# Patient Record
Sex: Female | Born: 1940 | Race: White | Hispanic: No | Marital: Married | Smoking: Former smoker
Health system: Southern US, Community
[De-identification: ages and names within clinical notes are randomized; demographics above are authoritative.]

## PROBLEM LIST (undated history)

## (undated) DIAGNOSIS — M069 Rheumatoid arthritis, unspecified: Secondary | ICD-10-CM

## (undated) DIAGNOSIS — M19042 Primary osteoarthritis, left hand: Secondary | ICD-10-CM

## (undated) DIAGNOSIS — M8580 Other specified disorders of bone density and structure, unspecified site: Secondary | ICD-10-CM

## (undated) DIAGNOSIS — M19041 Primary osteoarthritis, right hand: Secondary | ICD-10-CM

## (undated) DIAGNOSIS — M810 Age-related osteoporosis without current pathological fracture: Secondary | ICD-10-CM

## (undated) HISTORY — DX: Primary osteoarthritis, left hand: M19.042

## (undated) HISTORY — DX: Primary osteoarthritis, right hand: M19.041

## (undated) HISTORY — DX: Rheumatoid arthritis, unspecified: M06.9

## (undated) HISTORY — PX: CHOLECYSTECTOMY: SHX55

## (undated) HISTORY — DX: Age-related osteoporosis without current pathological fracture: M81.0

## (undated) HISTORY — DX: Other specified disorders of bone density and structure, unspecified site: M85.80

---

## 2005-10-18 ENCOUNTER — Encounter: Admission: RE | Admit: 2005-10-18 | Discharge: 2005-10-18 | Payer: Self-pay | Admitting: Rheumatology

## 2007-04-03 IMAGING — CR DG CHEST 2V
2 series · 2 of 2 positions shown · non-contrast
Comparison: none

CLINICAL DATA: Short of breath.  Rheumatoid arthritis.  Assess for possible tuberculosis.
 CHEST, TWO VIEWS:
 The heart and mediastinum are normal.  Lungs are clear.  No effusions.  No bony abnormalities.  No sign of old or active tuberculosis.

[w chest pa]
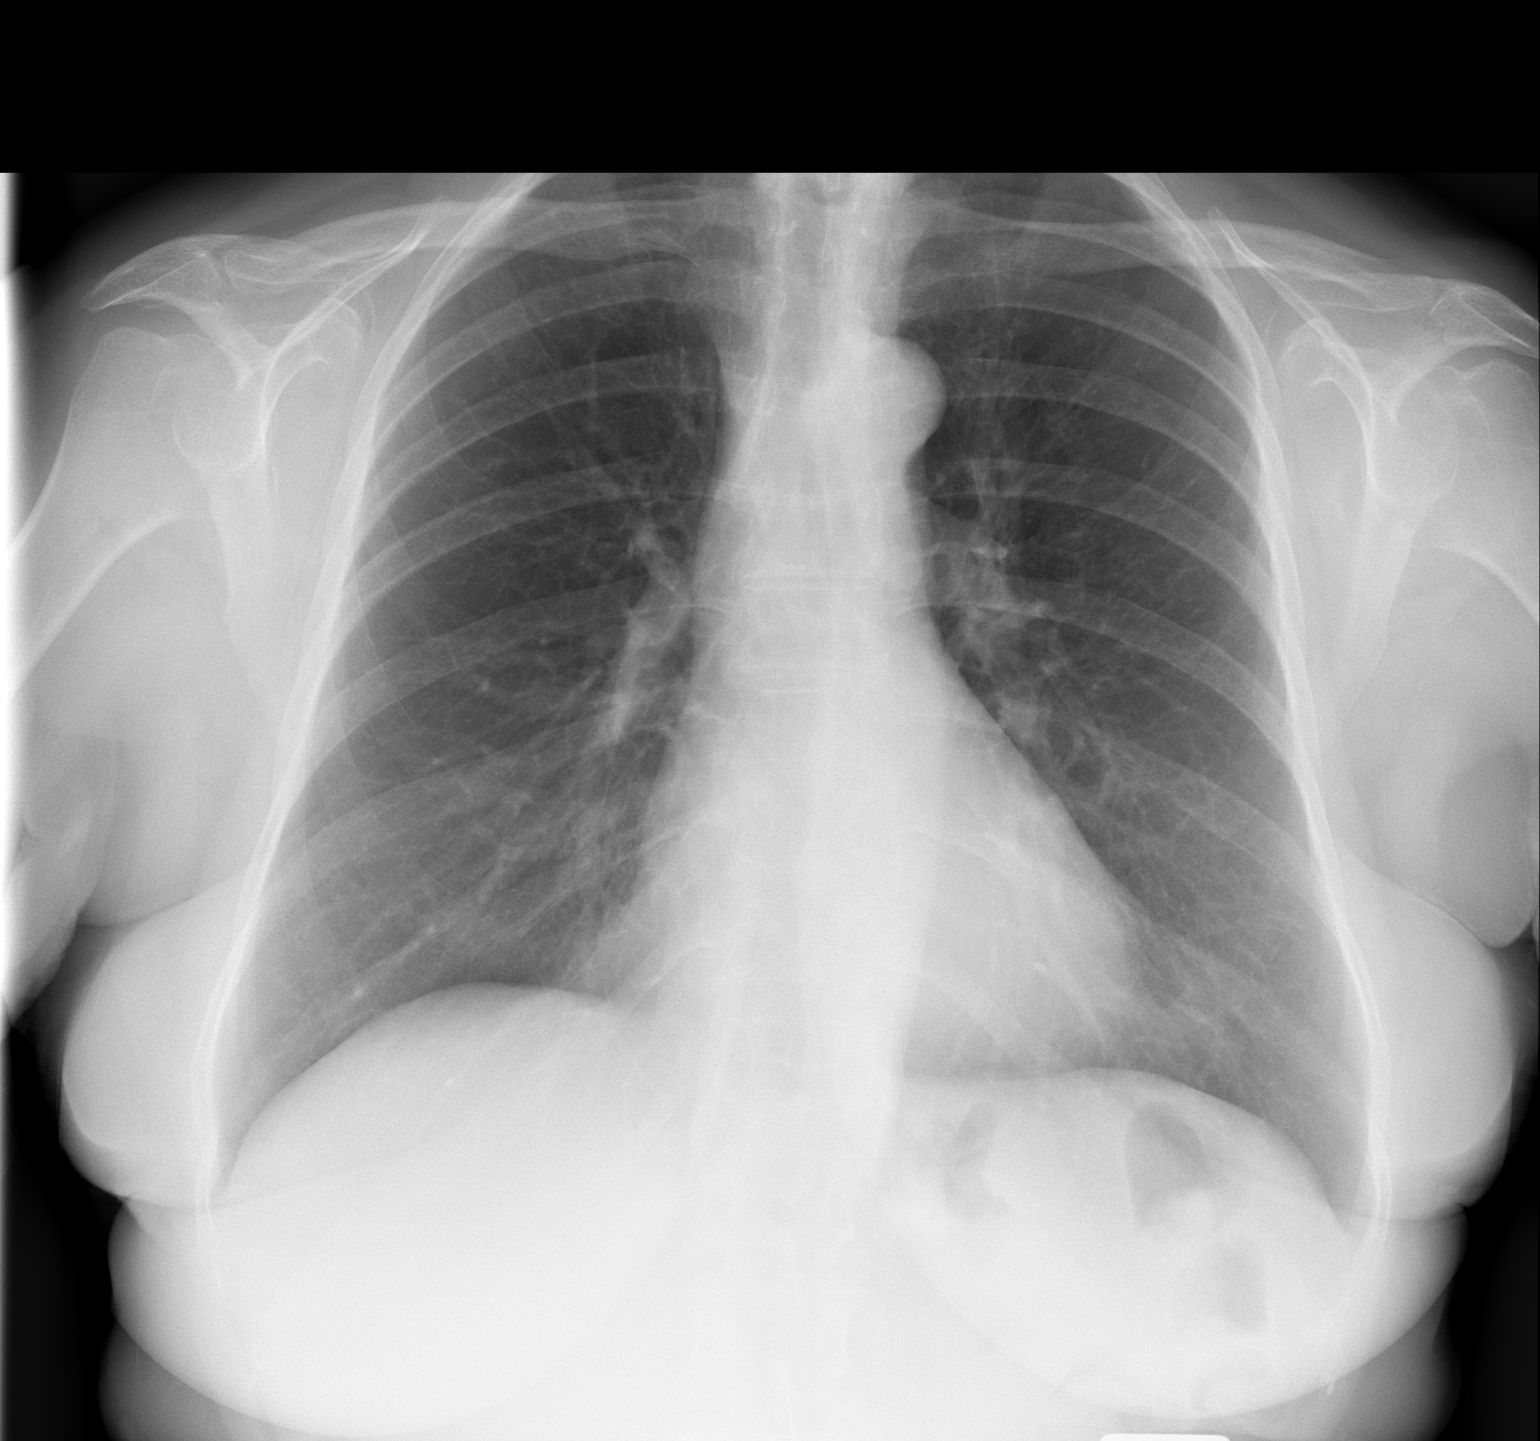

[w chest lat]
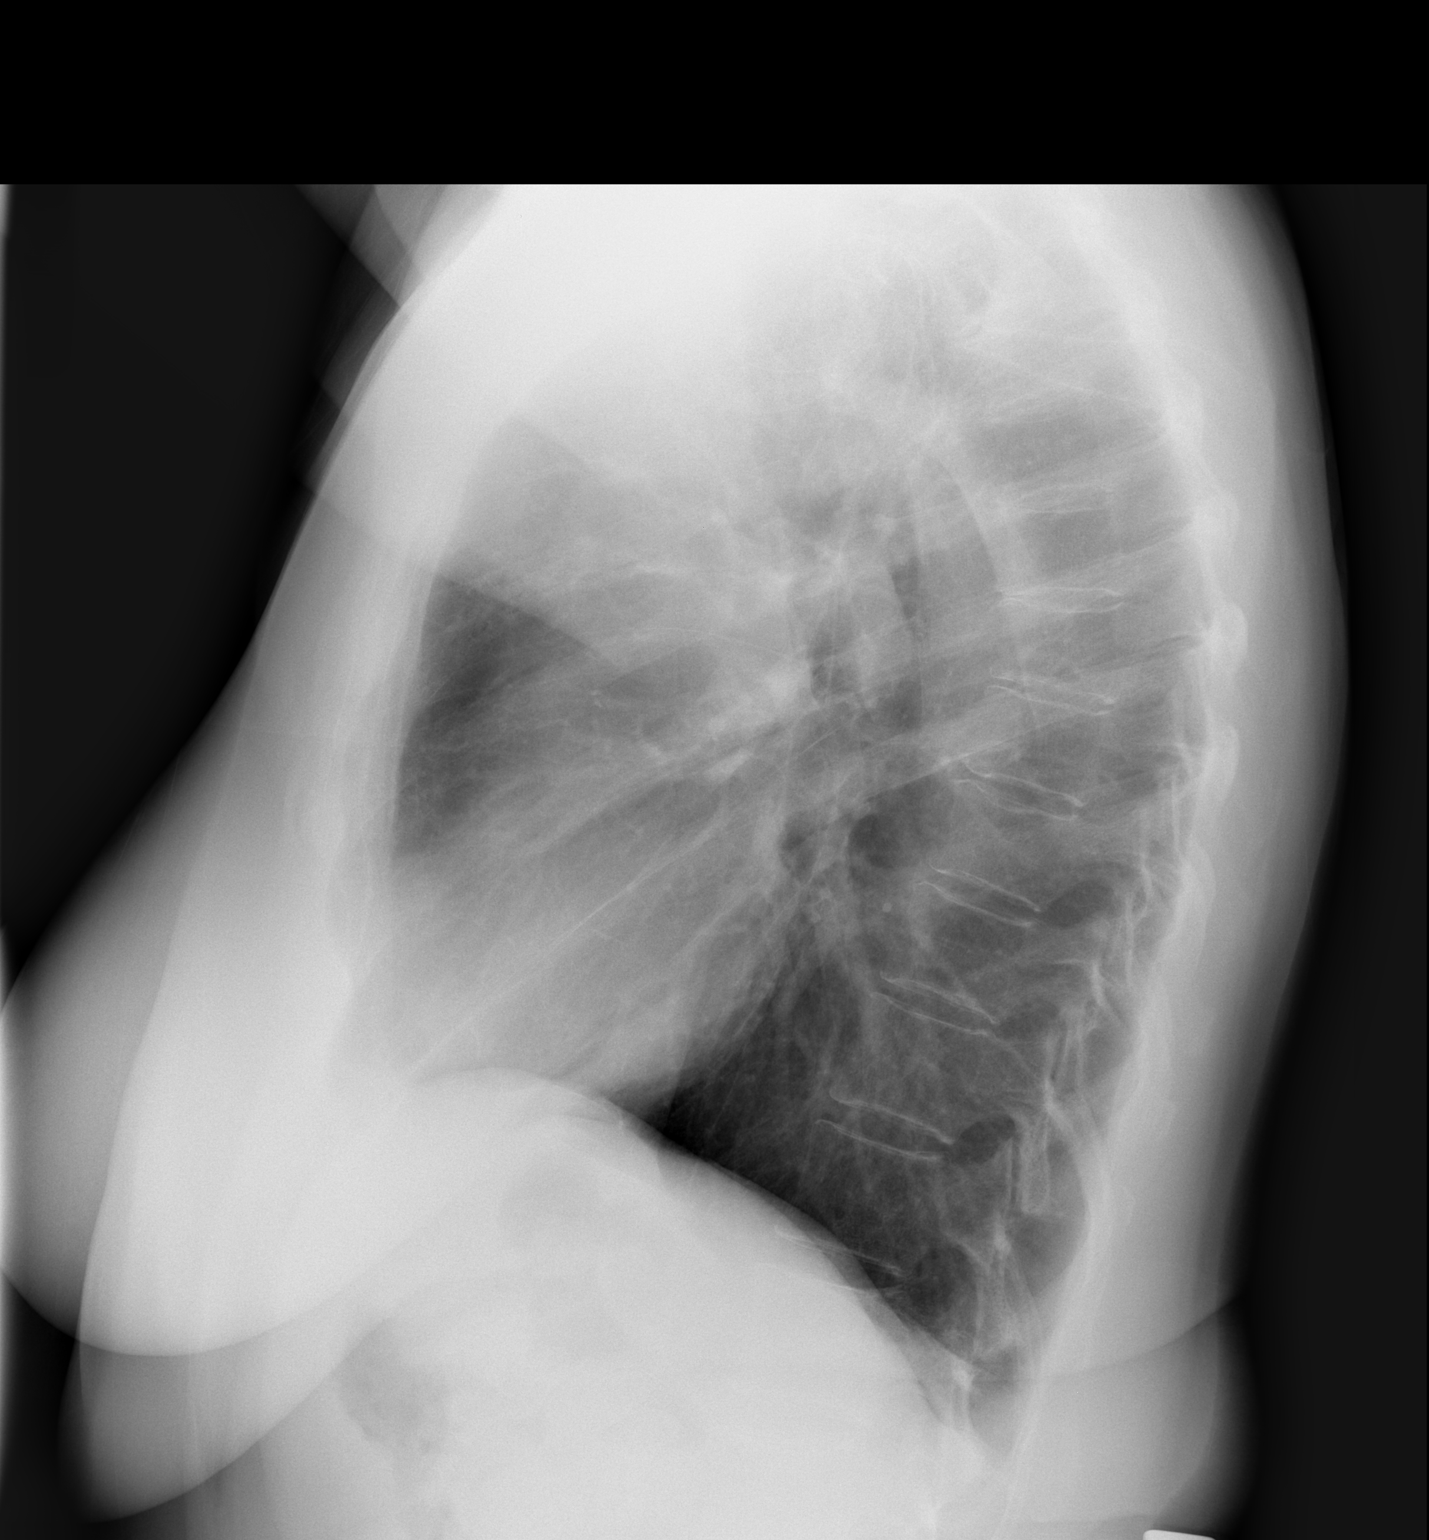

[2 of 2 positions shown; findings below may reference images not displayed]

IMPRESSION: Normal chest.

## 2016-02-20 LAB — HEPATIC FUNCTION PANEL
ALT: 21 U/L (ref 7–35)
AST: 28 U/L (ref 13–35)
Alkaline Phosphatase: 70 U/L (ref 25–125)
Bilirubin, Total: 0.4 mg/dL

## 2016-02-20 LAB — BASIC METABOLIC PANEL
BUN: 13 mg/dL (ref 4–21)
Creatinine: 0.9 mg/dL (ref 0.5–1.1)
Glucose: 95 mg/dL
Potassium: 4.6 mmol/L (ref 3.4–5.3)
Sodium: 138 mmol/L (ref 137–147)

## 2016-05-13 ENCOUNTER — Encounter: Payer: Self-pay | Admitting: *Deleted

## 2016-05-13 DIAGNOSIS — M19042 Primary osteoarthritis, left hand: Secondary | ICD-10-CM

## 2016-05-13 DIAGNOSIS — M8580 Other specified disorders of bone density and structure, unspecified site: Secondary | ICD-10-CM

## 2016-05-13 DIAGNOSIS — M19041 Primary osteoarthritis, right hand: Secondary | ICD-10-CM | POA: Insufficient documentation

## 2016-05-13 DIAGNOSIS — M069 Rheumatoid arthritis, unspecified: Secondary | ICD-10-CM

## 2016-05-13 HISTORY — DX: Primary osteoarthritis, right hand: M19.041

## 2016-05-13 HISTORY — DX: Other specified disorders of bone density and structure, unspecified site: M85.80

## 2016-05-13 HISTORY — DX: Primary osteoarthritis, left hand: M19.042

## 2016-05-13 HISTORY — DX: Rheumatoid arthritis, unspecified: M06.9

## 2016-05-14 ENCOUNTER — Ambulatory Visit (INDEPENDENT_AMBULATORY_CARE_PROVIDER_SITE_OTHER): Payer: MEDICARE | Admitting: Rheumatology

## 2016-05-14 ENCOUNTER — Encounter: Payer: Self-pay | Admitting: Rheumatology

## 2016-05-14 VITALS — BP 143/70 | HR 72 | Resp 14 | Ht 63.0 in | Wt 150.0 lb

## 2016-05-14 DIAGNOSIS — M06041 Rheumatoid arthritis without rheumatoid factor, right hand: Secondary | ICD-10-CM

## 2016-05-14 DIAGNOSIS — M06042 Rheumatoid arthritis without rheumatoid factor, left hand: Secondary | ICD-10-CM

## 2016-05-14 DIAGNOSIS — M19079 Primary osteoarthritis, unspecified ankle and foot: Secondary | ICD-10-CM | POA: Diagnosis not present

## 2016-05-14 DIAGNOSIS — M65341 Trigger finger, right ring finger: Secondary | ICD-10-CM

## 2016-05-14 DIAGNOSIS — M19042 Primary osteoarthritis, left hand: Secondary | ICD-10-CM | POA: Diagnosis not present

## 2016-05-14 DIAGNOSIS — M19041 Primary osteoarthritis, right hand: Secondary | ICD-10-CM | POA: Diagnosis not present

## 2016-05-14 DIAGNOSIS — Z79899 Other long term (current) drug therapy: Secondary | ICD-10-CM

## 2016-05-14 LAB — CBC WITH DIFFERENTIAL/PLATELET
Basophils Absolute: 62 cells/uL (ref 0–200)
Basophils Relative: 1 %
Eosinophils Absolute: 124 cells/uL (ref 15–500)
Eosinophils Relative: 2 %
HCT: 39.9 % (ref 35.0–45.0)
Hemoglobin: 13.5 g/dL (ref 11.7–15.5)
Lymphocytes Relative: 28 %
Lymphs Abs: 1736 cells/uL (ref 850–3900)
MCH: 32 pg (ref 27.0–33.0)
MCHC: 33.8 g/dL (ref 32.0–36.0)
MCV: 94.5 fL (ref 80.0–100.0)
MPV: 8.4 fL (ref 7.5–12.5)
Monocytes Absolute: 434 cells/uL (ref 200–950)
Monocytes Relative: 7 %
Neutro Abs: 3844 cells/uL (ref 1500–7800)
Neutrophils Relative %: 62 %
Platelets: 302 10*3/uL (ref 140–400)
RBC: 4.22 MIL/uL (ref 3.80–5.10)
RDW: 13.7 % (ref 11.0–15.0)
WBC: 6.2 10*3/uL (ref 3.8–10.8)

## 2016-05-14 LAB — COMPLETE METABOLIC PANEL WITH GFR
ALT: 23 U/L (ref 6–29)
AST: 29 U/L (ref 10–35)
Albumin: 4.5 g/dL (ref 3.6–5.1)
Alkaline Phosphatase: 59 U/L (ref 33–130)
BUN: 11 mg/dL (ref 7–25)
CO2: 28 mmol/L (ref 20–31)
Calcium: 10.2 mg/dL (ref 8.6–10.4)
Chloride: 100 mmol/L (ref 98–110)
Creat: 0.77 mg/dL (ref 0.60–0.93)
GFR, Est African American: 87 mL/min (ref >=60)
GFR, Est Non African American: 76 mL/min (ref >=60)
Glucose, Bld: 89 mg/dL (ref 65–99)
Potassium: 4.1 mmol/L (ref 3.5–5.3)
Sodium: 137 mmol/L (ref 135–146)
Total Bilirubin: 0.5 mg/dL (ref 0.2–1.2)
Total Protein: 6.9 g/dL (ref 6.1–8.1)

## 2016-05-14 MED ORDER — DICLOFENAC SODIUM 1 % TD GEL: TRANSDERMAL | 3 refills | Status: DC

## 2016-05-14 MED ORDER — METHOTREXATE 2.5 MG PO TABS: 20.0000 mg | ORAL_TABLET | ORAL | 2 refills | Status: DC

## 2016-05-14 MED ORDER — FOLIC ACID 1 MG PO TABS: 2.0000 mg | ORAL_TABLET | Freq: Every day | ORAL | 4 refills | Status: DC

## 2016-05-14 NOTE — Progress Notes (Signed)
Office Visit Note  Patient: April Shepard             Date of Birth: 1941/05/31           MRN: [id number]             PCP: Pcp Not In System Referring: No ref. provider found Visit Date: 05/14/2016 Occupation: @GUAROCC @    Subjective:  No chief complaint on file. Rheumatoid arthritis and high risk prescription and Alway of the hands and feet  History of Present Illness: April Shepard is a 75 y.o. female  Last seen 11/21/2015 Doing well with the rheumatoid arthritis Taking methotrexate 8 pills per week and folic acid 2 every day. 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 Having OA hands and feet stiffness. Has done well with osteopenia and is off of Fosamax Sees Dr. in 01/21/2016 to get her DEXA done. Her last labs from [facility] only had CMP. Patient knows that she had CBC with differential done and there is supposed to send [facility] a copy but we have not received it Line right trigger finger doing well after getting a cortisone injection by Dr. [place] the visit before last. But now it started to bother her. Patient was advised to use Voltaren gel.   Activities of Daily Living:  Patient reports morning stiffness for 15 minute.   Patient Denies nocturnal pain.  Difficulty dressing/grooming: Denies Difficulty climbing stairs: Denies Difficulty getting out of chair: Denies Difficulty using hands for taps, buttons, cutlery, and/or writing: Denies   Review of Systems  Constitutional: Negative for fatigue.  HENT: Negative for mouth sores and mouth dryness.   Eyes: Negative for dryness.  Respiratory: Negative for shortness of breath.   Gastrointestinal: Negative for constipation and diarrhea.  Musculoskeletal: Negative for myalgias and myalgias.  Skin: Negative for sensitivity to sunlight.  Psychiatric/Behavioral: Negative for decreased concentration and sleep disturbance.    PMFS History:  Patient Active Problem List   Diagnosis Date Noted  .  Osteopenia 05/13/2016  . Osteoarthritis of both hands 05/13/2016  . RA (rheumatoid arthritis) (HCC) 05/13/2016    Past Medical History:  Diagnosis Date  . Osteoarthritis of both hands 05/13/2016  . Osteopenia 05/13/2016  . RA (rheumatoid arthritis) (HCC) 05/13/2016    Family History  Problem Relation Age of Onset  . Breast cancer Mother   . Melanoma Brother    History reviewed. No pertinent surgical history. Social History   Social History Narrative  . No narrative on file     Objective: Vital Signs: BP (!) 143/70 (BP Location: Left Arm, Patient Position: Sitting, Cuff Size: Large)   Pulse 72   Resp 14   Ht 5\' 3"  (1.6 m)   Wt 150 lb (68 kg)   BMI 26.57 kg/m    Physical Exam  Constitutional: She is oriented to person, place, and time. She appears well-developed and well-nourished.  HENT:  Head: Normocephalic and atraumatic.  Eyes: EOM are normal. Pupils are equal, round, and reactive to light.  Cardiovascular: Normal rate, regular rhythm and normal heart sounds.  Exam reveals no gallop and no friction rub.   No murmur heard. Pulmonary/Chest: Effort normal and breath sounds normal. She has no wheezes. She has no rales.  Abdominal: Soft. Bowel sounds are normal. She exhibits no distension. There is no tenderness. There is no guarding. No hernia.  Musculoskeletal: Normal range of motion. She exhibits no edema, tenderness or deformity.  Lymphadenopathy:    She has no  cervical adenopathy.  Neurological: She is alert and oriented to person, place, and time. Coordination normal.  Skin: Skin is warm and dry. Capillary refill takes less than 2 seconds. No rash noted.  Psychiatric: She has a normal mood and affect. Her behavior is normal.     Musculoskeletal Exam:    CDAI Exam: CDAI Homunculus Exam:   Joint Counts:  CDAI Tender Joint count: 0 CDAI Swollen Joint count: 0  Global Assessments:  Patient Global Assessment: 3 Provider Global Assessment: 3  CDAI  Calculated Score: 6    Investigation: No additional findings. Labs from September 2017 from [facility] shows CMP with GFR normal. Patient did have CBC with differential done but we do not have a copy of those results despite our best efforts to get a copy.   Imaging: No results found.  Speciality Comments: No specialty comments available.    Procedures:  No procedures performed Allergies: Patient has no known allergies.   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 DISCOMFORT===> WILL USE VOLTAREN GEL PRN.  Refill methotrexate 8 per week 32 pills with 2 refills Refill folic acid 2 mg daily ninety-day supply with 4 refills CBC with differential CMP with GFR every 3 months starting for break 2018 We did labs in our office today.  Orders: Orders Placed This Encounter  Procedures  . CBC with Differential/Platelet  . COMPLETE METABOLIC PANEL WITH GFR   Meds ordered this encounter  Medications  . diclofenac sodium (VOLTAREN) 1 % GEL    Sig: Voltaren Gel 3 grams to 3 large joints upto TID 3 TUBES with 3 refills    Dispense:  3 Tube    Refill:  3    Order Specific Question:   Supervising Provider    Answer:   Pollyann Savoy [[id number]]  . methotrexate (RHEUMATREX) 2.5 MG tablet    Sig: Take 8 tablets (20 mg total) by mouth once a week. Caution:Chemotherapy. Protect from light.    Dispense:  32 tablet    Refill:  2    Order Specific Question:   Supervising Provider    Answer:   Pollyann Savoy [[id number]]  . folic acid (FOLVITE) 1 MG tablet    Sig: Take 2 tablets (2 mg total)  by mouth daily.    Dispense:  180 tablet    Refill:  4    Order Specific Question:   Supervising Provider    Answer:   Pollyann Savoy [phone]    Face-to-face time spent with patient was 30 minutes. 50% of time was spent in counseling and coordination of care.  Follow-Up Instructions: Return in about 5 months (around 10/12/2016) for RA, HRRX, MTX, OFF PLQ,,.   Chauncey Bruno, PA-C I examined and evaluated the patient with Tawni Pummel PA. The plan of care was discussed as noted above.  Pollyann Savoy, MD

## 2016-05-15 ENCOUNTER — Telehealth: Payer: Self-pay | Admitting: Radiology

## 2016-05-15 NOTE — Progress Notes (Signed)
#  1 CMP with GFR is normal#2 CBC with differential is normalNo change in treatment. Please advise patient.

## 2016-05-15 NOTE — Telephone Encounter (Signed)
I have called patient to advise labs are normal  

## 2016-06-06 ENCOUNTER — Telehealth: Payer: Self-pay | Admitting: Rheumatology

## 2016-06-06 MED ORDER — PREDNISONE 5 MG PO TABS: ORAL_TABLET | ORAL | 0 refills | Status: DC

## 2016-06-06 NOTE — Telephone Encounter (Signed)
Advised patient prescription sent to the pharmacy.

## 2016-06-06 NOTE — Telephone Encounter (Signed)
Patient states she is having swelling and soreness in her knee and ankle. Patient is requesting a prescription for prednisone.

## 2016-06-06 NOTE — Telephone Encounter (Signed)
Prednisone 5 mg tablets, 4 for 2 days 3 for 2 days 2 for 2 days 1 for 2 days

## 2016-06-06 NOTE — Addendum Note (Signed)
Addended by: Henriette Combs on: 06/06/2016 01:22 PM   Modules accepted: Orders

## 2016-06-06 NOTE — Telephone Encounter (Signed)
Patient is requesting rx of Prednisone be sent to [facility] in [place]. She states she is having some swelling so she is requesting prednisone.

## 2016-08-20 ENCOUNTER — Encounter: Payer: Self-pay | Admitting: Rheumatology

## 2016-08-22 ENCOUNTER — Telehealth: Payer: Self-pay | Admitting: Rheumatology

## 2016-08-22 NOTE — Telephone Encounter (Signed)
Patient advised lab results have not been received yet. Patient is going to have them refaxed, Patient provided with fax number again.

## 2016-08-22 NOTE — Telephone Encounter (Signed)
Patient would like to know if we received labs from [facility] in [place]? Patient had them faxed over and is requesting a call back please. Thanks.

## 2016-08-28 ENCOUNTER — Telehealth: Payer: Self-pay | Admitting: Rheumatology

## 2016-08-28 NOTE — Telephone Encounter (Signed)
Patient called wanting to know if we have received her lab results.  OF#[phone].  Thank you

## 2016-08-28 NOTE — Telephone Encounter (Signed)
Patient advised CBC and CMP drawn on 07/25/16 are WNL. Lipid panel show slightly elevated HDL and slightly elevated total cholesterol. Patient verbalized understanding.

## 2016-09-05 ENCOUNTER — Telehealth: Payer: Self-pay | Admitting: Rheumatology

## 2016-09-05 NOTE — Telephone Encounter (Signed)
OK to Prescribe Prednisone 5mg:  4po qAM x 3 days,  3po qAM x 3 days, 2po qAM x 3 days, 1po qAM x 3 days, 1/2po qAM x 3 days, then stop. disp 32 pills w/ no refills.

## 2016-09-05 NOTE — Telephone Encounter (Signed)
Patient states she is having swelling in her right foot. Patient states there is no pain. Patient states the swelling has been there for about 1.5 weeks. Patient is requesting a prescription for prednisone.

## 2016-09-05 NOTE — Telephone Encounter (Signed)
Patient says the prednisone helped the swelling in her right foot but now the swelling is back. She is requesting a prednisone rx be sent to [facility] pharmacy in [place].

## 2016-09-06 MED ORDER — PREDNISONE 5 MG PO TABS: ORAL_TABLET | ORAL | 0 refills | Status: DC

## 2016-09-06 NOTE — Telephone Encounter (Signed)
Left message to advise patient prescription sent to the pharmacy.  °

## 2016-09-23 ENCOUNTER — Other Ambulatory Visit: Payer: Self-pay | Admitting: Rheumatology

## 2016-09-23 NOTE — Telephone Encounter (Signed)
Last Visit: 05/14/16 Next Visit: 10/23/16 Labs: 07/25/16 WNL  Okay to refill MTX?

## 2016-10-22 DIAGNOSIS — Z79899 Other long term (current) drug therapy: Secondary | ICD-10-CM | POA: Insufficient documentation

## 2016-10-22 DIAGNOSIS — T372X5A Adverse effect of antimalarials and drugs acting on other blood protozoa, initial encounter: Secondary | ICD-10-CM

## 2016-10-22 DIAGNOSIS — H35389 Toxic maculopathy, unspecified eye: Secondary | ICD-10-CM | POA: Insufficient documentation

## 2016-10-22 DIAGNOSIS — M65341 Trigger finger, right ring finger: Secondary | ICD-10-CM | POA: Insufficient documentation

## 2016-10-22 NOTE — Progress Notes (Signed)
Office Visit Note  Patient: April Shepard             Date of Birth: 1940/12/15           MRN: [id number]             PCP: System, Pcp Not In Referring: No ref. provider found Visit Date: 10/23/2016 Occupation: '@GUAROCC' @    Subjective:  Pain and swelling in right foot   History of Present Illness: April Shepard is a 76 y.o. female with history of rheumatoid arthritis. According to her she had an episode of increased pain and swelling in her right foot joint in January for which she had to take a course of prednisone. She states the swelling responded to the course of prednisone. She had another episode in March for which also she got prednisone and the symptoms improved. She's been off prednisone for a month now. She states she still have some residual swelling in her right foot. None of the other joints have been swollen.  Activities of Daily Living:  Patient reports morning stiffness for 30 minutes.   Patient Denies nocturnal pain.  Difficulty dressing/grooming: Denies Difficulty climbing stairs: Denies Difficulty getting out of chair: Denies Difficulty using hands for taps, buttons, cutlery, and/or writing: Denies   Review of Systems  Constitutional: Positive for fatigue. Negative for night sweats, weight gain, weight loss and weakness.  HENT: Negative for mouth sores, trouble swallowing, trouble swallowing, mouth dryness and nose dryness.   Eyes: Negative for pain, redness, visual disturbance and dryness.  Respiratory: Negative for cough, shortness of breath and difficulty breathing.   Cardiovascular: Negative for chest pain, palpitations, hypertension, irregular heartbeat and swelling in legs/feet.  Gastrointestinal: Negative for blood in stool, constipation and diarrhea.  Endocrine: Negative for increased urination.  Genitourinary: Negative for vaginal dryness.  Musculoskeletal: Positive for arthralgias, joint pain and joint swelling. Negative for myalgias,  muscle weakness, morning stiffness, muscle tenderness and myalgias.  Skin: Negative for color change, rash, hair loss, skin tightness, ulcers and sensitivity to sunlight.  Allergic/Immunologic: Negative for susceptible to infections.  Neurological: Negative for dizziness, memory loss and night sweats.  Hematological: Negative for swollen glands.  Psychiatric/Behavioral: Negative for depressed mood and sleep disturbance. The patient is not nervous/anxious.     PMFS History:  Patient Active Problem List   Diagnosis Date Noted  . 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 High risk medication use 10/22/2016  . Trigger finger, right ring finger 10/22/2016  . Osteopenia 05/13/2016  . Osteoarthritis of both hands 05/13/2016  . RA (rheumatoid arthritis) ([facility]) 05/13/2016    Past Medical History:  Diagnosis Date  . Osteoarthritis of both hands 05/13/2016  . Osteopenia 05/13/2016  . RA (rheumatoid arthritis) ([place]) 05/13/2016    Family History  Problem Relation Age of Onset  . Breast cancer Mother   . Melanoma Brother    No past surgical history on file. Social History   Social History Narrative  . No narrative on file     Objective: Vital Signs: BP (!) 165/84 (BP Location: Left Arm, Patient Position: Sitting, Cuff Size: Normal)   Pulse 82   Resp 16   Ht '5\' 3"'  (1.6 m)   Wt 136 lb (61.7 kg)   BMI 24.09 kg/m    Physical Exam  Constitutional: She is oriented to person, place, and time. She appears well-developed and well-nourished.  HENT:  Head: Normocephalic and atraumatic.  Eyes: Conjunctivae and EOM are normal.  Neck: Normal range of motion.  Cardiovascular: Normal rate, regular rhythm, normal heart sounds and intact distal pulses.   Pulmonary/Chest: Effort normal and breath sounds normal.  Abdominal: Soft. Bowel sounds are normal.  Lymphadenopathy:    She has no cervical adenopathy.  Neurological: She is alert and oriented to person, place, and  time.  Skin: Skin is warm and dry. Capillary refill takes less than 2 seconds.  Psychiatric: She has a normal mood and affect. Her behavior is normal.  Nursing note and vitals reviewed.    Musculoskeletal Exam: C-spine and thoracic lumbar spine good range of motion. Shoulder joints elbow joints wrist joints are good range of motion. She synovitis over her MCP joints as described below. She also has right ankle swelling and warmth on palpation.  CDAI Exam: CDAI Homunculus Exam:   Tenderness:  Right hand: 3rd MCP Left hand: 2nd MCP RLE: tibiotalar  Swelling:  Right hand: 3rd MCP Left hand: 2nd MCP RLE: tibiotalar  Joint Counts:  CDAI Tender Joint count: 2 CDAI Swollen Joint count: 2  Global Assessments:  Patient Global Assessment: 2 Provider Global Assessment: 2  CDAI Calculated Score: 8    Investigation: Findings:  CBC and CMP drawn on 07/25/16 are WNL. 08/20/2016 CBC normal CMP normal     Imaging: No results found.  Speciality Comments: No specialty comments available.    Procedures:  No procedures performed Allergies: Patient has no known allergies.   Assessment / Plan:     Visit Diagnoses: Rheumatoid arthritis of multiple sites with negative rheumatoid factor [facility]): Patient is having recurrent flares since she's been off Plaquenil. Today she has synovitis in her hands and her right ankle joint. She has had 2 courses of prednisone since January. We had detailed discussion regarding different treatment options and their side effects. Patient was in agreement to proceed with subcutaneous methotrexate. We'll start her on 0.8 ML subcutaneous every week along with folic acid. She will have labs in 2 weeks, 2 months and then every 3 months of labs are stable. She also requested a prednisone taper for ongoing pain and swelling. Side effects were reviewed and a prednisone taper was given as a bridging therapy.  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 CBC with Differential/Platelet  . COMPLETE METABOLIC PANEL WITH GFR   Meds ordered this encounter  Medications  . Tuberculin-Allergy Syringes 27G X 1/2" 1 ML KIT    Sig: Inject 1 Syringe into the skin once a week. To be used with weekly methotrexate injections.    Dispense:  12 each    Refill:  3  . methotrexate 50 MG/2ML injection    Sig: Inject 0.8 mLs (20 mg total) into the skin once a week.    Dispense:  4 mL    Refill:  2  . predniSONE (DELTASONE) 5 MG tablet    Sig: 4 tablets by mouth for 2 days, 3 tablets by mouth for 2 days, 2 tablets by mouth for 2 days, 1 tablet by mouth for 2 days    Dispense:  20 tablet    Refill:  0    Face-to-face time spent with  patient was 40 minutes. 50% of time was spent in counseling and coordination of care.  Follow-Up Instructions: Return in about 3 months (around 01/23/2017) for Rheumatoid arthritis.   Bo Merino, MD  Note - This record has been created using Editor, commissioning.  Chart creation errors have been sought, but may not always  have been located. Such creation errors do not reflect on  the standard of medical care.

## 2016-10-23 ENCOUNTER — Encounter: Payer: Self-pay | Admitting: Rheumatology

## 2016-10-23 ENCOUNTER — Ambulatory Visit (INDEPENDENT_AMBULATORY_CARE_PROVIDER_SITE_OTHER): Payer: MEDICARE | Admitting: Rheumatology

## 2016-10-23 VITALS — BP 165/84 | HR 82 | Resp 16 | Ht 63.0 in | Wt 136.0 lb

## 2016-10-23 DIAGNOSIS — M0609 Rheumatoid arthritis without rheumatoid factor, multiple sites: Secondary | ICD-10-CM

## 2016-10-23 DIAGNOSIS — H35389 Toxic maculopathy, unspecified eye: Secondary | ICD-10-CM

## 2016-10-23 DIAGNOSIS — Z79899 Other long term (current) drug therapy: Secondary | ICD-10-CM | POA: Diagnosis not present

## 2016-10-23 DIAGNOSIS — T372X5A Adverse effect of antimalarials and drugs acting on other blood protozoa, initial encounter: Secondary | ICD-10-CM

## 2016-10-23 DIAGNOSIS — M19041 Primary osteoarthritis, right hand: Secondary | ICD-10-CM

## 2016-10-23 DIAGNOSIS — M8589 Other specified disorders of bone density and structure, multiple sites: Secondary | ICD-10-CM

## 2016-10-23 DIAGNOSIS — M19042 Primary osteoarthritis, left hand: Secondary | ICD-10-CM

## 2016-10-23 MED ORDER — PREDNISONE 5 MG PO TABS: ORAL_TABLET | ORAL | 0 refills | Status: DC

## 2016-10-23 MED ORDER — METHOTREXATE SODIUM CHEMO INJECTION 50 MG/2ML: 20.0000 mg | INTRAMUSCULAR | 2 refills | Status: DC

## 2016-10-23 MED ORDER — "TUBERCULIN-ALLERGY SYRINGES 27G X 1/2"" 1 ML KIT": 1.0000 | PACK | 3 refills | Status: DC

## 2016-10-23 NOTE — Patient Instructions (Addendum)
Standing Labs We placed an order today for your standing lab work.    Please come back and get your standing labs in 2 weeks, then in 2 months.  We have open lab Monday through Friday from 8:30-11:30 AM and 1:30-4 PM at the office of Dr. Arbutus Ped, PA.   The office is located at [address], [place], [place] [address] No appointment is necessary.   Labs are drawn by [employer].  You may receive a bill from [facility] for your lab work.     Leflunomide tablets What is this medicine? LEFLUNOMIDE (le FLOO na mide) is for rheumatoid arthritis. This medicine may be used for other purposes; ask your health care provider or pharmacist if you have questions. COMMON BRAND NAME(S): Arava What should I tell my health care provider before I take this medicine? They need to know if you have any of these conditions: -alcoholism -bone marrow problems -fever or infection -immune system problems -kidney disease -liver disease -an unusual or allergic reaction to leflunomide, teriflunomide, other medicines, lactose, foods, dyes, or preservatives -pregnant or trying to get pregnant -breast-feeding How should I use this medicine? Take this medicine by mouth with a full glass of water. Follow the directions on the prescription label. Take your medicine at regular intervals. Do not take your medicine more often than directed. Do not stop taking except on your doctor's advice. Talk to your pediatrician regarding the use of this medicine in children. Special care may be needed. Overdosage: If you think you have taken too much of this medicine contact a poison control center or emergency room at once. NOTE: This medicine is only for you. Do not share this medicine with others. What if I miss a dose? If you miss a dose, take it as soon as you can. If it is almost time for your next dose, take only that dose. Do not take double or extra doses. What may interact with this  medicine? Do not take this medicine with any of the following medications: -teriflunomide This medicine may also interact with the following medications: -charcoal -cholestyramine -methotrexate -NSAIDs, medicines for pain and inflammation, like ibuprofen or naproxen -phenytoin -rifampin -tolbutamide -vaccines -warfarin This list may not describe all possible interactions. Give your health care provider a list of all the medicines, herbs, non-prescription drugs, or dietary supplements you use. Also tell them if you smoke, drink alcohol, or use illegal drugs. Some items may interact with your medicine. What should I watch for while using this medicine? Visit your doctor or health care professional for regular checks on your progress. You will need frequent blood checks while you are receiving the medicine. If you get a cold or other infection while receiving this medicine, call your doctor or health care professional. Do not treat yourself. The medicine may increase your risk of getting an infection. If you are a woman who has the potential to become pregnant, discuss birth control options with your doctor or health care professional. Bonita Quin must not be pregnant, and you must be using a reliable form of birth control. The medicine may harm an unborn baby. Immediately call your doctor if you think you might be pregnant. Alcoholic drinks may increase possible damage to your liver. Do not drink alcohol while taking this medicine. What side effects may I notice from receiving this medicine? Side effects that you should report to your doctor or health care professional as soon as possible: -allergic reactions like skin rash, itching or hives,  swelling of the face, lips, or tongue -cough -difficulty breathing or shortness of breath -fever, chills or any other sign of infection -redness, blistering, peeling or loosening of the skin, including inside the mouth -unusual bleeding or bruising -unusually  weak or tired -vomiting -yellowing of eyes or skin Side effects that usually do not require medical attention (report to your doctor or health care professional if they continue or are bothersome): -diarrhea -hair loss -headache -nausea This list may not describe all possible side effects. Call your doctor for medical advice about side effects. You may report side effects to FDA at 1-800-FDA-1088. Where should I keep my medicine? Keep out of the reach of children. Store at room temperature between 15 and 30 degrees C (59 and 86 degrees F). Protect from moisture and light. Throw away any unused medicine after the expiration date. NOTE: This sheet is a summary. It may not cover all possible information. If you have questions about this medicine, talk to your doctor, pharmacist, or health care provider.  2018 Elsevier/Gold Standard (2013-06-01 10:53:11)

## 2016-10-23 NOTE — Progress Notes (Signed)
Pharmacy Note  Subjective: Patient presents today to the [facility] to see Dr. Corliss Skains.  I was asked to talk to patient about leflunomide or injectable methotrexate.    Objective: CBC    Component Value Date/Time   WBC 6.2 05/14/2016 1314   RBC 4.22 05/14/2016 1314   HGB 13.5 05/14/2016 1314   HCT 39.9 05/14/2016 1314   PLT 302 05/14/2016 1314   MCV 94.5 05/14/2016 1314   MCH 32.0 05/14/2016 1314   MCHC 33.8 05/14/2016 1314   RDW 13.7 05/14/2016 1314   LYMPHSABS 1,736 05/14/2016 1314   MONOABS 434 05/14/2016 1314   EOSABS 124 05/14/2016 1314   BASOSABS 62 05/14/2016 1314   CMP     Component Value Date/Time   NA 137 05/14/2016 1314   NA 138 02/20/2016   K 4.1 05/14/2016 1314   CL 100 05/14/2016 1314   CO2 28 05/14/2016 1314   GLUCOSE 89 05/14/2016 1314   BUN 11 05/14/2016 1314   BUN 13 02/20/2016   CREATININE 0.77 05/14/2016 1314   CALCIUM 10.2 05/14/2016 1314   PROT 6.9 05/14/2016 1314   ALBUMIN 4.5 05/14/2016 1314   AST 29 05/14/2016 1314   ALT 23 05/14/2016 1314   ALKPHOS 59 05/14/2016 1314   BILITOT 0.5 05/14/2016 1314   GFRNONAA 76 05/14/2016 1314   GFRAA 87 05/14/2016 1314   TB Gold: negative (10/01/12)  Pregnancy status:  Post-menopausal  Vitals:   10/23/16 1315  BP: (!) 165/84  Pulse: 82  Resp: 16   Home blood pressure readings: 122-143/65-83  Assessment/Plan: Patient was counseled on the purpose, proper use, and adverse effects of leflunomide including risk of infection, nausea/diarrhea/weight loss, increase in blood pressure, rash, hair loss, tingling in the hands and feet, and signs and symptoms of interstitial lung disease.  Discussed the importance of frequent monitoring of liver function and blood counts.  Provided patient with educational materials on leflunomide and answered all questions.   Also discussed injectable methotrexate.  Patient decided that she would prefer injectable methotrexate.  Educated patient on how to use  a vial and syringe and reviewed injection technique with patient.  Provided patient on educational material regarding injection technique and storage of methotrexate.  Conseled patient that methotrexate injections will replace oral methotrexate.  Advised patient to continue folic acid 2 mg daily.  Counseled patient to get labs in 2 weeks then 2 months.    Patient also requested prednisone taper.  Discussed with Dr. Corliss Skains and will send in short prednisone taper.  Counseled patient on purpose, proper use, and adverse effects of prednisone.   Lilla Shook, Pharm.D., BCPS Clinical Pharmacist Pager: [phone] Phone: [phone] 10/23/2016 1:39 PM

## 2016-11-07 ENCOUNTER — Telehealth: Payer: Self-pay | Admitting: Rheumatology

## 2016-11-07 NOTE — Telephone Encounter (Signed)
Patient advised we only have the CBC from the lab. patient asked for me to contact the lab to see if they could fax the CMP. Contacted the lab and spoke with Dondra Spry. Dondra Spry states she did not realize that there was an order for a CMP. She is going to call the patient and have her come back tomorrow for the [facility] and then fax the results.

## 2016-11-07 NOTE — Telephone Encounter (Signed)
Patient had labs drawn in [facility], and wants to make sure we received those. Please call patient to advise.

## 2016-11-08 ENCOUNTER — Encounter: Payer: Self-pay | Admitting: Rheumatology

## 2016-11-18 ENCOUNTER — Telehealth: Payer: Self-pay | Admitting: Rheumatology

## 2016-11-18 NOTE — Telephone Encounter (Signed)
Patient left a message wanting to know if we have received her labs, she had them done on May 25.  CB#[phone].  Thank you

## 2016-11-19 NOTE — Telephone Encounter (Signed)
I spoke with patient and discussed that she needs more aggressive therapy. She had to stop Plaquenil and she is on monotherapy with methotrexate. We can consider adding sulfasalazine or a biologic therapy. Patient stated that she would like to give it more time and she will schedule a follow-up appointment to change in treatment if she needs it.

## 2016-11-19 NOTE — Telephone Encounter (Signed)
Patient advised we did received her blood work and her CMP is normal. Patient states she finished her prednisone taper given at her last appointment. Patient states the swelling she as having at that time went away completely. Patient states the swelling is coming back in her ankles. Patient states it is tolerable and not much. Please advise.

## 2017-01-31 ENCOUNTER — Other Ambulatory Visit: Payer: Self-pay | Admitting: Rheumatology

## 2017-02-03 NOTE — Telephone Encounter (Addendum)
Called pt to advise labs needed for MTX she states she has had new labs which are normal   She was in hospital with C diff infection / will hold her Methotrexate until she is well and is off her antibiotics  She will have new labs on wed and will have them sent to [place].

## 2017-02-03 NOTE — Telephone Encounter (Signed)
10/23/16 last visit 05/16/17 next visit    Abnormal CMP scanned from hospital stay, July 2018, will call patient, she should have repeat and have CBC also

## 2017-02-05 ENCOUNTER — Ambulatory Visit: Payer: MEDICARE | Admitting: Rheumatology

## 2017-02-05 NOTE — Telephone Encounter (Signed)
CBC/ CMP 02/05/17 WNL  Okay to refill per Dr. Corliss Skains

## 2017-02-05 NOTE — Telephone Encounter (Signed)
Patient had a normal CBC 11/05/16

## 2017-02-10 NOTE — Progress Notes (Signed)
Office Visit Note  Patient: April Shepard             Date of Birth: 02/18/1941           MRN: [id number]             PCP: Colbert Coyer, MD Referring: No ref. provider found Visit Date: 02/13/2017 Occupation: @GUAROCC @    Subjective:  Joint pain.   History of Present Illness: April Shepard is a 76 y.o. female with history of rheumatoid arthritis. According to patient she was diagnosed with C. difficile diarrhea in July. She was started on antibiotic therapy. After one week of treatment treatment she stopped her medications and had recurrence of the area. She was restarted on antibiotics and is supposed to finish the course for 6 weeks. She still on antibiotics. She states she stopped methotrexate when she started antibiotics. She's been off methotrexate for almost 2 weeks now. She's having some joint pain but no joint swelling. She states her diarrhea hs this can s improved.  Activities of Daily Living:  Patient reports morning stiffness for 15 minutes.   Patient Denies nocturnal pain.  Difficulty dressing/grooming: Denies Difficulty climbing stairs: Denies Difficulty getting out of chair: Denies Difficulty using hands for taps, buttons, cutlery, and/or writing: Denies   Review of Systems  Constitutional: Negative for fatigue.  HENT: Positive for mouth sores. Negative for mouth dryness.   Eyes: Negative for dryness.  Respiratory: Positive for shortness of breath. Negative for cough.   Cardiovascular: Negative for swelling in legs/feet.  Gastrointestinal: Negative.  Negative for blood in stool, constipation and diarrhea.  Musculoskeletal: Positive for arthralgias, joint pain and joint swelling. Negative for myalgias, muscle weakness, morning stiffness, muscle tenderness and myalgias.  Skin: Negative.  Negative for rash, hair loss and sensitivity to sunlight.  Psychiatric/Behavioral: Negative.  Negative for depressed mood and sleep disturbance.    PMFS History:    Patient Active Problem List   Diagnosis Date Noted  . 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 High risk medication use 10/22/2016  . Trigger finger, right ring finger 10/22/2016  . Osteopenia 05/13/2016  . Osteoarthritis of both hands 05/13/2016  . RA (rheumatoid arthritis) (HCC) 05/13/2016    Past Medical History:  Diagnosis Date  . Osteoarthritis of both hands 05/13/2016  . Osteopenia 05/13/2016  . RA (rheumatoid arthritis) (HCC) 05/13/2016    Family History  Problem Relation Age of Onset  . Breast cancer Mother   . Heart attack Father   . Melanoma Brother    History reviewed. No pertinent surgical history. Social History   Social History Narrative  . No narrative on file     Objective: Vital Signs: BP (!) 146/87 (BP Location: Left Arm, Patient Position: Sitting, Cuff Size: Normal)   Pulse 69   Ht 5\' 3"  (1.6 m)   Wt 144 lb (65.3 kg)   BMI 25.51 kg/m    Physical Exam  Constitutional: She is oriented to person, place, and time. She appears well-developed and well-nourished.  HENT:  Head: Normocephalic and atraumatic.  Eyes: Conjunctivae and EOM are normal.  Neck: Normal range of motion.  Cardiovascular: Normal rate, regular rhythm, normal heart sounds and intact distal pulses.   Pulmonary/Chest: Effort normal and breath sounds normal.  Abdominal: Soft. Bowel sounds are normal.  Lymphadenopathy:    She has no cervical adenopathy.  Neurological: She is alert and oriented to person, place, and time.  Skin: Skin is warm and dry.  Capillary refill takes less than 2 seconds.  Psychiatric: She has a normal mood and affect. Her behavior is normal.  Nursing note and vitals reviewed.    Musculoskeletal Exam: C-spine and thoracic lumbar spine good range of motion. Shoulder joints elbow joints wrist joint MCPs PIPs DIPs with good range of motion. She is some synovial thickening but no synovitis. She has some DIP thickening in PIP thickening can due  to underlying osteoarthritis. Hip joints knee joints ankles MTPs PIPs DIPs are good range of motion with no synovitis.  CDAI Exam: CDAI Homunculus Exam:   Joint Counts:  CDAI Tender Joint count: 0 CDAI Swollen Joint count: 0  Global Assessments:  Patient Global Assessment: 2   CDAI Calculated Score: 2    Investigation: Findings:  CBC/ CMP 02/05/17 WNL    Imaging: No results found.  Speciality Comments: No specialty comments available.    Procedures:  No procedures performed Allergies: Patient has no known allergies.   Assessment / Plan:     Visit Diagnoses: Rheumatoid arthritis of multiple sites with negative rheumatoid factor (HCC) - patient has no synovitis on examination today. She's been off methotrexate for the last 2-3 weeks due to C. difficile infection.  High risk medication use -  Methotrexate 0.8 ML subcutaneous every week, folic acid 2 mg by mouth daily (switched to SQ MTX 10/2016 due to frequent flares) her labs in August were normal. We will continue to monitor labs every 3 months.  Primary osteoarthritis of both hands: She is some discomfort in her hands due to underlying osteoarthritis.  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  History of Clostridium difficile infection : She is on antibiotics currently. I've advised her to have repeat culture of her stool after completion of course of antibiotics if negative then she can resume methotrexate.   Orders: No orders of the defined types were placed in this encounter.  No orders of the defined types were placed in this encounter.   Follow-Up Instructions: Return in about 5 months (around 07/16/2017) for Rheumatoid arthritis, Osteoarthritis.   Pollyann Savoy, MD  Note - This record has been created using Animal nutritionist.  Chart creation errors have been sought, but may not always  have  been located. Such creation errors do not reflect on  the standard of medical care.

## 2017-02-13 ENCOUNTER — Ambulatory Visit (INDEPENDENT_AMBULATORY_CARE_PROVIDER_SITE_OTHER): Payer: MEDICARE | Admitting: Rheumatology

## 2017-02-13 ENCOUNTER — Encounter: Payer: Self-pay | Admitting: Rheumatology

## 2017-02-13 VITALS — BP 146/87 | HR 69 | Ht 63.0 in | Wt 144.0 lb

## 2017-02-13 DIAGNOSIS — M19042 Primary osteoarthritis, left hand: Secondary | ICD-10-CM

## 2017-02-13 DIAGNOSIS — M0609 Rheumatoid arthritis without rheumatoid factor, multiple sites: Secondary | ICD-10-CM | POA: Diagnosis not present

## 2017-02-13 DIAGNOSIS — Z79899 Other long term (current) drug therapy: Secondary | ICD-10-CM | POA: Diagnosis not present

## 2017-02-13 DIAGNOSIS — H35389 Toxic maculopathy, unspecified eye: Secondary | ICD-10-CM

## 2017-02-13 DIAGNOSIS — Z8619 Personal history of other infectious and parasitic diseases: Secondary | ICD-10-CM

## 2017-02-13 DIAGNOSIS — T372X5A Adverse effect of antimalarials and drugs acting on other blood protozoa, initial encounter: Secondary | ICD-10-CM | POA: Diagnosis not present

## 2017-02-13 DIAGNOSIS — M8589 Other specified disorders of bone density and structure, multiple sites: Secondary | ICD-10-CM

## 2017-02-13 DIAGNOSIS — M19041 Primary osteoarthritis, right hand: Secondary | ICD-10-CM

## 2017-02-13 NOTE — Patient Instructions (Addendum)
Standing Labs We placed an order today for your standing lab work.    Please come back and get your standing labs in November and every 3 months  We have open lab Monday through Friday from 8:30-11:30 AM and 1:30-4 PM at the office of Dr. Breon Rehm.   The office is located at [address], [address], [place], [place] [address] No appointment is necessary.   Labs are drawn by [facility].  You may receive a bill from [facility] for your lab work. If you have any questions regarding directions or hours of operation,  please call [phone].    

## 2017-03-03 ENCOUNTER — Telehealth: Payer: Self-pay | Admitting: Rheumatology

## 2017-03-03 NOTE — Telephone Encounter (Signed)
Patient requesting a rx for swelling with hands, knees, and feet. Patient started MTX back on the 15th. Patient requesting rx for swelling. Patient uses [facility] in [place]. Please call to advise patient. Per patient she really needs something, she is in a lot of pain.

## 2017-03-04 MED ORDER — PREDNISONE 5 MG PO TABS: ORAL_TABLET | ORAL | 0 refills | Status: DC

## 2017-03-04 NOTE — Telephone Encounter (Signed)
Pred 5 mg tab, 4x4d, 3x4d,2x4d,1x4d,1/2x1d

## 2017-03-04 NOTE — Telephone Encounter (Signed)
Patient states she is having swelling and painin both of her hands. Patient states she is having swelling in the third finger on left hand and states yesterday it was her whole hand. Patient states she is having swelling and pain in her knees and ankles and the tops of her feet. Patient had been off of her MTX for about 4 weeks due to being on antibiotics. Patient states she has been cleared by her PCP and is off antibiotics and restarted her MTX on 03/01/17. Patient is requesting a prescription for Prednisone. Please advise.

## 2017-03-04 NOTE — Telephone Encounter (Signed)
Prescription sent to the pharmacy and patient advised.  

## 2017-04-03 ENCOUNTER — Telehealth: Payer: Self-pay

## 2017-04-03 NOTE — Telephone Encounter (Signed)
Patient states the swelling in her hands, ankles and knees has been going on since two days ago Patient stated the last prednisone taper was 03/04/17 and finished 03/18/17, she was doing fine during that. Patient states she was off of MTX for about 4 weeks due to being on antibiotics but is currently back on MTX since 03/01/17. Patient states she believes that being off of MTX has caused the swelling she has been experiencing.

## 2017-04-03 NOTE — Telephone Encounter (Signed)
Patient would like to know if she should continue the Prednisone? Stated that she is having swelling in her hands, knees, and ankles and is very concerned,  Cb# is [phone].  Please advise.  Thank You.

## 2017-04-03 NOTE — Telephone Encounter (Signed)
Okay to give another prednisone taper decreasing dose by every 2 days.

## 2017-04-04 MED ORDER — PREDNISONE 5 MG PO TABS: ORAL_TABLET | ORAL | 0 refills | Status: DC

## 2017-04-04 NOTE — Telephone Encounter (Signed)
Prescription sent to the pharmacy. Patient advised.  

## 2017-04-04 NOTE — Addendum Note (Signed)
Addended by: Henriette Combs on: 04/04/2017 08:23 AM   Modules accepted: Orders

## 2017-04-12 ENCOUNTER — Other Ambulatory Visit: Payer: Self-pay | Admitting: Rheumatology

## 2017-04-14 NOTE — Telephone Encounter (Signed)
Last Visit: 02/13/17 Next Visit: 06/12/17 Labs: 02/13/17 WNL  Okay to refill per Dr. Corliss Skains

## 2017-05-16 ENCOUNTER — Other Ambulatory Visit: Payer: Self-pay | Admitting: Rheumatology

## 2017-05-18 ENCOUNTER — Other Ambulatory Visit: Payer: Self-pay | Admitting: Rheumatology

## 2017-05-19 NOTE — Telephone Encounter (Addendum)
Last Visit: 02/13/17 Next Visit: 06/12/17 Labs: 02/13/17 WNL  Left message to advise patient labs are due   Okay to refill 30 day supply per Dr. Corliss Skains

## 2017-05-19 NOTE — Telephone Encounter (Signed)
Last Visit: 02/13/17 Next Visit: 06/12/17   Okay to refill per Dr. Corliss Skains

## 2017-06-02 NOTE — Progress Notes (Signed)
Office Visit Note  Patient: April Shepard             Date of Birth: Dec 09, 1940           MRN: [id number]             PCP: Colbert Coyer, MD Referring: Colbert Coyer, MD Visit Date: 06/12/2017 Occupation: @GUAROCC @    Subjective:  Pain and swelling in left hand.   History of Present Illness: April Shepard is a 76 y.o. female with history of seronegative rheumatoid arthritis. She states she had to come off methotrexate this summer due to C. difficile infection. She resumed her methotrexate at the end of August. She had cholecystectomy in October. She did not stop her methotrexate for that. She states she's been having some discomfort in her joints. She started having pain and swelling in her left hand last night.  Activities of Daily Living:  Patient reports morning stiffness for 30 minutes.   Patient Reports nocturnal pain.  Difficulty dressing/grooming: Reports Difficulty climbing stairs: Denies Difficulty getting out of chair: Denies Difficulty using hands for taps, buttons, cutlery, and/or writing: Reports   Review of Systems  Constitutional: Positive for weakness. Negative for fatigue, night sweats, weight gain and weight loss.  HENT: Negative.  Negative for mouth sores, trouble swallowing, trouble swallowing, mouth dryness and nose dryness.   Eyes: Negative.  Negative for pain, redness, visual disturbance and dryness.  Respiratory: Negative.  Negative for cough, shortness of breath and difficulty breathing.   Cardiovascular: Negative.  Negative for chest pain, palpitations, hypertension, irregular heartbeat and swelling in legs/feet.  Gastrointestinal: Negative.  Negative for blood in stool, constipation and diarrhea.  Endocrine: Negative for increased urination.  Genitourinary: Negative for vaginal dryness.  Musculoskeletal: Positive for arthralgias, joint pain and morning stiffness. Negative for joint swelling, myalgias, muscle weakness, muscle  tenderness and myalgias.  Skin: Negative.  Negative for color change, rash, hair loss, skin tightness, ulcers and sensitivity to sunlight.  Allergic/Immunologic: Negative for susceptible to infections.  Neurological: Negative for dizziness, numbness, headaches, memory loss and night sweats.  Hematological: Negative for swollen glands.  Psychiatric/Behavioral: Negative.  Negative for depressed mood and sleep disturbance. The patient is not nervous/anxious.     PMFS History:  Patient Active Problem List   Diagnosis Date Noted  . 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 High risk medication use 10/22/2016  . Trigger finger, right ring finger 10/22/2016  . Osteopenia 05/13/2016  . Osteoarthritis of both hands 05/13/2016  . RA (rheumatoid arthritis) (HCC) 05/13/2016    Past Medical History:  Diagnosis Date  . Osteoarthritis of both hands 05/13/2016  . Osteopenia 05/13/2016  . RA (rheumatoid arthritis) (HCC) 05/13/2016    Family History  Problem Relation Age of Onset  . Breast cancer Mother   . Heart attack Father   . Melanoma Brother    No past surgical history on file. Social History   Social History Narrative  . Not on file     Objective: Vital Signs: BP (!) 157/89 (BP Location: Left Arm, Patient Position: Sitting, Cuff Size: Normal)   Pulse 81   Ht 5\' 3"  (1.6 m)   Wt 147 lb (66.7 kg)   BMI 26.04 kg/m    Physical Exam  Constitutional: She is oriented to person, place, and time. She appears well-developed and well-nourished.  HENT:  Head: Normocephalic and atraumatic.  Eyes: Conjunctivae and EOM are normal.  Neck: Normal range of motion.  Cardiovascular: Normal rate, regular rhythm, normal heart sounds and intact distal pulses.  Pulmonary/Chest: Effort normal and breath sounds normal.  Abdominal: Soft. Bowel sounds are normal.  Lymphadenopathy:    She has no cervical adenopathy.  Neurological: She is alert and oriented to person, place,  and time.  Skin: Skin is warm and dry. Capillary refill takes less than 2 seconds.  Psychiatric: She has a normal mood and affect. Her behavior is normal.  Nursing note and vitals reviewed.    Musculoskeletal Exam: C-spine and thoracic lumbar spine good range of motion. Shoulder joints elbow joints were in good range of motion. She has limited range of motion of bilateral wrist joints with synovial thickening. She has synovial thickening over bilateral MCP joints with synovitis in her left hand MCPs and PIPs as described below. Hip joints knee joints ankles MTPs PIPs with good range of motion with no synovitis.  CDAI Exam: CDAI Homunculus Exam:   Tenderness:  Left hand: 1st MCP, 2nd MCP, 3rd MCP, 2nd PIP and 3rd PIP  Swelling:  Left hand: 1st MCP, 2nd MCP, 3rd MCP, 2nd PIP and 3rd PIP  Joint Counts:  CDAI Tender Joint count: 5 CDAI Swollen Joint count: 5  Global Assessments:  Patient Global Assessment: 7 Provider Global Assessment: 7  CDAI Calculated Score: 24    Investigation: No additional findings. Labs: 06/05/2017 [facility] CBC with differential normal, CMP normal  Imaging: No results found.  Speciality Comments: No specialty comments available.    Procedures:  No procedures performed Allergies: Patient has no known allergies.   Assessment / Plan:     Visit Diagnoses: Rheumatoid arthritis of multiple sites with negative rheumatoid factor (HCC)-she is having a flare with increased pain and discomfort in her left hand. She has synovitis on examination today. She had been doing well on methotrexate current dose. She states she had some increased activity during Christmas time which caused the flare. I'll give her a prednisone taper as described below. Side effects of prednisone were reviewed.  High risk medication use - MTX 0.8 ml sq q wk, folic acid 2mg  po qd. (switched to SQ MTX 10/2016 due to frequent flares). Her labs have been  stable. We will continue to monitor her labs.  Primary osteoarthritis of both hands: She does have some underlying osteoarthritis which causes discomfort.  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  History of Clostridium difficile infection   Status post cholecystectomy November 2018   Orders: No orders of the defined types were placed in this encounter.  Meds ordered this encounter  Medications  . predniSONE (DELTASONE) 5 MG tablet    Sig: Take 20mg  a day for 4 days, then 15mg  a day for 4 days, then 10mg  a day for 4 days, then 5mg  a day for 4 days, then stop.    Dispense:  40 tablet    Refill:  0    Face-to-face time spent with patient was 30 minutes. Greater than 50% of time was spent in counseling and coordination of care.  Follow-Up Instructions: Return in about 5 months (around 11/10/2017) for Rheumatoid arthritis.   , MD  Note - This record has been created using .  Chart creation errors have been sought, but may not always  have been located. Such creation errors do not reflect on  the standard of medical care.

## 2017-06-03 ENCOUNTER — Telehealth: Payer: Self-pay

## 2017-06-03 ENCOUNTER — Other Ambulatory Visit: Payer: Self-pay | Admitting: *Deleted

## 2017-06-03 DIAGNOSIS — Z79899 Other long term (current) drug therapy: Secondary | ICD-10-CM

## 2017-06-03 NOTE — Telephone Encounter (Signed)
Lab Orders faxed

## 2017-06-03 NOTE — Telephone Encounter (Addendum)
Patient would like her order for her lab work to be faxed to Registration, attn: Stacy at [phone].  Stated that she is going on tomorrow. Would like for this to be done today. Please advise.  Thank you.

## 2017-06-12 ENCOUNTER — Ambulatory Visit (INDEPENDENT_AMBULATORY_CARE_PROVIDER_SITE_OTHER): Payer: MEDICARE | Admitting: Rheumatology

## 2017-06-12 ENCOUNTER — Encounter: Payer: Self-pay | Admitting: Rheumatology

## 2017-06-12 VITALS — BP 157/89 | HR 81 | Ht 63.0 in | Wt 147.0 lb

## 2017-06-12 DIAGNOSIS — M19041 Primary osteoarthritis, right hand: Secondary | ICD-10-CM

## 2017-06-12 DIAGNOSIS — T372X5A Adverse effect of antimalarials and drugs acting on other blood protozoa, initial encounter: Secondary | ICD-10-CM | POA: Diagnosis not present

## 2017-06-12 DIAGNOSIS — M0609 Rheumatoid arthritis without rheumatoid factor, multiple sites: Secondary | ICD-10-CM

## 2017-06-12 DIAGNOSIS — Z79899 Other long term (current) drug therapy: Secondary | ICD-10-CM | POA: Diagnosis not present

## 2017-06-12 DIAGNOSIS — Z8619 Personal history of other infectious and parasitic diseases: Secondary | ICD-10-CM

## 2017-06-12 DIAGNOSIS — M19042 Primary osteoarthritis, left hand: Secondary | ICD-10-CM

## 2017-06-12 DIAGNOSIS — M8589 Other specified disorders of bone density and structure, multiple sites: Secondary | ICD-10-CM

## 2017-06-12 DIAGNOSIS — H35389 Toxic maculopathy, unspecified eye: Secondary | ICD-10-CM

## 2017-06-12 MED ORDER — PREDNISONE 5 MG PO TABS: ORAL_TABLET | ORAL | 0 refills | Status: DC

## 2017-06-12 NOTE — Patient Instructions (Signed)
Standing Labs We placed an order today for your standing lab work.    Please come back and get your standing labs in March (CBC and CMP with GFR) and every 3 months  We have open lab Monday through Friday from 8:30-11:30 AM and 1:30-4 PM at the office of Dr. Pollyann Savoy.   The office is located at [address], [address], [place], [place] [address] No appointment is necessary.   Labs are drawn by [facility].  You may receive a bill from [facility] for your lab work. If you have any questions regarding directions or hours of operation,  please call [phone].

## 2017-07-10 ENCOUNTER — Telehealth: Payer: Self-pay | Admitting: Rheumatology

## 2017-07-10 MED ORDER — METHOTREXATE SODIUM CHEMO INJECTION 50 MG/2ML: INTRAMUSCULAR | 0 refills | Status: DC

## 2017-07-10 NOTE — Telephone Encounter (Signed)
Patient left a voicemail requesting a prescription refill of Methotrexate.  Patient has switched pharmacies and now uses [facility] in [place], [place].  Patient's CB# [phone]

## 2017-07-10 NOTE — Telephone Encounter (Signed)
Last Visit: 06/12/17 Next Visit: 11/19/17 Labs: 06/05/17 WNL  Okay to refill per Dr. Corliss Skains

## 2017-07-24 ENCOUNTER — Ambulatory Visit: Payer: MEDICARE | Admitting: Rheumatology

## 2017-08-18 ENCOUNTER — Telehealth: Payer: Self-pay | Admitting: Rheumatology

## 2017-08-18 NOTE — Telephone Encounter (Signed)
Patient requesting something for the pain, and swelling in hands/ wrists. Patient would like something called into the [facility] in [place]. [phone] Please let patient know when meds sent in, per patient.

## 2017-08-18 NOTE — Telephone Encounter (Signed)
Patient states she is having swelling in her hands and wrists for last 2 days. Patient is currently on MTX 0.8 mL weekly. Patient was last given a Prednisone taper 06/12/17. Patient requesting a prednisone taper. Please advise.

## 2017-08-19 MED ORDER — PREDNISONE 5 MG PO TABS: ORAL_TABLET | ORAL | 0 refills | Status: DC

## 2017-08-19 NOTE — Addendum Note (Signed)
Addended by: Henriette Combs on: 08/19/2017 09:02 AM   Modules accepted: Orders

## 2017-08-19 NOTE — Telephone Encounter (Signed)
Ok to give Pred taper as rxd in 10/18. If she continues tp have flare then she should sch appt. to modify Tt.

## 2017-08-19 NOTE — Telephone Encounter (Signed)
Patient advised prescription for Prednisone sent to the pharmacy.  

## 2017-08-28 ENCOUNTER — Other Ambulatory Visit: Payer: Self-pay | Admitting: Rheumatology

## 2017-08-28 NOTE — Telephone Encounter (Signed)
Last Visit: 06/12/17 Next Visit: 11/19/17 Labs: 06/05/17 WNL  Okay to refill per Dr. Deveshwar 

## 2017-11-11 ENCOUNTER — Other Ambulatory Visit: Payer: Self-pay

## 2017-11-11 ENCOUNTER — Telehealth: Payer: Self-pay | Admitting: Rheumatology

## 2017-11-11 DIAGNOSIS — Z79899 Other long term (current) drug therapy: Secondary | ICD-10-CM

## 2017-11-11 NOTE — Progress Notes (Signed)
Office Visit Note  Patient: April Shepard             Date of Birth: May 12, 1941           MRN: [id number]             PCP: Colbert Coyer, MD Referring: Colbert Coyer, MD Visit Date: 11/19/2017 Occupation: @GUAROCC @    Subjective:  Pain in multiple joints   History of Present Illness: April Shepard is a 77 y.o. female with history of seronegative rheumatoid arthritis and osteoarthritis.  Patient injects methotrexate 0.8 mL subcutaneously once weekly and takes folic acid 2 mg daily.  She reports that she developed increased joint pain in multiple joints at the end of May and took some of her husband's prednisone.  She states that started her own taper taking about 4 tablets for 2 days and tapering by 5 mg daily.  She started her taper on 11/11/2017.  She states that she continues to have discomfort in her bilateral shoulders bilateral wrists bilateral hands and bilateral feet.  She states she is having some swelling in her hands as well as her knees.  She states that the prednisone did help the swelling considerably.  She states she continues to have stiffness last about 30 minutes in the morning.  She states her last methotrexate dose was today.  She would like to discuss adding another medication.    Activities of Daily Living:  Patient reports morning stiffness for 30 minutes.   Patient Denies nocturnal pain.  Difficulty dressing/grooming: Denies Difficulty climbing stairs: Denies Difficulty getting out of chair: Denies Difficulty using hands for taps, buttons, cutlery, and/or writing: Denies   Review of Systems  Constitutional: Positive for fatigue.  HENT: Negative for mouth sores, mouth dryness and nose dryness.   Eyes: Negative for pain, visual disturbance and dryness.  Respiratory: Negative for cough, hemoptysis, shortness of breath and difficulty breathing.   Cardiovascular: Negative for chest pain, palpitations, hypertension and swelling in legs/feet.    Gastrointestinal: Negative for blood in stool, constipation and diarrhea.  Endocrine: Negative for increased urination.  Genitourinary: Negative for painful urination.  Musculoskeletal: Positive for arthralgias, joint pain, joint swelling and morning stiffness. Negative for myalgias, muscle weakness, muscle tenderness and myalgias.  Skin: Negative for color change, pallor, rash, hair loss, nodules/bumps, skin tightness, ulcers and sensitivity to sunlight.  Allergic/Immunologic: Negative for susceptible to infections.  Neurological: Negative for dizziness, numbness, headaches and weakness.  Hematological: Negative for swollen glands.  Psychiatric/Behavioral: Negative for depressed mood and sleep disturbance. The patient is not nervous/anxious.     PMFS History:  Patient Active Problem List   Diagnosis Date Noted  . 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 High risk medication use 10/22/2016  . Trigger finger, right ring finger 10/22/2016  . Osteopenia 05/13/2016  . Osteoarthritis of both hands 05/13/2016  . RA (rheumatoid arthritis) (HCC) 05/13/2016    Past Medical History:  Diagnosis Date  . Osteoarthritis of both hands 05/13/2016  . Osteopenia 05/13/2016  . RA (rheumatoid arthritis) (HCC) 05/13/2016    Family History  Problem Relation Age of Onset  . Breast cancer Mother   . Heart attack Father   . Melanoma Brother    Past Surgical History:  Procedure Laterality Date  . CHOLECYSTECTOMY     Social History   Social History Narrative  . Not on file     Objective: Vital Signs: BP (!) 146/89 (BP Location: Left Arm, Patient  Position: Sitting, Cuff Size: Small)   Pulse 74   Resp 12   Ht 5\' 3"  (1.6 m)   Wt 150 lb (68 kg)   BMI 26.57 kg/m    Physical Exam  Constitutional: She is oriented to person, place, and time. She appears well-developed and well-nourished.  HENT:  Head: Normocephalic and atraumatic.  Eyes: Conjunctivae and EOM are  normal.  Neck: Normal range of motion.  Cardiovascular: Normal rate, regular rhythm, normal heart sounds and intact distal pulses.  Pulmonary/Chest: Effort normal and breath sounds normal.  Abdominal: Soft. Bowel sounds are normal.  Lymphadenopathy:    She has no cervical adenopathy.  Neurological: She is alert and oriented to person, place, and time.  Skin: Skin is warm and dry. Capillary refill takes less than 2 seconds.  Psychiatric: She has a normal mood and affect. Her behavior is normal.  Nursing note and vitals reviewed.    Musculoskeletal Exam: C-spine, thoracic spine, lumbar spine good range of motion.  No midline spinal tenderness.  No SI joint tenderness.  Shoulder joints, elbow joints, wrist joints, MCPs, PIPs, DIPs good range of motion.  She has synovial thickening of all MCP joints.  She also synovial thickening of bilateral wrists.  She has synovitis of first and second MCP joints.  Hip joints, knee joints, ankle joints, MTPs, PIPs, DIPs good range of motion with no synovitis.  No warmth or effusion of bilateral knee joints.  No tenderness of trochanteric bursa bilaterally.  CDAI Exam: CDAI Homunculus Exam:   Joint Counts:  CDAI Tender Joint count: 0 CDAI Swollen Joint count: 0  Global Assessments:  Patient Global Assessment: 3 Provider Global Assessment: 3  CDAI Calculated Score: 6    Investigation: No additional findings. Labs: 06/05/2017   Imaging: Xr Foot 2 Views Left  Result Date: 11/19/2017 First MTP narrowing PIP and DIP narrowing was noted.  Possible erosive versus cystic changes were noted in the fifth MTP joint. Impression: These findings are consistent with rheumatoid arthritis and osteoarthritis overlap.  Xr Foot 2 Views Right  Result Date: 11/19/2017 Severe first MTP narrowing PIP and DIP narrowing was noted.  No intertarsal joint space narrowing was noted.  Just articular osteopenia was noted.  Tibiotalar joint space narrowing was noted.   Impression: These findings are consistent with rheumatoid arthritis and osteoarthritis overlap.  Xr Hand 2 View Left  Result Date: 11/19/2017 Juxta-articular osteopenia was noted.  PIP narrowing was noted.  First and second MCP joint narrowing was noted.  Severe intercarpal and radiocarpal joint space narrowing was noted.  No erosive changes were noted. Impression: These findings are consistent with severe rheumatoid arthritis and osteoarthritis overlap.  Xr Hand 2 View Right  Result Date: 11/19/2017 First second and third MCP joint narrowing was noted.  Possible erosive changes were noted in the third MCP joint.  PIP narrowing was noted.  Severe intercarpal radiocarpal joint space narrowing was noted.  Erosive changes were noted in the ulnar styloid.  CMC joint narrowing was noted. Impression: These findings are consistent with rheumatoid arthritis and osteoarthritis overlap.   Speciality Comments: No specialty comments available.    Procedures:  No procedures performed Allergies: Patient has no known allergies.   Assessment / Plan:     Visit Diagnoses: Rheumatoid arthritis of multiple sites with negative rheumatoid factor (HCC): She has synovitis of her first and second MCP joints bilaterally.  She has been having increased discomfort in multiple joints including bilateral shoulders bilateral wrists and bilateral hands and bilateral  feet.  She started her on prednisone taper on 11/11/2017 by taking her husband's leftover prednisone.  She reports the prednisone helped with the swelling.  She continues to inject methotrexate 0.8 mL subcutaneously once weekly and takes folic acid 2 mg daily.  We will add on sulfasalazine 500 mg 2 tablets twice daily.  Indications, contraindications, potential side effects were discussed.  Consent was obtained and all questions were addressed.  We will check G6PD today and once the lab results are back we will send in sulfasalazine to the pharmacy.  Prednisone taper  was sent to the pharmacy starting at 20 mg and tapering by 5 mg every 4 days.  She will follow-up in 3 months with [place].  High risk medication use - MTX 0.8 ml sq q wk, folic acid 2mg  po qd. (switched to SQ MTX 10/2016 due to frequent flares).  -She had lab work on 11/11/2017.  LFTs were within normal limits and renal function was then within normal limits.  We will check G6PD today.  Plan: Glucose 6 phosphate dehydrogenase  Primary osteoarthritis of both hands: PIP and DIP synovial thickening consistent with osteoarthritis of bilateral hands.  Joint protection and muscle strengthening were discussed.  Pain in both hands -She has been having increased pain and swelling in bilateral hands.  We obtained x-rays of bilateral hands today in the office.  Plan: XR Hand 2 View Right, XR Hand 2 View Left  Pain in both feet -She has been having increased discomfort in bilateral feet.  X-rays of her feet were obtained today.  She had no synovitis on exam.  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  Osteopenia of multiple sites  History of Clostridium difficile infection    Orders: Orders Placed This Encounter  Procedures  . XR Hand 2 View Right  . XR Hand 2 View Left  . XR Foot 2 Views Right  . XR Foot 2 Views Left  . Glucose 6 phosphate dehydrogenase   Meds ordered this encounter  Medications  . predniSONE (DELTASONE) 5 MG tablet    Sig: Take 4 tablets by mouth for 4 days, 3 tablets for 4 days, 2 tablets for 4 days, 1 tablet for 4 days.    Dispense:  40 tablet    Refill:  0    Face-to-face time spent with patient was 30 minutes. >50% of time was spent in counseling and coordination of care.  Follow-Up Instructions: Return in about 3 months (around 02/19/2018) for Rheumatoid arthritis, Osteoarthritis.   04/21/2018 PA-C  I examined and evaluated the patient with Sherron Ales PA.  Patient had no  synovitis on examination as she has been taking prednisone.  She has been having frequent flares.  She had to prednisone taper this year already.  We had detailed discussion regarding addition of Biologics and DMARDs.  At this point we will try sulfasalazine along with methotrexate.  We will see her back in 3 months.  If she still has ongoing symptoms we may switch her to a biologic agent.  The plan of care was discussed as noted above.  Sherron Ales, MD    Note - This record has been created using Pollyann Savoy.  Chart creation errors have been sought, but may not always  have been located. Such creation errors do not reflect on  the standard of medical care.

## 2017-11-11 NOTE — Telephone Encounter (Signed)
Pt # [id number] Patient going now for labs. Please fax over orders to [facility] in [place] Fax# [phone].

## 2017-11-11 NOTE — Telephone Encounter (Signed)
Lab orders have been released and faxed to number provided.  °

## 2017-11-18 ENCOUNTER — Telehealth: Payer: Self-pay | Admitting: Rheumatology

## 2017-11-18 NOTE — Telephone Encounter (Signed)
Patient had labs drawn in [place] on 5/28, but has not heard anything from our office in reference to these lab. Lab phone# [phone] Patient has a scheduled appt tomorrow 11/19/17, and wants doctors to be able to see those results. Please advise.

## 2017-11-18 NOTE — Telephone Encounter (Signed)
Patient advised lab results have not been received but have contacted lab and they are faxing results.

## 2017-11-19 ENCOUNTER — Encounter: Payer: Self-pay | Admitting: Rheumatology

## 2017-11-19 ENCOUNTER — Ambulatory Visit (INDEPENDENT_AMBULATORY_CARE_PROVIDER_SITE_OTHER): Payer: Self-pay

## 2017-11-19 ENCOUNTER — Ambulatory Visit (INDEPENDENT_AMBULATORY_CARE_PROVIDER_SITE_OTHER): Payer: MEDICARE | Admitting: Rheumatology

## 2017-11-19 VITALS — BP 146/89 | HR 74 | Resp 12 | Ht 63.0 in | Wt 150.0 lb

## 2017-11-19 DIAGNOSIS — M79671 Pain in right foot: Secondary | ICD-10-CM

## 2017-11-19 DIAGNOSIS — M79642 Pain in left hand: Secondary | ICD-10-CM

## 2017-11-19 DIAGNOSIS — Z79899 Other long term (current) drug therapy: Secondary | ICD-10-CM

## 2017-11-19 DIAGNOSIS — M8589 Other specified disorders of bone density and structure, multiple sites: Secondary | ICD-10-CM | POA: Diagnosis not present

## 2017-11-19 DIAGNOSIS — M19041 Primary osteoarthritis, right hand: Secondary | ICD-10-CM | POA: Diagnosis not present

## 2017-11-19 DIAGNOSIS — M79672 Pain in left foot: Secondary | ICD-10-CM

## 2017-11-19 DIAGNOSIS — H35389 Toxic maculopathy, unspecified eye: Secondary | ICD-10-CM | POA: Diagnosis not present

## 2017-11-19 DIAGNOSIS — M19042 Primary osteoarthritis, left hand: Secondary | ICD-10-CM

## 2017-11-19 DIAGNOSIS — M0609 Rheumatoid arthritis without rheumatoid factor, multiple sites: Secondary | ICD-10-CM | POA: Diagnosis not present

## 2017-11-19 DIAGNOSIS — M79641 Pain in right hand: Secondary | ICD-10-CM

## 2017-11-19 DIAGNOSIS — T372X5A Adverse effect of antimalarials and drugs acting on other blood protozoa, initial encounter: Secondary | ICD-10-CM

## 2017-11-19 DIAGNOSIS — Z8619 Personal history of other infectious and parasitic diseases: Secondary | ICD-10-CM

## 2017-11-19 MED ORDER — PREDNISONE 5 MG PO TABS: ORAL_TABLET | ORAL | 0 refills | Status: DC

## 2017-11-19 NOTE — Patient Instructions (Signed)
Sulfasalazine tablets What is this medicine? SULFASALAZINE (sul fa SAL a zeen) is used to treat ulcerative colitis. This medicine may be used for other purposes; ask your health care provider or pharmacist if you have questions. COMMON BRAND NAME(S): Azulfidine, Sulfazine What should I tell my health care provider before I take this medicine? They need to know if you have any of these conditions: -asthma -blood disorders or anemia -glucose-6-phosphate dehydrogenase (G6PD) deficiency -intestinal obstruction -kidney disease -liver disease -porphyria -urinary tract obstruction -an unusual reaction to sulfasalazine, sulfa drugs, salicylates, or other medicines, foods, dyes, or preservatives -pregnant or trying to get pregnant -breast-feeding How should I use this medicine? Take this medicine by mouth with a full glass of water. Follow the directions on the prescription label. If the medicine upsets your stomach, take it with food or milk. Take your medicine at regular intervals. Do not take your medicine more often than directed. Do not stop taking except on your doctor's advice. Talk to your pediatrician regarding the use of this medicine in children. While this drug may be prescribed for children as young as 6 years for selected conditions, precautions do apply. Patients over 77 years old may have a stronger reaction and need a smaller dose. Overdosage: If you think you have taken too much of this medicine contact a poison control center or emergency room at once. NOTE: This medicine is only for you. Do not share this medicine with others. What if I miss a dose? If you miss a dose, take it as soon as you can. If it is almost time for your next dose, take only that dose. Do not take double or extra doses. What may interact with this medicine? -digoxin -folic acid This list may not describe all possible interactions. Give your health care provider a list of all the medicines, herbs,  non-prescription drugs, or dietary supplements you use. Also tell them if you smoke, drink alcohol, or use illegal drugs. Some items may interact with your medicine. What should I watch for while using this medicine? Visit your doctor or health care professional for regular checks on your progress. You will need frequent blood and urine checks. This medicine can make you more sensitive to the sun. Keep out of the sun. If you cannot avoid being in the sun, wear protective clothing and use sunscreen. Do not use sun lamps or tanning beds/booths. Drink plenty of water while taking this medicine. What side effects may I notice from receiving this medicine? Side effects that you should report to your doctor or health care professional as soon as possible: -allergic reactions like skin rash, itching or hives, swelling of the face, lips, or tongue -fever, chills, or any other sign of infection -painful, difficult, or reduced urination -redness, blistering, peeling or loosening of the skin, including inside the mouth -severe stomach pain -unusual bleeding or bruising -unusually weak or tired -yellowing of the skin or eyes Side effects that usually do not require medical attention (report to your doctor or health care professional if they continue or are bothersome): -headache -loss of appetite -nausea, vomiting -orange color to the urine -reduced sperm count This list may not describe all possible side effects. Call your doctor for medical advice about side effects. You may report side effects to FDA at 1-800-FDA-1088. Where should I keep my medicine? Keep out of the reach of children. Store at room temperature between 15 and 30 degrees C (59 and 86 degrees F). Keep container tightly closed. Throw away  any unused medicine after the expiration date. NOTE: This sheet is a summary. It may not cover all possible information. If you have questions about this medicine, talk to your doctor, pharmacist, or  health care provider.  2018 Elsevier/Gold Standard (2008-02-03 11:38:15)

## 2017-11-20 LAB — GLUCOSE 6 PHOSPHATE DEHYDROGENASE: G-6PDH: 17.9 U/g Hgb (ref 7.0–20.5)

## 2017-11-20 NOTE — Progress Notes (Signed)
G6PD within normal limits.

## 2017-12-04 ENCOUNTER — Other Ambulatory Visit: Payer: Self-pay | Admitting: Rheumatology

## 2017-12-04 NOTE — Telephone Encounter (Signed)
Last Visit: 11/19/17 Next visit: 03/11/18 Labs: 11/11/17 WNL  Okay to refill per Dr. Corliss Skains

## 2017-12-16 ENCOUNTER — Telehealth: Payer: Self-pay | Admitting: Rheumatology

## 2017-12-16 NOTE — Telephone Encounter (Signed)
Results Delivered : Covered with no Restrictions - Buy & Bill Available CPT code 18563 is valid and billable with a 20% Co-Insurance. CPT code 14970 is valid and billable with a 20% Co-insurance. Prior Authorization is not required. The patient also has Medicare Supplement Plan F through [employer] which covers the remaining Medicare Part B deductible and coinsurance.

## 2017-12-16 NOTE — Telephone Encounter (Signed)
Patient left a voicemail stating she had an appointment with Dr. Corliss Skains on 11/19/17 and she discussed a new medication and patient is checking on the status.  Patient is requesting return call.

## 2017-12-17 NOTE — Telephone Encounter (Signed)
Patient advised that the medication is covered by her insurance. Patient advised we have to order the mediation and once we receive the medication we will make her a nurse visit. Patient verbalized understanding.

## 2018-01-07 ENCOUNTER — Ambulatory Visit (INDEPENDENT_AMBULATORY_CARE_PROVIDER_SITE_OTHER): Payer: MEDICARE | Admitting: *Deleted

## 2018-01-07 VITALS — BP 165/89 | HR 84

## 2018-01-07 DIAGNOSIS — M0609 Rheumatoid arthritis without rheumatoid factor, multiple sites: Secondary | ICD-10-CM | POA: Diagnosis not present

## 2018-01-07 MED ORDER — CERTOLIZUMAB PEGOL 2 X 200 MG/ML ~~LOC~~ KIT: 400.0000 mg | PACK | Freq: Once | SUBCUTANEOUS | Status: DC

## 2018-01-07 NOTE — Progress Notes (Addendum)
Patient in office for a new start to the [place]. Patient denies infection, being on antibiotics or fever. This injection was provided in the office. Patient was given in the right and left thigh. Patient tolerated injection well. Patient was monitored in office for 30 minutes after administration for adverse reactions. No adverse reactions noted.   Administrations This Visit    Certolizumab Pegol KIT 400 mg    Admin Date 01/07/2018 Action Given Dose 400 mg Route Subcutaneous Administered By Carole Binning, LPN

## 2018-01-07 NOTE — Patient Instructions (Signed)
Standing Labs We placed an order today for your standing lab work.    Please come back and get your standing labs in 1 month and every 3 months  We have open lab Monday through Friday from 8:30-11:30 AM and 1:30-4:00 PM  at the office of Dr. Shaili Deveshwar.   You may experience shorter wait times on Monday and Friday afternoons. The office is located at [address], [address], [place], [place] [address] No appointment is necessary.   Labs are drawn by [facility].  You may receive a bill from [facility] for your lab work. If you have any questions regarding directions or hours of operation,  please call [phone].     

## 2018-01-22 ENCOUNTER — Ambulatory Visit (INDEPENDENT_AMBULATORY_CARE_PROVIDER_SITE_OTHER): Payer: MEDICARE | Admitting: *Deleted

## 2018-01-22 VITALS — BP 148/88 | HR 68

## 2018-01-22 DIAGNOSIS — M0609 Rheumatoid arthritis without rheumatoid factor, multiple sites: Secondary | ICD-10-CM

## 2018-01-22 MED ORDER — CERTOLIZUMAB PEGOL 2 X 200 MG ~~LOC~~ KIT
400.0000 mg | PACK | Freq: Once | SUBCUTANEOUS | Status: AC
  Administered 2018-01-22: 400 mg via SUBCUTANEOUS

## 2018-01-22 NOTE — Progress Notes (Signed)
Patient in office for a Cimzia injection. Patient denies  Infection and being on antibiotics. Patient was given injections in her right and left thighs. Patient tolerated injections well. Patient will return in 2 weeks for next injections.  Administrations This Visit    Certolizumab Pegol KIT 400 mg    Admin Date 01/22/2018 Action Given Dose 400 mg Route Subcutaneous Administered By Carole Binning, LPN

## 2018-02-04 MED ORDER — CERTOLIZUMAB PEGOL 2 X 200 MG ~~LOC~~ KIT
400.0000 mg | PACK | Freq: Once | SUBCUTANEOUS | Status: AC
  Administered 2018-01-07: 400 mg via SUBCUTANEOUS

## 2018-02-04 NOTE — Addendum Note (Signed)
Addended by: Henriette Combs on: 02/04/2018 02:38 PM   Modules accepted: Orders

## 2018-02-05 ENCOUNTER — Ambulatory Visit (INDEPENDENT_AMBULATORY_CARE_PROVIDER_SITE_OTHER): Payer: MEDICARE | Admitting: *Deleted

## 2018-02-05 VITALS — BP 151/81 | HR 74

## 2018-02-05 DIAGNOSIS — M0609 Rheumatoid arthritis without rheumatoid factor, multiple sites: Secondary | ICD-10-CM | POA: Diagnosis not present

## 2018-02-05 DIAGNOSIS — Z79899 Other long term (current) drug therapy: Secondary | ICD-10-CM

## 2018-02-05 MED ORDER — CERTOLIZUMAB PEGOL 2 X 200 MG ~~LOC~~ KIT
400.0000 mg | PACK | Freq: Once | SUBCUTANEOUS | Status: AC
  Administered 2018-02-05: 400 mg via SUBCUTANEOUS

## 2018-02-05 NOTE — Progress Notes (Signed)
Patient in office for a Cimzia injection. Patient denies  Infection and being on antibiotics. Patient was given injections in her right and left thighs. Patient tolerated injections well. Patient will return in 1 month for next injection. Patient had lab performed today.   Administrations This Visit    Certolizumab Pegol KIT 400 mg    Admin Date 02/05/2018 Action Given Dose 400 mg Route Subcutaneous Administered By Carole Binning, LPN

## 2018-02-06 LAB — CBC WITH DIFFERENTIAL/PLATELET
Basophils Absolute: 91 cells/uL (ref 0–200)
Basophils Relative: 1.6 %
Eosinophils Absolute: 211 cells/uL (ref 15–500)
Eosinophils Relative: 3.7 %
HCT: 38.4 % (ref 35.0–45.0)
Hemoglobin: 13.1 g/dL (ref 11.7–15.5)
Lymphs Abs: 1824 cells/uL (ref 850–3900)
MCH: 31.3 pg (ref 27.0–33.0)
MCHC: 34.1 g/dL (ref 32.0–36.0)
MCV: 91.6 fL (ref 80.0–100.0)
MPV: 8.9 fL (ref 7.5–12.5)
Monocytes Relative: 9.1 %
Neutro Abs: 3055 cells/uL (ref 1500–7800)
Neutrophils Relative %: 53.6 %
Platelets: 297 10*3/uL (ref 140–400)
RBC: 4.19 10*6/uL (ref 3.80–5.10)
RDW: 14.2 % (ref 11.0–15.0)
Total Lymphocyte: 32 %
WBC mixed population: 519 cells/uL (ref 200–950)
WBC: 5.7 10*3/uL (ref 3.8–10.8)

## 2018-02-06 LAB — COMPLETE METABOLIC PANEL WITH GFR
AG Ratio: 2 (calc) (ref 1.0–2.5)
ALT: 25 U/L (ref 6–29)
AST: 29 U/L (ref 10–35)
Albumin: 4.3 g/dL (ref 3.6–5.1)
Alkaline phosphatase (APISO): 58 U/L (ref 33–130)
BUN: 16 mg/dL (ref 7–25)
CO2: 28 mmol/L (ref 20–32)
Calcium: 10.2 mg/dL (ref 8.6–10.4)
Chloride: 101 mmol/L (ref 98–110)
Creat: 0.86 mg/dL (ref 0.60–0.93)
GFR, Est African American: 76 mL/min/{1.73_m2} (ref >=60)
GFR, Est Non African American: 65 mL/min/{1.73_m2} (ref >=60)
Globulin: 2.1 g/dL (calc) (ref 1.9–3.7)
Glucose, Bld: 88 mg/dL (ref 65–99)
Potassium: 4.6 mmol/L (ref 3.5–5.3)
Sodium: 139 mmol/L (ref 135–146)
Total Bilirubin: 0.4 mg/dL (ref 0.2–1.2)
Total Protein: 6.4 g/dL (ref 6.1–8.1)

## 2018-02-16 ENCOUNTER — Other Ambulatory Visit: Payer: Self-pay | Admitting: Rheumatology

## 2018-02-17 NOTE — Telephone Encounter (Signed)
Last Visit: 11/19/17 Next visit: 03/11/18 Labs: 02/05/18 WNL  Okay to refill per Dr. Corliss Skains

## 2018-02-19 NOTE — Progress Notes (Signed)
Office Visit Note  Patient: April Shepard             Date of Birth: 1941/05/25           MRN: [id number]             PCP: Colbert Coyer, MD Referring: Colbert Coyer, MD Visit Date: 03/05/2018 Occupation: @GUAROCC @  Subjective:  Medication monitoring   History of Present Illness: April Shepard is a 77 y.o. female with history of seronegative rheumatoid arthritis and osteoarthritis.  She has noticed significant improvement since starting on Cimzia.  She continues to inject methotrexate 0.8 mL subcutaneously once weekly and takes folic acid 2 mg daily.  She states that she is having some right ankle swelling intermittently but denies any pain at this time.  She denies any pain in her hands or wrists at this time.  She is been very pleased with the results she has noticed after starting on Cimzia.   Activities of Daily Living:  Patient reports morning stiffness for 30 minutes.   Patient Denies nocturnal pain.  Difficulty dressing/grooming: Denies Difficulty climbing stairs: Denies Difficulty getting out of chair: Denies Difficulty using hands for taps, buttons, cutlery, and/or writing: Denies  Review of Systems  Constitutional: Negative for fatigue.  HENT: Negative for mouth sores, mouth dryness and nose dryness.   Eyes: Negative for pain, visual disturbance and dryness.  Respiratory: Negative for cough, hemoptysis, shortness of breath and difficulty breathing.   Cardiovascular: Positive for swelling in legs/feet. Negative for chest pain, palpitations and hypertension.  Gastrointestinal: Negative for blood in stool, constipation and diarrhea.  Endocrine: Negative for increased urination.  Genitourinary: Negative for difficulty urinating and painful urination.  Musculoskeletal: Positive for arthralgias, joint pain, joint swelling and morning stiffness. Negative for myalgias, muscle weakness, muscle tenderness and myalgias.  Skin: Negative for color change, pallor,  rash, hair loss, nodules/bumps, skin tightness, ulcers and sensitivity to sunlight.  Allergic/Immunologic: Negative for susceptible to infections.  Neurological: Negative for dizziness, numbness, headaches and weakness.  Hematological: Negative for swollen glands.  Psychiatric/Behavioral: Negative for depressed mood and sleep disturbance. The patient is not nervous/anxious.     PMFS History:  Patient Active Problem List   Diagnosis Date Noted  . 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 High risk medication use 10/22/2016  . Trigger finger, right ring finger 10/22/2016  . Osteopenia 05/13/2016  . Osteoarthritis of both hands 05/13/2016  . RA (rheumatoid arthritis) (HCC) 05/13/2016    Past Medical History:  Diagnosis Date  . Osteoarthritis of both hands 05/13/2016  . Osteopenia 05/13/2016  . RA (rheumatoid arthritis) (HCC) 05/13/2016    Family History  Problem Relation Age of Onset  . Breast cancer Mother   . Heart attack Father   . Melanoma Brother    Past Surgical History:  Procedure Laterality Date  . CHOLECYSTECTOMY     Social History   Social History Narrative  . Not on file    Objective: Vital Signs: BP (!) 153/91 (BP Location: Left Arm, Patient Position: Sitting, Cuff Size: Normal)   Pulse 76   Ht 5\' 3"  (1.6 m)   Wt 151 lb (68.5 kg)   BMI 26.75 kg/m    Physical Exam  Constitutional: She is oriented to person, place, and time. She appears well-developed and well-nourished.  HENT:  Head: Normocephalic and atraumatic.  Eyes: Conjunctivae and EOM are normal.  Neck: Normal range of motion.  Cardiovascular: Normal rate, regular rhythm, normal  heart sounds and intact distal pulses.  Pulmonary/Chest: Effort normal and breath sounds normal.  Abdominal: Soft. Bowel sounds are normal.  Lymphadenopathy:    She has no cervical adenopathy.  Neurological: She is alert and oriented to person, place, and time.  Skin: Skin is warm and dry.  Capillary refill takes less than 2 seconds.  Psychiatric: She has a normal mood and affect. Her behavior is normal.  Nursing note and vitals reviewed.    Musculoskeletal Exam: C-spine, thoracic spine, lumbar spine good range of motion.  No midline spinal tenderness.  No SI joint tenderness.  Shoulder joints, elbow joints, wrist joints, MCPs, PIPs, DIPs good range of motion.  She has synovial thickening of all MCP joints.  She has synovial thickening of bilateral wrist joints.  Hip joints, knee joints good range of motion with no discomfort.  No warmth or effusion noted.  She has bilateral knee crepitus.  She has swelling of the right ankle joint on exam.  Swelling on the dorsal aspect of the right foot.  No tenderness of trochanter bursa bilaterally.  CDAI Exam: CDAI Score: 0.2  Patient Global Assessment: 1 (mm); Provider Global Assessment: 1 (mm) Swollen: 1 ; Tender: 0  Joint Exam      Right  Left  Ankle  Swollen         Investigation: No additional findings.  Imaging: No results found.  Recent Labs: Lab Results  Component Value Date   WBC 5.7 02/05/2018   HGB 13.1 02/05/2018   PLT 297 02/05/2018   NA 139 02/05/2018   K 4.6 02/05/2018   CL 101 02/05/2018   CO2 28 02/05/2018   GLUCOSE 88 02/05/2018   BUN 16 02/05/2018   CREATININE 0.86 02/05/2018   BILITOT 0.4 02/05/2018   ALKPHOS 59 05/14/2016   AST 29 02/05/2018   ALT 25 02/05/2018   PROT 6.4 02/05/2018   ALBUMIN 4.5 05/14/2016   CALCIUM 10.2 02/05/2018   GFRAA 76 02/05/2018    Speciality Comments: no baseline biologic labs  Procedures:  No procedures performed Allergies: Patient has no known allergies.   Assessment / Plan:     Visit Diagnoses: Rheumatoid arthritis of multiple sites with negative rheumatoid factor (HCC): She has no synovitis on exam today.  She is synovial thickening of all MCP joints as well as bilateral wrist joints.  She has been having intermittent swelling in her right ankle.  She is no  tenderness of the right ankle on exam today.  She is clinically doing well on Cimzia subcutaneous injections once monthly and methotrexate 0.3 mL subcutaneous a once weekly.  She has noted significant benefit since starting on Cimzia.  She had the subcutaneous injection performed in the office today.  High risk medication use -  Cimzia, MTX 0.8 ml sq q wk, folic acid 2mg  po qd. (switched to SQ MTX 10/2016 due to frequent flares).  Immunosuppressive labs will be checked today.  She has CBC and CMP that were within normal limits on 02/05/2018.  She will return for CBC and CMP in November to monitor for drug toxicity.- Plan: Hepatitis B core antibody, IgM, Hepatitis B surface antigen, Hepatitis C antibody, QuantiFERON-TB Gold Plus, HIV Antibody (routine testing w rflx), Serum protein electrophoresis with reflex, IgG, IgA, IgM  Primary osteoarthritis of both hands: She has PIP and DIP synovial thickening consistent with osteoarthritis of bilateral hands.  She has bilateral CMC joint synovial thickening.  She is complete fist formation bilaterally.  Joint protection and muscle strengthening  were discussed.  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  She continues to have limited range of motion and locking.  Orders: Orders Placed This Encounter  Procedures  . Hepatitis B core antibody, IgM  . Hepatitis B surface antigen  . Hepatitis C antibody  . QuantiFERON-TB Gold Plus  . HIV Antibody (routine testing w rflx)  . Serum protein electrophoresis with reflex  . IgG, IgA, IgM   No orders of the defined types were placed in this encounter.   Face-to-face time spent with patient was 30 minutes. Greater than 50% of time was spent in counseling and coordination of care.  Follow-Up Instructions: Return in about 5 months (around 08/05/2018) for  Rheumatoid arthritis, Osteoarthritis.   Gearldine Bienenstock, PA-C  Note - This record has been created using Dragon software.  Chart creation errors have been sought, but may not always  have been located. Such creation errors do not reflect on  the standard of medical care.

## 2018-03-05 ENCOUNTER — Encounter: Payer: Self-pay | Admitting: Physician Assistant

## 2018-03-05 ENCOUNTER — Other Ambulatory Visit: Payer: Self-pay | Admitting: *Deleted

## 2018-03-05 ENCOUNTER — Ambulatory Visit (INDEPENDENT_AMBULATORY_CARE_PROVIDER_SITE_OTHER): Payer: MEDICARE | Admitting: Physician Assistant

## 2018-03-05 VITALS — BP 153/91 | HR 76 | Ht 63.0 in | Wt 151.0 lb

## 2018-03-05 DIAGNOSIS — Z79899 Other long term (current) drug therapy: Secondary | ICD-10-CM

## 2018-03-05 DIAGNOSIS — M19041 Primary osteoarthritis, right hand: Secondary | ICD-10-CM

## 2018-03-05 DIAGNOSIS — M8589 Other specified disorders of bone density and structure, multiple sites: Secondary | ICD-10-CM

## 2018-03-05 DIAGNOSIS — M0609 Rheumatoid arthritis without rheumatoid factor, multiple sites: Secondary | ICD-10-CM | POA: Diagnosis not present

## 2018-03-05 DIAGNOSIS — M65341 Trigger finger, right ring finger: Secondary | ICD-10-CM

## 2018-03-05 DIAGNOSIS — H35389 Toxic maculopathy, unspecified eye: Secondary | ICD-10-CM | POA: Diagnosis not present

## 2018-03-05 DIAGNOSIS — M19042 Primary osteoarthritis, left hand: Secondary | ICD-10-CM

## 2018-03-05 DIAGNOSIS — T372X5A Adverse effect of antimalarials and drugs acting on other blood protozoa, initial encounter: Secondary | ICD-10-CM

## 2018-03-05 DIAGNOSIS — Z8619 Personal history of other infectious and parasitic diseases: Secondary | ICD-10-CM

## 2018-03-05 MED ORDER — CERTOLIZUMAB PEGOL 2 X 200 MG ~~LOC~~ KIT
400.0000 mg | PACK | Freq: Once | SUBCUTANEOUS | Status: AC
  Administered 2018-03-05: 400 mg via SUBCUTANEOUS

## 2018-03-05 NOTE — Patient Instructions (Signed)
Standing Labs We placed an order today for your standing lab work.    Please come back and get your standing labs in November and every 3 months   We have open lab Monday through Friday from 8:30-11:30 AM and 1:30-4:00 PM  at the office of Dr. Shaili Deveshwar.   You may experience shorter wait times on Monday and Friday afternoons. The office is located at [address], [address], [place], [place] [address] No appointment is necessary.   Labs are drawn by [facility].  You may receive a bill from [facility] for your lab work. If you have any questions regarding directions or hours of operation,  please call [phone].     

## 2018-03-05 NOTE — Progress Notes (Signed)
Patient received Cimzia injection in right and left thighs. Patient denies fever, infection or use of antibiotics. Patient tolerated injection well. Patient due for next injection in 1 month. Administrations This Visit    Certolizumab Pegol KIT 400 mg    Admin Date 03/05/2018 Action Given Dose 400 mg Route Subcutaneous Administered By Carole Binning, LPN

## 2018-03-11 ENCOUNTER — Ambulatory Visit: Payer: MEDICARE | Admitting: Rheumatology

## 2018-03-11 LAB — HIV ANTIBODY (ROUTINE TESTING W REFLEX): HIV 1&2 Ab, 4th Generation: NONREACTIVE

## 2018-03-11 LAB — IGG, IGA, IGM
IgG (Immunoglobin G), Serum: 844 mg/dL (ref 600–1540)
IgM, Serum: 156 mg/dL (ref 50–300)
Immunoglobulin A: 119 mg/dL (ref 20–320)

## 2018-03-11 LAB — QUANTIFERON-TB GOLD PLUS
Mitogen-NIL: >10 IU/mL
NIL: 0.14 IU/mL
QuantiFERON-TB Gold Plus: NEGATIVE
TB1-NIL: <0 IU/mL
TB2-NIL: 0.01 IU/mL

## 2018-03-11 LAB — PROTEIN ELECTROPHORESIS, SERUM, WITH REFLEX
Albumin ELP: 4.8 g/dL (ref 3.8–4.8)
Alpha 1: 0.2 g/dL (ref 0.2–0.3)
Alpha 2: 0.7 g/dL (ref 0.5–0.9)
Beta 2: 0.3 g/dL (ref 0.2–0.5)
Beta Globulin: 0.5 g/dL (ref 0.4–0.6)
Gamma Globulin: 0.7 g/dL — ABNORMAL LOW (ref 0.8–1.7)
Total Protein: 7.3 g/dL (ref 6.1–8.1)

## 2018-03-11 LAB — HEPATITIS C ANTIBODY
Hepatitis C Ab: NONREACTIVE
SIGNAL TO CUT-OFF: 0.03 (ref <1.00)

## 2018-03-11 LAB — HEPATITIS B SURFACE ANTIGEN: Hepatitis B Surface Ag: NONREACTIVE

## 2018-03-11 LAB — HEPATITIS B CORE ANTIBODY, IGM: Hep B C IgM: NONREACTIVE

## 2018-03-11 NOTE — Progress Notes (Signed)
Labs are WNL. Reviewed SPEP results with Dr. Corliss Skains and she is not concerned at this time.  She can continue on Cimzia sq injections.

## 2018-04-02 ENCOUNTER — Ambulatory Visit (INDEPENDENT_AMBULATORY_CARE_PROVIDER_SITE_OTHER): Payer: MEDICARE | Admitting: *Deleted

## 2018-04-02 VITALS — BP 131/75 | HR 77

## 2018-04-02 DIAGNOSIS — M0609 Rheumatoid arthritis without rheumatoid factor, multiple sites: Secondary | ICD-10-CM

## 2018-04-02 MED ORDER — CERTOLIZUMAB PEGOL 2 X 200 MG ~~LOC~~ KIT
400.0000 mg | PACK | Freq: Once | SUBCUTANEOUS | Status: AC
  Administered 2018-04-02: 400 mg via SUBCUTANEOUS

## 2018-04-02 NOTE — Patient Instructions (Signed)
Standing Labs We placed an order today for your standing lab work.    Please come back and get your standing labs in November and every 3 months  We have open lab Monday through Friday from 8:30-11:30 AM and 1:30-4:00 PM  at the office of Dr. Shaili Deveshwar.   You may experience shorter wait times on Monday and Friday afternoons. The office is located at [address], [address], [place], [place] [address] No appointment is necessary.   Labs are drawn by [facility].  You may receive a bill from [facility] for your lab work. If you have any questions regarding directions or hours of operation,  please call [phone].   Just as a reminder please drink plenty of water prior to coming for your lab work. Thanks!  

## 2018-04-02 NOTE — Progress Notes (Signed)
Patient in office for a Cimzia injection. Patient denies any infection, fever or antibiotic use. Patient given injection in right and left thigh. Patient tolerated injections well.  Administrations This Visit    Certolizumab Pegol KIT 400 mg    Admin Date 04/02/2018 Action Given Dose 400 mg Route Subcutaneous Administered By Carole Binning, LPN

## 2018-04-29 ENCOUNTER — Telehealth: Payer: Self-pay | Admitting: Rheumatology

## 2018-04-29 ENCOUNTER — Other Ambulatory Visit: Payer: Self-pay | Admitting: Rheumatology

## 2018-04-29 NOTE — Telephone Encounter (Signed)
Patient left a voicemail checking on the status of her prescription of Methotrexate to be sent to [facility] in [place], [place].

## 2018-04-30 ENCOUNTER — Ambulatory Visit (INDEPENDENT_AMBULATORY_CARE_PROVIDER_SITE_OTHER): Payer: MEDICARE | Admitting: *Deleted

## 2018-04-30 ENCOUNTER — Other Ambulatory Visit: Payer: Self-pay | Admitting: *Deleted

## 2018-04-30 VITALS — BP 157/78 | HR 76

## 2018-04-30 DIAGNOSIS — M0609 Rheumatoid arthritis without rheumatoid factor, multiple sites: Secondary | ICD-10-CM

## 2018-04-30 DIAGNOSIS — Z79899 Other long term (current) drug therapy: Secondary | ICD-10-CM

## 2018-04-30 LAB — CBC WITH DIFFERENTIAL/PLATELET
Basophils Absolute: 81 cells/uL (ref 0–200)
Basophils Relative: 1.3 %
Eosinophils Absolute: 130 cells/uL (ref 15–500)
Eosinophils Relative: 2.1 %
HCT: 40.7 % (ref 35.0–45.0)
Hemoglobin: 13.9 g/dL (ref 11.7–15.5)
Lymphs Abs: 1854 cells/uL (ref 850–3900)
MCH: 31.4 pg (ref 27.0–33.0)
MCHC: 34.2 g/dL (ref 32.0–36.0)
MCV: 92.1 fL (ref 80.0–100.0)
MPV: 8.9 fL (ref 7.5–12.5)
Monocytes Relative: 9.1 %
Neutro Abs: 3571 cells/uL (ref 1500–7800)
Neutrophils Relative %: 57.6 %
Platelets: 353 10*3/uL (ref 140–400)
RBC: 4.42 10*6/uL (ref 3.80–5.10)
RDW: 13.3 % (ref 11.0–15.0)
Total Lymphocyte: 29.9 %
WBC mixed population: 564 cells/uL (ref 200–950)
WBC: 6.2 10*3/uL (ref 3.8–10.8)

## 2018-04-30 LAB — COMPLETE METABOLIC PANEL WITH GFR
AG Ratio: 1.8 (calc) (ref 1.0–2.5)
ALT: 21 U/L (ref 6–29)
AST: 26 U/L (ref 10–35)
Albumin: 4.5 g/dL (ref 3.6–5.1)
Alkaline phosphatase (APISO): 71 U/L (ref 33–130)
BUN: 12 mg/dL (ref 7–25)
CO2: 31 mmol/L (ref 20–32)
Calcium: 10.9 mg/dL — ABNORMAL HIGH (ref 8.6–10.4)
Chloride: 99 mmol/L (ref 98–110)
Creat: 0.79 mg/dL (ref 0.60–0.93)
GFR, Est African American: 84 mL/min/{1.73_m2} (ref >=60)
GFR, Est Non African American: 72 mL/min/{1.73_m2} (ref >=60)
Globulin: 2.5 g/dL (calc) (ref 1.9–3.7)
Glucose, Bld: 83 mg/dL (ref 65–99)
Potassium: 4.6 mmol/L (ref 3.5–5.3)
Sodium: 138 mmol/L (ref 135–146)
Total Bilirubin: 0.5 mg/dL (ref 0.2–1.2)
Total Protein: 7 g/dL (ref 6.1–8.1)

## 2018-04-30 MED ORDER — CERTOLIZUMAB PEGOL 2 X 200 MG ~~LOC~~ KIT
400.0000 mg | PACK | Freq: Once | SUBCUTANEOUS | Status: AC
  Administered 2018-04-30: 400 mg via SUBCUTANEOUS

## 2018-04-30 MED ORDER — METHOTREXATE SODIUM CHEMO INJECTION 50 MG/2ML: 20.0000 mg | INTRAMUSCULAR | 0 refills | Status: DC

## 2018-04-30 NOTE — Progress Notes (Signed)
Patient in the office for a Cimzia injection . Patient denies fever, injection or being on antibiotics. Patient was given injection in right and left thighs. Patient tolerated injections well. Patient will return in 4 weeks for next injection. Patient had her labs drawn today.   Administrations This Visit    Certolizumab Pegol KIT 400 mg    Admin Date 04/30/2018 Action Given Dose 400 mg Route Subcutaneous Administered By Carole Binning, LPN

## 2018-04-30 NOTE — Patient Instructions (Signed)
Standing Labs We placed an order today for your standing lab work.    Please come back and get your standing labs in February and every 3 months  We have open lab Monday through Friday from 8:30-11:30 AM and 1:30-4:00 PM  at the office of Dr. Shaili Deveshwar.   You may experience shorter wait times on Monday and Friday afternoons. The office is located at [address], [address], [place], [place] [address] No appointment is necessary.   Labs are drawn by [facility].  You may receive a bill from [facility] for your lab work. If you have any questions regarding directions or hours of operation,  please call [phone].   Just as a reminder please drink plenty of water prior to coming for your lab work. Thanks!  

## 2018-04-30 NOTE — Telephone Encounter (Signed)
Last Visit: 03/04/18 Next Visit: 08/06/18 Labs: 02/05/18 WNL  Patient updating labs today.   Okay to refill per Dr. Corliss Skains

## 2018-05-01 ENCOUNTER — Telehealth: Payer: Self-pay | Admitting: Rheumatology

## 2018-05-01 NOTE — Telephone Encounter (Signed)
Advised patient of lab results and recommendations. Patient verbalized understanding.  

## 2018-05-01 NOTE — Telephone Encounter (Signed)
Patient called stating she was returning Marissa's call regarding her labwork results.   

## 2018-05-28 ENCOUNTER — Ambulatory Visit (INDEPENDENT_AMBULATORY_CARE_PROVIDER_SITE_OTHER): Payer: MEDICARE | Admitting: *Deleted

## 2018-05-28 DIAGNOSIS — M0609 Rheumatoid arthritis without rheumatoid factor, multiple sites: Secondary | ICD-10-CM | POA: Diagnosis not present

## 2018-05-28 MED ORDER — CERTOLIZUMAB PEGOL 2 X 200 MG ~~LOC~~ KIT
400.0000 mg | PACK | Freq: Once | SUBCUTANEOUS | Status: AC
  Administered 2018-05-28: 400 mg via SUBCUTANEOUS

## 2018-05-28 NOTE — Progress Notes (Signed)
Patient in office for a Cimzia injection. Patient denies any infection, fever or antibiotic use. Patient tolerated injection well. Patient will return in 1 month for next Cimzia injection.   Administrations This Visit    Certolizumab Pegol KIT 400 mg    Admin Date 05/28/2018 Action Given Dose 400 mg Route Subcutaneous Administered By Carole Binning, LPN

## 2018-05-31 ENCOUNTER — Other Ambulatory Visit: Payer: Self-pay | Admitting: Rheumatology

## 2018-06-01 NOTE — Telephone Encounter (Signed)
Last Visit: 03/05/18 Next Visit: 08/06/18  Okay to refill per Dr. Corliss Skains

## 2018-06-25 ENCOUNTER — Ambulatory Visit (INDEPENDENT_AMBULATORY_CARE_PROVIDER_SITE_OTHER): Payer: MEDICARE | Admitting: *Deleted

## 2018-06-25 VITALS — BP 137/75 | HR 73

## 2018-06-25 DIAGNOSIS — M0609 Rheumatoid arthritis without rheumatoid factor, multiple sites: Secondary | ICD-10-CM | POA: Diagnosis not present

## 2018-06-25 MED ORDER — CERTOLIZUMAB PEGOL 2 X 200 MG ~~LOC~~ KIT
400.0000 mg | PACK | Freq: Once | SUBCUTANEOUS | Status: AC
  Administered 2018-06-25: 400 mg via SUBCUTANEOUS

## 2018-06-25 NOTE — Progress Notes (Signed)
Patient in office for a Cimzia injection. Patient denies fever, infection or use of antibiotics. Patient given injections in right and left thighs. Patient will return in 1 month for next injection and labs.   Administrations This Visit    Certolizumab Pegol KIT 400 mg    Admin Date 06/25/2018 Action Given Dose 400 mg Route Subcutaneous Administered By Carole Binning, LPN

## 2018-07-04 ENCOUNTER — Other Ambulatory Visit: Payer: Self-pay | Admitting: Rheumatology

## 2018-07-06 NOTE — Telephone Encounter (Signed)
Last Visit: 03/05/18 Next Visit: 07/23/18 Labs: 04/30/18 Calcium is elevated. All other labs are WNL  Okay to refill per Dr. Corliss Skains

## 2018-07-09 NOTE — Progress Notes (Signed)
Office Visit Note  Patient: April Shepard             Date of Birth: 04/08/1941           MRN: [id number]             PCP: Artis Delay, MD Referring: Artis Delay, MD Visit Date: 07/23/2018 Occupation: '@GUAROCC' @  Subjective:  Medication management.  History of Present Illness: April Shepard is a 78 y.o. female with history of seronegative rheumatoid arthritis and osteoarthritis.  She is on Cimzia sq monthly injections, MTX 0.8 ml sq once weekly, and folic acid 2 mg po daily.  According to patient she is doing much better on Cimzia and methotrexate combination.  She states she has noticed some swelling in her left ring finger.  Activities of Daily Living:  Patient reports morning stiffness for0  minute.   Patient Denies nocturnal pain.  Difficulty dressing/grooming: Denies Difficulty climbing stairs: Denies Difficulty getting out of chair: Denies Difficulty using hands for taps, buttons, cutlery, and/or writing: Denies  Review of Systems  Constitutional: Negative for fatigue, night sweats, weight gain and weight loss.  HENT: Negative for mouth sores, trouble swallowing, trouble swallowing, mouth dryness and nose dryness.   Eyes: Negative for pain, redness, visual disturbance and dryness.  Respiratory: Negative for cough, shortness of breath and difficulty breathing.   Cardiovascular: Negative for chest pain, palpitations, hypertension, irregular heartbeat and swelling in legs/feet.  Gastrointestinal: Negative for blood in stool, constipation and diarrhea.  Endocrine: Negative for increased urination.  Genitourinary: Negative for vaginal dryness.  Musculoskeletal: Positive for arthralgias and joint pain. Negative for joint swelling, myalgias, muscle weakness, morning stiffness, muscle tenderness and myalgias.  Skin: Negative for color change, rash, hair loss, skin tightness, ulcers and sensitivity to sunlight.  Allergic/Immunologic: Negative for susceptible to  infections.  Neurological: Negative for dizziness, memory loss, night sweats and weakness.  Hematological: Negative for swollen glands.  Psychiatric/Behavioral: Negative for depressed mood and sleep disturbance. The patient is not nervous/anxious.     PMFS History:  Patient Active Problem List   Diagnosis Date Noted  . Dyslipidemia 07/23/2018  . 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 High risk medication use 10/22/2016  . Trigger finger, right ring finger 10/22/2016  . Osteopenia 05/13/2016  . Osteoarthritis of both hands 05/13/2016  . RA (rheumatoid arthritis) ([place]) 05/13/2016    Past Medical History:  Diagnosis Date  . Osteoarthritis of both hands 05/13/2016  . Osteopenia 05/13/2016  . RA (rheumatoid arthritis) ([place]) 05/13/2016    Family History  Problem Relation Age of Onset  . Breast cancer Mother   . Heart attack Father   . Melanoma Brother    Past Surgical History:  Procedure Laterality Date  . CHOLECYSTECTOMY     Social History   Social History Narrative  . Not on file    There is no immunization history on file for this patient.   Objective: Vital Signs: BP 139/83 (BP Location: Left Arm, Patient Position: Sitting, Cuff Size: Normal)   Pulse 79   Resp 12   Ht '5\' 3"'  (1.6 m)   Wt 152 lb 6.4 oz (69.1 kg)   BMI 27.00 kg/m    Physical Exam Vitals signs and nursing note reviewed.  Constitutional:      Appearance: She is well-developed.  HENT:     Head: Normocephalic and atraumatic.  Eyes:     Conjunctiva/sclera: Conjunctivae normal.  Neck:     Musculoskeletal:  Normal range of motion.  Cardiovascular:     Rate and Rhythm: Normal rate and regular rhythm.     Heart sounds: Normal heart sounds.  Pulmonary:     Effort: Pulmonary effort is normal.     Breath sounds: Normal breath sounds.  Abdominal:     General: Bowel sounds are normal.     Palpations: Abdomen is soft.  Lymphadenopathy:     Cervical: No cervical adenopathy.    Skin:    General: Skin is warm and dry.     Capillary Refill: Capillary refill takes less than 2 seconds.  Neurological:     Mental Status: She is alert and oriented to person, place, and time.  Psychiatric:        Behavior: Behavior normal.      Musculoskeletal Exam: Spine thoracic lumbar spine good range of motion.  Shoulder joints elbow joints wrist joint MCPs PIPs DIPs been good range of motion with no synovitis.  She has DIP and PIP thickening only consistent with osteoarthritis.  Hip joints knee joints ankles MTPs PIPs been good range of motion with no synovitis. CDAI Exam: CDAI Score: 0.4  Patient Global Assessment: 2 (mm); Provider Global Assessment: 2 (mm) Swollen: 0 ; Tender: 0  Joint Exam   Not documented   There is currently no information documented on the homunculus. Go to the Rheumatology activity and complete the homunculus joint exam.  Investigation: No additional findings.  Imaging: No results found.  Recent Labs: Lab Results  Component Value Date   WBC 6.2 04/30/2018   HGB 13.9 04/30/2018   PLT 353 04/30/2018   NA 138 04/30/2018   K 4.6 04/30/2018   CL 99 04/30/2018   CO2 31 04/30/2018   GLUCOSE 83 04/30/2018   BUN 12 04/30/2018   CREATININE 0.79 04/30/2018   BILITOT 0.5 04/30/2018   ALKPHOS 59 05/14/2016   AST 26 04/30/2018   ALT 21 04/30/2018   PROT 7.0 04/30/2018   ALBUMIN 4.5 05/14/2016   CALCIUM 10.9 (H) 04/30/2018   GFRAA 84 04/30/2018   QFTBGOLDPLUS NEGATIVE 03/05/2018    Speciality Comments: no baseline biologic labs  Procedures:  No procedures performed Allergies: Patient has no known allergies.   Assessment / Plan:     Visit Diagnoses: Rheumatoid arthritis of multiple sites with negative rheumatoid factor (HCC)-patient has no synovitis on examination today.  She is doing extremely well on combination of Cimzia and methotrexate.  Have advised her to try reducing methotrexate to 0.7 mL subcu weekly.  If she has increased symptoms  she can go back to the previous dose.  High risk medication use - Cimzia, MTX 0.8 ml sq q wk, folic acid 54m po qd. (switched to SQ MTX 10/2016 due to frequent flares). -She had her subcu Cimzia injection today in the office.  Plan: CBC with Differential/Platelet, COMPLETE METABOLIC PANEL WITH GFR  Primary osteoarthritis of both hands-protection muscle strengthening was discussed.  Dyslipidemia-patient brought her reports today which showed elevated triglycerides and LDL.  Dietary modifications were discussed.  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  Resistive exercises were discussed.  History of Clostridium difficile infection   Orders: Orders Placed This Encounter  Procedures  . CBC with Differential/Platelet  . COMPLETE METABOLIC PANEL WITH GFR   Meds ordered this encounter  Medications  . Certolizumab Pegol KIT 400 mg      Follow-Up Instructions: Return in about 5 months (around 12/21/2018) for  Rheumatoid arthritis, Osteoarthritis.   Bo Merino, MD  Note - This record has been created using Editor, commissioning.  Chart creation errors have been sought, but may not always  have been located. Such creation errors do not reflect on  the standard of medical care.

## 2018-07-23 ENCOUNTER — Encounter: Payer: Self-pay | Admitting: Rheumatology

## 2018-07-23 ENCOUNTER — Ambulatory Visit (INDEPENDENT_AMBULATORY_CARE_PROVIDER_SITE_OTHER): Payer: MEDICARE | Admitting: Rheumatology

## 2018-07-23 VITALS — BP 139/83 | HR 79 | Resp 12 | Ht 63.0 in | Wt 152.4 lb

## 2018-07-23 DIAGNOSIS — T372X5A Adverse effect of antimalarials and drugs acting on other blood protozoa, initial encounter: Secondary | ICD-10-CM

## 2018-07-23 DIAGNOSIS — Z79899 Other long term (current) drug therapy: Secondary | ICD-10-CM

## 2018-07-23 DIAGNOSIS — M19042 Primary osteoarthritis, left hand: Secondary | ICD-10-CM

## 2018-07-23 DIAGNOSIS — M19041 Primary osteoarthritis, right hand: Secondary | ICD-10-CM | POA: Diagnosis not present

## 2018-07-23 DIAGNOSIS — H35389 Toxic maculopathy, unspecified eye: Secondary | ICD-10-CM

## 2018-07-23 DIAGNOSIS — M0609 Rheumatoid arthritis without rheumatoid factor, multiple sites: Secondary | ICD-10-CM

## 2018-07-23 DIAGNOSIS — M8589 Other specified disorders of bone density and structure, multiple sites: Secondary | ICD-10-CM

## 2018-07-23 DIAGNOSIS — Z8619 Personal history of other infectious and parasitic diseases: Secondary | ICD-10-CM

## 2018-07-23 DIAGNOSIS — E785 Hyperlipidemia, unspecified: Secondary | ICD-10-CM

## 2018-07-23 LAB — CBC WITH DIFFERENTIAL/PLATELET
Absolute Monocytes: 580 cells/uL (ref 200–950)
Basophils Absolute: 83 cells/uL (ref 0–200)
Basophils Relative: 1.2 %
Eosinophils Absolute: 138 cells/uL (ref 15–500)
Eosinophils Relative: 2 %
HCT: 40.9 % (ref 35.0–45.0)
Hemoglobin: 14 g/dL (ref 11.7–15.5)
Lymphs Abs: 1973 cells/uL (ref 850–3900)
MCH: 32.6 pg (ref 27.0–33.0)
MCHC: 34.2 g/dL (ref 32.0–36.0)
MCV: 95.1 fL (ref 80.0–100.0)
MPV: 9 fL (ref 7.5–12.5)
Monocytes Relative: 8.4 %
Neutro Abs: 4126 cells/uL (ref 1500–7800)
Neutrophils Relative %: 59.8 %
Platelets: 352 10*3/uL (ref 140–400)
RBC: 4.3 10*6/uL (ref 3.80–5.10)
RDW: 13.1 % (ref 11.0–15.0)
Total Lymphocyte: 28.6 %
WBC: 6.9 10*3/uL (ref 3.8–10.8)

## 2018-07-23 LAB — COMPLETE METABOLIC PANEL WITH GFR
AG Ratio: 1.9 (calc) (ref 1.0–2.5)
ALT: 24 U/L (ref 6–29)
AST: 27 U/L (ref 10–35)
Albumin: 4.6 g/dL (ref 3.6–5.1)
Alkaline phosphatase (APISO): 81 U/L (ref 37–153)
BUN: 17 mg/dL (ref 7–25)
CO2: 31 mmol/L (ref 20–32)
Calcium: 10.5 mg/dL — ABNORMAL HIGH (ref 8.6–10.4)
Chloride: 98 mmol/L (ref 98–110)
Creat: 0.66 mg/dL (ref 0.60–0.93)
GFR, Est African American: 99 mL/min/{1.73_m2} (ref >=60)
GFR, Est Non African American: 85 mL/min/{1.73_m2} (ref >=60)
Globulin: 2.4 g/dL (calc) (ref 1.9–3.7)
Glucose, Bld: 78 mg/dL (ref 65–99)
Potassium: 4.2 mmol/L (ref 3.5–5.3)
Sodium: 137 mmol/L (ref 135–146)
Total Bilirubin: 0.4 mg/dL (ref 0.2–1.2)
Total Protein: 7 g/dL (ref 6.1–8.1)

## 2018-07-23 MED ORDER — CERTOLIZUMAB PEGOL 2 X 200 MG ~~LOC~~ KIT
400.0000 mg | PACK | Freq: Once | SUBCUTANEOUS | Status: AC
  Administered 2018-07-23: 400 mg via SUBCUTANEOUS

## 2018-07-23 NOTE — Progress Notes (Signed)
Administrations This Visit    Certolizumab Pegol KIT 400 mg    Admin Date 07/23/2018 Action Given Dose 400 mg Route Subcutaneous Administered By Carole Binning, LPN         Patient given Cimzia injection in right and left thighs. patient tolerated injections well. Patient denies infection, use of antibiotics and fever. Patient will return in 1 month for next injection.

## 2018-07-23 NOTE — Patient Instructions (Signed)
Standing Labs We placed an order today for your standing lab work.    Please come back and get your standing labs in May and every 3 months  We have open lab Monday through Friday from 8:30-11:30 AM and 1:30-4:00 PM  at the office of Dr. Jed Kutch.   You may experience shorter wait times on Monday and Friday afternoons. The office is located at [address], [address], [place], [place] [address] No appointment is necessary.   Labs are drawn by [facility].  You may receive a bill from [facility] for your lab work.  If you wish to have your labs drawn at another location, please call the office 24 hours in advance to send orders.  If you have any questions regarding directions or hours of operation,  please call [phone].   Just as a reminder please drink plenty of water prior to coming for your lab work. Thanks!  

## 2018-07-24 NOTE — Progress Notes (Signed)
Wnl except Ca mildly elevated

## 2018-08-06 ENCOUNTER — Ambulatory Visit: Payer: MEDICARE | Admitting: Rheumatology

## 2018-08-20 ENCOUNTER — Ambulatory Visit: Payer: MEDICARE

## 2018-08-20 ENCOUNTER — Ambulatory Visit (INDEPENDENT_AMBULATORY_CARE_PROVIDER_SITE_OTHER): Payer: MEDICARE | Admitting: *Deleted

## 2018-08-20 VITALS — BP 139/83 | HR 78

## 2018-08-20 DIAGNOSIS — M0609 Rheumatoid arthritis without rheumatoid factor, multiple sites: Secondary | ICD-10-CM

## 2018-08-20 MED ORDER — CERTOLIZUMAB PEGOL 2 X 200 MG ~~LOC~~ KIT
400.0000 mg | PACK | Freq: Once | SUBCUTANEOUS | Status: AC
  Administered 2018-08-20: 400 mg via SUBCUTANEOUS

## 2018-08-20 NOTE — Progress Notes (Signed)
Patient in office for a Cimzia injection. Patient denies any infection, antibiotic use or fever. Patient was given injections in right and left thighs. Patient tolerated injections well. Patient will return in 1 month for next injections.   Administrations This Visit    Certolizumab Pegol KIT 400 mg    Admin Date 08/20/2018 Action Given Dose 400 mg Route Subcutaneous Administered By Carole Binning, LPN

## 2018-09-01 ENCOUNTER — Other Ambulatory Visit: Payer: Self-pay | Admitting: Rheumatology

## 2018-09-01 NOTE — Telephone Encounter (Addendum)
To early for refill  

## 2018-09-17 ENCOUNTER — Ambulatory Visit: Payer: Self-pay

## 2018-09-18 ENCOUNTER — Other Ambulatory Visit: Payer: Self-pay | Admitting: Rheumatology

## 2018-09-18 NOTE — Telephone Encounter (Signed)
Last Visit: 07/23/18 Next Visit: 12/29/18 Labs: 07/23/2018 Wnl except Ca mildly elevated  Okay to refill per Dr. Corliss Skains

## 2018-10-22 ENCOUNTER — Ambulatory Visit (INDEPENDENT_AMBULATORY_CARE_PROVIDER_SITE_OTHER): Payer: MEDICARE | Admitting: *Deleted

## 2018-10-22 ENCOUNTER — Other Ambulatory Visit: Payer: Self-pay

## 2018-10-22 VITALS — BP 131/85 | HR 84

## 2018-10-22 DIAGNOSIS — M0609 Rheumatoid arthritis without rheumatoid factor, multiple sites: Secondary | ICD-10-CM | POA: Diagnosis not present

## 2018-10-22 MED ORDER — CERTOLIZUMAB PEGOL 2 X 200 MG ~~LOC~~ KIT
400.0000 mg | PACK | Freq: Once | SUBCUTANEOUS | Status: AC
  Administered 2018-10-22: 400 mg via SUBCUTANEOUS

## 2018-10-22 NOTE — Progress Notes (Signed)
Patient in the office for Cimzia injection. Patient denies fever, illness or [place] of antibiotics. Patient was given injections in right and left thigh. Patient tolerated injection well. Patient will return in 1 month for next injection.

## 2018-11-19 ENCOUNTER — Ambulatory Visit (INDEPENDENT_AMBULATORY_CARE_PROVIDER_SITE_OTHER): Payer: MEDICARE | Admitting: *Deleted

## 2018-11-19 ENCOUNTER — Other Ambulatory Visit: Payer: Self-pay

## 2018-11-19 VITALS — BP 127/83 | HR 79

## 2018-11-19 DIAGNOSIS — M0609 Rheumatoid arthritis without rheumatoid factor, multiple sites: Secondary | ICD-10-CM | POA: Diagnosis not present

## 2018-11-19 MED ORDER — CERTOLIZUMAB PEGOL 2 X 200 MG ~~LOC~~ KIT
400.0000 mg | PACK | Freq: Once | SUBCUTANEOUS | Status: AC
  Administered 2018-11-19: 400 mg via SUBCUTANEOUS

## 2018-11-19 NOTE — Progress Notes (Signed)
Patient in office for a Cimzia injection. Patient denied any fever, use of antibiotics or infection. Patient was given injection in right and left thigh. Patient tolerated injection well. Patient will return in 1 month for next injection and to follow up with Dr. Estanislado Pandy.   Administrations This Visit    Certolizumab Pegol KIT 400 mg    Admin Date 11/19/2018 Action Given Dose 400 mg Route Subcutaneous Administered By Carole Binning, LPN

## 2018-11-30 ENCOUNTER — Other Ambulatory Visit: Payer: Self-pay | Admitting: Rheumatology

## 2018-11-30 NOTE — Telephone Encounter (Signed)
Last Visit: 07/23/2018 Next Visit: 12/17/2018 Labs: 07/23/2018 Wnl except Ca mildly elevated.  Advised patient she is due to update labs. patient verbalized understanding and will get labs drawn at 12/17/2018 visit.   Okay to refill 30 day supply, per Dr. Estanislado Pandy.

## 2018-12-01 ENCOUNTER — Telehealth: Payer: Self-pay | Admitting: Pharmacist

## 2018-12-01 NOTE — Telephone Encounter (Signed)
Received a Prior Authorization request from [facility] for methotrexate. Authorization has been submitted to patient's insurance via Cover My Meds. Will update once we receive a response.  (KeyJeris Penta) - [id number]

## 2018-12-03 NOTE — Progress Notes (Signed)
Office Visit Note  Patient: April Shepard             Date of Birth: 1940/10/07           MRN: [id number]             PCP: Colbert Coyer, MD Referring: Colbert Coyer, MD Visit Date: 12/17/2018 Occupation: @GUAROCC @  Subjective:  No chief complaint on file.    She is on calcium and vitamin D supplementation daily. She also does resistance exercises. Last DXA 07/28/14 showed a T-score of -1.8 at left hip.  Due for repeat DXA.   History of Present Illness: April Shepard is a 78 y.o. female with history of rheumatoid arthritis.  She states she has been doing much better on combination of Cimzia and methotrexate.  She is reduce methotrexate 0.7 mL/week.  She has noticed joint swelling currently.  Activities of Daily Living:  Patient reports morning stiffness for 0 minutes.   Patient Denies nocturnal pain.  Difficulty dressing/grooming: Denies Difficulty climbing stairs: Denies Difficulty getting out of chair: Denies Difficulty using hands for taps, buttons, cutlery, and/or writing: Denies  Review of Systems  Constitutional: Negative for fatigue.  HENT: Negative for mouth sores, mouth dryness and nose dryness.   Eyes: Negative for itching and dryness.  Respiratory: Negative for shortness of breath and difficulty breathing.   Cardiovascular: Negative for chest pain, palpitations and swelling in legs/feet.  Gastrointestinal: Negative for blood in stool, constipation and diarrhea.  Endocrine: Negative for increased urination.  Genitourinary: Negative for painful urination and pelvic pain.  Musculoskeletal: Negative for arthralgias, joint pain, joint swelling and morning stiffness.  Skin: Negative for rash, hair loss and redness.  Allergic/Immunologic: Negative for susceptible to infections.  Neurological: Negative for dizziness, light-headedness, headaches, memory loss and weakness.  Hematological: Negative for bruising/bleeding tendency.  Psychiatric/Behavioral:  Negative for confusion. The patient is not nervous/anxious.     PMFS History:  Patient Active Problem List   Diagnosis Date Noted  . Dyslipidemia 07/23/2018  . 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 High risk medication use 10/22/2016  . Trigger finger, right ring finger 10/22/2016  . Osteopenia 05/13/2016  . Osteoarthritis of both hands 05/13/2016  . RA (rheumatoid arthritis) (HCC) 05/13/2016    Past Medical History:  Diagnosis Date  . Osteoarthritis of both hands 05/13/2016  . Osteopenia 05/13/2016  . RA (rheumatoid arthritis) (HCC) 05/13/2016    Family History  Problem Relation Age of Onset  . Breast cancer Mother   . Heart attack Father   . Melanoma Brother    Past Surgical History:  Procedure Laterality Date  . CHOLECYSTECTOMY     Social History   Social History Narrative  . Not on file   Immunization History  Administered Date(s) Administered  . Influenza, High Dose Seasonal PF 03/27/2018     Objective: Vital Signs: BP 136/79 (BP Location: Left Arm, Patient Position: Sitting, Cuff Size: Normal)   Pulse 71   Resp 13   Ht 5\' 3"  (1.6 m)   Wt 149 lb 9.6 oz (67.9 kg)   BMI 26.50 kg/m    Physical Exam Vitals signs and nursing note reviewed.  Constitutional:      Appearance: She is well-developed.  HENT:     Head: Normocephalic and atraumatic.  Eyes:     Conjunctiva/sclera: Conjunctivae normal.  Neck:     Musculoskeletal: Normal range of motion.  Cardiovascular:     Rate and Rhythm: Normal rate  and regular rhythm.     Heart sounds: Normal heart sounds.  Pulmonary:     Effort: Pulmonary effort is normal.     Breath sounds: Normal breath sounds.  Abdominal:     General: Bowel sounds are normal.     Palpations: Abdomen is soft.  Lymphadenopathy:     Cervical: No cervical adenopathy.  Skin:    General: Skin is warm and dry.     Capillary Refill: Capillary refill takes less than 2 seconds.  Neurological:     Mental Status:  She is alert and oriented to person, place, and time.  Psychiatric:        Behavior: Behavior normal.      Musculoskeletal Exam: C-spine thoracic and lumbar spine with good range of motion.  Shoulder joints elbow joints wrist joints with good range of motion.  She has synovial thickening over MCP joints with no synovitis.  She has PIP and DIP thickening with no synovitis.  Hip joints knee joints ankle joints MTPs PIPs DIPs been good range of motion with some synovial thickening over MTP joints.  CDAI Exam: CDAI Score: 0.2  Patient Global: 1 mm; Provider Global: 1 mm Swollen: 0 ; Tender: 0  Joint Exam   No joint exam has been documented for this visit   There is currently no information documented on the homunculus. Go to the Rheumatology activity and complete the homunculus joint exam.  Investigation: No additional findings.  Imaging: No results found.  Recent Labs: Lab Results  Component Value Date   WBC 6.9 07/23/2018   HGB 14.0 07/23/2018   PLT 352 07/23/2018   NA 137 07/23/2018   K 4.2 07/23/2018   CL 98 07/23/2018   CO2 31 07/23/2018   GLUCOSE 78 07/23/2018   BUN 17 07/23/2018   CREATININE 0.66 07/23/2018   BILITOT 0.4 07/23/2018   ALKPHOS 59 05/14/2016   AST 27 07/23/2018   ALT 24 07/23/2018   PROT 7.0 07/23/2018   ALBUMIN 4.5 05/14/2016   CALCIUM 10.5 (H) 07/23/2018   GFRAA 99 07/23/2018   QFTBGOLDPLUS NEGATIVE 03/05/2018    Speciality Comments: no baseline biologic labs  Procedures:  No procedures performed Allergies: Patient has no known allergies.   Assessment / Plan:     Visit Diagnoses: Rheumatoid arthritis of multiple sites with negative rheumatoid factor (HCC) -patient had no synovitis on examination today.  She continues to have some synovial thickening.  She is very satisfied with her current treatment.    High risk medication use - Cimzia in office injection 400 mg every 28 days (last injection on 11/19/2018), methotrexate 0.60mL every 7 days,  and folic acid 1 mg 2 tablets daily.  Last TB gold negative on 03/05/2018 and will monitor yearly.  Most recent CBC/CMP within normal limits except for mildly elevated calcium on 07/23/2018.  She is overdue for labs.  CBC/CMP ordered for today and will monitor every 3 months.  Standing orders placed.  She received a high-dose flu vaccine in October.  Recommend annual influenza, Pneumovax 23, Prevnar 13, and Shingrix as indicated for immunosuppressant therapy. - Plan: CBC with Differential/Platelet, COMPLETE METABOLIC PANEL WITH GFR,   Primary osteoarthritis of both hands - Plan: Joint protection discussed.  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  Resistive exercises were discussed.  History of Clostridium difficile infection   Dyslipidemia - Plan: She is on treatment.  Trigger finger, right ring  finger -she only has intermittent issues with her trigger finger.  Orders: Orders Placed This Encounter  Procedures  . CBC with Differential/Platelet  . COMPLETE METABOLIC PANEL WITH GFR   No orders of the defined types were placed in this encounter.     Follow-Up Instructions: No follow-ups on file.   Bo Merino, MD  Note - This record has been created using Editor, commissioning.  Chart creation errors have been sought, but may not always  have been located. Such creation errors do not reflect on  the standard of medical care.

## 2018-12-17 ENCOUNTER — Other Ambulatory Visit: Payer: Self-pay

## 2018-12-17 ENCOUNTER — Encounter: Payer: Self-pay | Admitting: Rheumatology

## 2018-12-17 ENCOUNTER — Ambulatory Visit (INDEPENDENT_AMBULATORY_CARE_PROVIDER_SITE_OTHER): Payer: MEDICARE | Admitting: Rheumatology

## 2018-12-17 VITALS — BP 136/79 | HR 71 | Resp 13 | Ht 63.0 in | Wt 149.6 lb

## 2018-12-17 DIAGNOSIS — Z79899 Other long term (current) drug therapy: Secondary | ICD-10-CM

## 2018-12-17 DIAGNOSIS — M0609 Rheumatoid arthritis without rheumatoid factor, multiple sites: Secondary | ICD-10-CM

## 2018-12-17 DIAGNOSIS — M8589 Other specified disorders of bone density and structure, multiple sites: Secondary | ICD-10-CM

## 2018-12-17 DIAGNOSIS — E785 Hyperlipidemia, unspecified: Secondary | ICD-10-CM

## 2018-12-17 DIAGNOSIS — H35389 Toxic maculopathy, unspecified eye: Secondary | ICD-10-CM

## 2018-12-17 DIAGNOSIS — M19042 Primary osteoarthritis, left hand: Secondary | ICD-10-CM

## 2018-12-17 DIAGNOSIS — T372X5A Adverse effect of antimalarials and drugs acting on other blood protozoa, initial encounter: Secondary | ICD-10-CM

## 2018-12-17 DIAGNOSIS — M65341 Trigger finger, right ring finger: Secondary | ICD-10-CM

## 2018-12-17 DIAGNOSIS — Z8619 Personal history of other infectious and parasitic diseases: Secondary | ICD-10-CM

## 2018-12-17 DIAGNOSIS — M19041 Primary osteoarthritis, right hand: Secondary | ICD-10-CM

## 2018-12-17 MED ORDER — CERTOLIZUMAB PEGOL 2 X 200 MG ~~LOC~~ KIT
400.0000 mg | PACK | Freq: Once | SUBCUTANEOUS | Status: AC
  Administered 2018-12-17: 400 mg via SUBCUTANEOUS

## 2018-12-17 NOTE — Addendum Note (Signed)
Addended by: Carole Binning on: 12/17/2018 11:28 AM   Modules accepted: Orders

## 2018-12-17 NOTE — Progress Notes (Signed)
Patient given Cimzia injection in office. Patient denies infection, use of antibiotic and fever. Patient given injections in right and left thigh. Patient tolerated injection well. Patient to return in one month for next injection.   Administrations This Visit    Certolizumab Pegol KIT 400 mg    Admin Date 12/17/2018 Action Given Dose 400 mg Route Subcutaneous Administered By Carole Binning, LPN

## 2018-12-18 LAB — COMPLETE METABOLIC PANEL WITH GFR
AG Ratio: 2 (calc) (ref 1.0–2.5)
ALT: 19 U/L (ref 6–29)
AST: 25 U/L (ref 10–35)
Albumin: 4.5 g/dL (ref 3.6–5.1)
Alkaline phosphatase (APISO): 75 U/L (ref 37–153)
BUN: 16 mg/dL (ref 7–25)
CO2: 29 mmol/L (ref 20–32)
Calcium: 10 mg/dL (ref 8.6–10.4)
Chloride: 99 mmol/L (ref 98–110)
Creat: 0.66 mg/dL (ref 0.60–0.93)
GFR, Est African American: 98 mL/min/{1.73_m2} (ref >=60)
GFR, Est Non African American: 85 mL/min/{1.73_m2} (ref >=60)
Globulin: 2.3 g/dL (calc) (ref 1.9–3.7)
Glucose, Bld: 85 mg/dL (ref 65–99)
Potassium: 4.2 mmol/L (ref 3.5–5.3)
Sodium: 136 mmol/L (ref 135–146)
Total Bilirubin: 0.5 mg/dL (ref 0.2–1.2)
Total Protein: 6.8 g/dL (ref 6.1–8.1)

## 2018-12-18 LAB — CBC WITH DIFFERENTIAL/PLATELET
Absolute Monocytes: 623 cells/uL (ref 200–950)
Basophils Absolute: 60 cells/uL (ref 0–200)
Basophils Relative: 0.9 %
Eosinophils Absolute: 121 cells/uL (ref 15–500)
Eosinophils Relative: 1.8 %
HCT: 42.2 % (ref 35.0–45.0)
Hemoglobin: 14.3 g/dL (ref 11.7–15.5)
Lymphs Abs: 1548 cells/uL (ref 850–3900)
MCH: 31.9 pg (ref 27.0–33.0)
MCHC: 33.9 g/dL (ref 32.0–36.0)
MCV: 94.2 fL (ref 80.0–100.0)
MPV: 8.7 fL (ref 7.5–12.5)
Monocytes Relative: 9.3 %
Neutro Abs: 4348 cells/uL (ref 1500–7800)
Neutrophils Relative %: 64.9 %
Platelets: 318 10*3/uL (ref 140–400)
RBC: 4.48 10*6/uL (ref 3.80–5.10)
RDW: 13 % (ref 11.0–15.0)
Total Lymphocyte: 23.1 %
WBC: 6.7 10*3/uL (ref 3.8–10.8)

## 2018-12-21 ENCOUNTER — Other Ambulatory Visit: Payer: Self-pay | Admitting: Rheumatology

## 2018-12-21 NOTE — Telephone Encounter (Signed)
Last Visit: 12/17/18 Next Visit: 01/14/19 Labs: 12/17/18 WNL   Okay to refill per Dr. Estanislado Pandy

## 2018-12-29 ENCOUNTER — Ambulatory Visit: Payer: Self-pay | Admitting: Rheumatology

## 2019-01-14 ENCOUNTER — Ambulatory Visit (INDEPENDENT_AMBULATORY_CARE_PROVIDER_SITE_OTHER): Payer: MEDICARE | Admitting: *Deleted

## 2019-01-14 ENCOUNTER — Other Ambulatory Visit: Payer: Self-pay

## 2019-01-14 VITALS — BP 156/85 | HR 71

## 2019-01-14 DIAGNOSIS — M0609 Rheumatoid arthritis without rheumatoid factor, multiple sites: Secondary | ICD-10-CM | POA: Diagnosis not present

## 2019-01-14 MED ORDER — CERTOLIZUMAB PEGOL 2 X 200 MG ~~LOC~~ KIT
400.0000 mg | PACK | Freq: Once | SUBCUTANEOUS | Status: AC
  Administered 2019-01-14: 400 mg via SUBCUTANEOUS

## 2019-01-14 NOTE — Progress Notes (Signed)
Patient in office for Cimzia injection. Patient denies fever, infection or use of antibiotics. Patient was given injection in Right and left thighs and tolerated well. Patient will return in one month for next injection.   Administrations This Visit    Certolizumab Pegol KIT 400 mg    Admin Date 01/14/2019 Action Given Dose 400 mg Route Subcutaneous Administered By Carole Binning, LPN

## 2019-01-27 ENCOUNTER — Telehealth: Payer: Self-pay | Admitting: Rheumatology

## 2019-01-27 NOTE — Telephone Encounter (Signed)
When is her oral surgery scheduled?

## 2019-01-27 NOTE — Telephone Encounter (Signed)
Patient called to check if her Cimzia medication would interfere with oral surgery.  Patient is requesting a return call today.

## 2019-01-28 NOTE — Telephone Encounter (Signed)
Spoke with patient and she has not scheduled her oral surgery at this time. She is due to have 2 teeth extracted. She was advised to speak with [place] before scheduling to see if it would interfere with Cimzia. Per Dr. Estanislado Pandy patient should schedule the oral when Cimzia is due (02/11/19) and delay her Cimzia a week provided there is no infection. Patient advised.

## 2019-02-01 ENCOUNTER — Telehealth: Payer: Self-pay | Admitting: Rheumatology

## 2019-02-01 NOTE — Telephone Encounter (Signed)
Patient left a message requesting a call back from Seth Bake to discuss rescheduling her injection appointment.

## 2019-02-02 NOTE — Telephone Encounter (Signed)
Patient states she is needing to reschedule her to Cimzia due having oral surgery. Patient will call to reschedule after her procedure has been performed. Patient is aware to have the procedure around the time her next Cimzia is due.

## 2019-02-11 ENCOUNTER — Ambulatory Visit: Payer: MEDICARE

## 2019-02-25 ENCOUNTER — Ambulatory Visit (INDEPENDENT_AMBULATORY_CARE_PROVIDER_SITE_OTHER): Payer: MEDICARE | Admitting: *Deleted

## 2019-02-25 ENCOUNTER — Other Ambulatory Visit: Payer: Self-pay

## 2019-02-25 VITALS — BP 147/84 | HR 69

## 2019-02-25 DIAGNOSIS — M0609 Rheumatoid arthritis without rheumatoid factor, multiple sites: Secondary | ICD-10-CM | POA: Diagnosis not present

## 2019-02-25 MED ORDER — CERTOLIZUMAB PEGOL 2 X 200 MG ~~LOC~~ KIT
400.0000 mg | PACK | Freq: Once | SUBCUTANEOUS | Status: AC
  Administered 2019-02-25: 400 mg via SUBCUTANEOUS

## 2019-02-25 NOTE — Progress Notes (Signed)
Patient in office for Cimzia injection. Patient denies fever, infection or use of antibiotics.  Patient was given injections in right and left thigh. Patient tolerated injections well. Patient will return in one month for next injection and labs.   Administrations This Visit    Certolizumab Pegol KIT 400 mg    Admin Date 02/25/2019 Action Given Dose 400 mg Route Subcutaneous Administered By Carole Binning, LPN

## 2019-03-15 ENCOUNTER — Other Ambulatory Visit: Payer: Self-pay | Admitting: Rheumatology

## 2019-03-15 NOTE — Telephone Encounter (Signed)
Last Visit: 12/17/18 Next Visit: 05/19/19 Labs: 12/17/18 WNL  Okay to refill per Dr. Estanislado Pandy

## 2019-03-25 ENCOUNTER — Ambulatory Visit (INDEPENDENT_AMBULATORY_CARE_PROVIDER_SITE_OTHER): Payer: MEDICARE | Admitting: *Deleted

## 2019-03-25 ENCOUNTER — Other Ambulatory Visit: Payer: Self-pay

## 2019-03-25 VITALS — BP 144/78 | HR 80

## 2019-03-25 DIAGNOSIS — M0609 Rheumatoid arthritis without rheumatoid factor, multiple sites: Secondary | ICD-10-CM

## 2019-03-25 DIAGNOSIS — Z79899 Other long term (current) drug therapy: Secondary | ICD-10-CM

## 2019-03-25 DIAGNOSIS — M8589 Other specified disorders of bone density and structure, multiple sites: Secondary | ICD-10-CM

## 2019-03-25 LAB — VITAMIN D 25 HYDROXY (VIT D DEFICIENCY, FRACTURES): Vit D, 25-Hydroxy: 33 ng/mL (ref 30–100)

## 2019-03-25 MED ORDER — CERTOLIZUMAB PEGOL 2 X 200 MG ~~LOC~~ KIT
400.0000 mg | PACK | Freq: Once | SUBCUTANEOUS | Status: AC
  Administered 2019-03-25: 400 mg via SUBCUTANEOUS

## 2019-03-25 NOTE — Progress Notes (Signed)
Patient in office for a Cimzia injection. Patient denies fever, infection and use of antibiotics. Patient was given injections in right and left thighs. Patient tolerated injections well. Patient will return in 1 month for next injection. Patient had labs while in office.   Administrations This Visit    Certolizumab Pegol KIT 400 mg    Admin Date 03/25/2019 Action Given Dose 400 mg Route Subcutaneous Administered By Carole Binning, LPN

## 2019-03-27 ENCOUNTER — Other Ambulatory Visit: Payer: Self-pay | Admitting: Rheumatology

## 2019-03-28 LAB — QUANTIFERON-TB GOLD PLUS
Mitogen-NIL: 9.14 IU/mL
NIL: 0.09 IU/mL
QuantiFERON-TB Gold Plus: NEGATIVE
TB1-NIL: 0.02 IU/mL
TB2-NIL: 0 IU/mL

## 2019-03-28 LAB — CBC WITH DIFFERENTIAL/PLATELET
Absolute Monocytes: 499 cells/uL (ref 200–950)
Basophils Absolute: 70 cells/uL (ref 0–200)
Basophils Relative: 1.2 %
Eosinophils Absolute: 110 cells/uL (ref 15–500)
Eosinophils Relative: 1.9 %
HCT: 40.2 % (ref 35.0–45.0)
Hemoglobin: 13.8 g/dL (ref 11.7–15.5)
Lymphs Abs: 1734 cells/uL (ref 850–3900)
MCH: 32.6 pg (ref 27.0–33.0)
MCHC: 34.3 g/dL (ref 32.0–36.0)
MCV: 95 fL (ref 80.0–100.0)
MPV: 8.5 fL (ref 7.5–12.5)
Monocytes Relative: 8.6 %
Neutro Abs: 3387 cells/uL (ref 1500–7800)
Neutrophils Relative %: 58.4 %
Platelets: 344 10*3/uL (ref 140–400)
RBC: 4.23 10*6/uL (ref 3.80–5.10)
RDW: 13 % (ref 11.0–15.0)
Total Lymphocyte: 29.9 %
WBC: 5.8 10*3/uL (ref 3.8–10.8)

## 2019-03-28 LAB — COMPLETE METABOLIC PANEL WITH GFR
AG Ratio: 1.9 (calc) (ref 1.0–2.5)
ALT: 22 U/L (ref 6–29)
AST: 27 U/L (ref 10–35)
Albumin: 4.6 g/dL (ref 3.6–5.1)
Alkaline phosphatase (APISO): 85 U/L (ref 37–153)
BUN: 21 mg/dL (ref 7–25)
CO2: 26 mmol/L (ref 20–32)
Calcium: 10.1 mg/dL (ref 8.6–10.4)
Chloride: 99 mmol/L (ref 98–110)
Creat: 0.68 mg/dL (ref 0.60–0.93)
GFR, Est African American: 97 mL/min/{1.73_m2} (ref >=60)
GFR, Est Non African American: 84 mL/min/{1.73_m2} (ref >=60)
Globulin: 2.4 g/dL (calc) (ref 1.9–3.7)
Glucose, Bld: 77 mg/dL (ref 65–99)
Potassium: 4 mmol/L (ref 3.5–5.3)
Sodium: 137 mmol/L (ref 135–146)
Total Bilirubin: 0.5 mg/dL (ref 0.2–1.2)
Total Protein: 7 g/dL (ref 6.1–8.1)

## 2019-03-29 NOTE — Telephone Encounter (Signed)
Last Visit:12/17/18 Next Visit:05/19/19  Okay to refill per Dr. Estanislado Pandy

## 2019-03-29 NOTE — Progress Notes (Signed)
Labs are stable.

## 2019-04-22 ENCOUNTER — Ambulatory Visit (INDEPENDENT_AMBULATORY_CARE_PROVIDER_SITE_OTHER): Payer: MEDICARE | Admitting: *Deleted

## 2019-04-22 ENCOUNTER — Other Ambulatory Visit: Payer: Self-pay

## 2019-04-22 VITALS — BP 148/77 | HR 81

## 2019-04-22 DIAGNOSIS — M0609 Rheumatoid arthritis without rheumatoid factor, multiple sites: Secondary | ICD-10-CM

## 2019-04-22 MED ORDER — CERTOLIZUMAB PEGOL 2 X 200 MG ~~LOC~~ KIT
400.0000 mg | PACK | Freq: Once | SUBCUTANEOUS | Status: AC
  Administered 2019-04-22: 400 mg via SUBCUTANEOUS

## 2019-04-22 NOTE — Progress Notes (Signed)
Patient in the office for Cimzia injection. Patient denies fever, infection or use of antibiotics. Patient received her injections in right and left thigh and tolerated well. Patient will return in one month for next injection.

## 2019-04-26 ENCOUNTER — Telehealth: Payer: Self-pay | Admitting: Rheumatology

## 2019-04-26 NOTE — Telephone Encounter (Signed)
Patient called stating she forgot to ask Dr. Estanislado Pandy at her appointment last week if it is okay to continue taking her cholesterol medication Atorvastatin 10 mg.

## 2019-04-27 NOTE — Telephone Encounter (Signed)
Patient states she had when she was in the office she forgot to mention starting the new medication. Patient advised the Lipitor should not interfere with the Cimzia.

## 2019-05-06 NOTE — Progress Notes (Deleted)
Office Visit Note  Patient: April Shepard             Date of Birth: 1941-04-13           MRN: [id number]             PCP: Artis Delay, MD Referring: Artis Delay, MD Visit Date: 05/20/2019 Occupation: @GUAROCC @  Subjective:  No chief complaint on file.   History of Present Illness: April Shepard is a 78 y.o. female ***   Activities of Daily Living:  Patient reports morning stiffness for *** {minute/hour:19697}.   Patient {ACTIONS;DENIES/REPORTS:21021675::"Denies"} nocturnal pain.  Difficulty dressing/grooming: {ACTIONS;DENIES/REPORTS:21021675::"Denies"} Difficulty climbing stairs: {ACTIONS;DENIES/REPORTS:21021675::"Denies"} Difficulty getting out of chair: {ACTIONS;DENIES/REPORTS:21021675::"Denies"} Difficulty using hands for taps, buttons, cutlery, and/or writing: {ACTIONS;DENIES/REPORTS:21021675::"Denies"}  No Rheumatology ROS completed.   PMFS History:  Patient Active Problem List   Diagnosis Date Noted  . Dyslipidemia 07/23/2018  . 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 High risk medication use 10/22/2016  . Trigger finger, right ring finger 10/22/2016  . Osteopenia 05/13/2016  . Osteoarthritis of both hands 05/13/2016  . RA (rheumatoid arthritis) ([place]) 05/13/2016    Past Medical History:  Diagnosis Date  . Osteoarthritis of both hands 05/13/2016  . Osteopenia 05/13/2016  . RA (rheumatoid arthritis) ([place]) 05/13/2016    Family History  Problem Relation Age of Onset  . Breast cancer Mother   . Heart attack Father   . Melanoma Brother    Past Surgical History:  Procedure Laterality Date  . CHOLECYSTECTOMY     Social History   Social History Narrative  . Not on file   Immunization History  Administered Date(s) Administered  . Influenza, High Dose Seasonal PF 03/27/2018     Objective: Vital Signs: There were no vitals taken for this visit.   Physical Exam   Musculoskeletal Exam: ***  CDAI Exam:  CDAI Score: - Patient Global: -; Provider Global: - Swollen: -; Tender: - Joint Exam   No joint exam has been documented for this visit   There is currently no information documented on the homunculus. Go to the Rheumatology activity and complete the homunculus joint exam.  Investigation: No additional findings.  Imaging: No results found.  Recent Labs: Lab Results  Component Value Date   WBC 5.8 03/25/2019   HGB 13.8 03/25/2019   PLT 344 03/25/2019   NA 137 03/25/2019   K 4.0 03/25/2019   CL 99 03/25/2019   CO2 26 03/25/2019   GLUCOSE 77 03/25/2019   BUN 21 03/25/2019   CREATININE 0.68 03/25/2019   BILITOT 0.5 03/25/2019   ALKPHOS 59 05/14/2016   AST 27 03/25/2019   ALT 22 03/25/2019   PROT 7.0 03/25/2019   ALBUMIN 4.5 05/14/2016   CALCIUM 10.1 03/25/2019   GFRAA 97 03/25/2019   QFTBGOLDPLUS NEGATIVE 03/25/2019    Speciality Comments: no baseline biologic labs  Procedures:  No procedures performed Allergies: Patient has no known allergies.   Assessment / Plan:     Visit Diagnoses: No diagnosis found.  Orders: No orders of the defined types were placed in this encounter.  No orders of the defined types were placed in this encounter.   Face-to-face time spent with patient was *** minutes. Greater than 50% of time was spent in counseling and coordination of care.  Follow-Up Instructions: No follow-ups on file.   Earnestine Mealing, CMA  Note - This record has been created using Editor, commissioning.  Chart creation errors have been sought,  but may not always  have been located. Such creation errors do not reflect on  the standard of medical care.

## 2019-05-19 ENCOUNTER — Ambulatory Visit: Payer: Self-pay | Admitting: Rheumatology

## 2019-05-20 ENCOUNTER — Ambulatory Visit: Payer: Self-pay | Admitting: Rheumatology

## 2019-06-21 NOTE — Progress Notes (Signed)
Office Visit Note  Patient: April Shepard             Date of Birth: [birth year]           MRN: [id number]             PCP: Artis Delay, MD Referring: Artis Delay, MD Visit Date: 06/23/2019 Occupation: _0 @  Subjective:  Medication monitoring   History of Present Illness: April Shepard is a 79 y.o. female with history of seronegative rheumatoid and osteoarthritis. She is on MTX 0.6 ml sq once weekly, folic acid 2 mg po daily, and Cimzia sq injections once monthly.  Her most recent injection of Cimzia was today in the office.  She denies any recent rheumatoid arthritis flares.  She states she is having some tenderness in her hands but no inflammation at this time.  She denies any other joint pain or joint swelling currently.   Activities of Daily Living:  Patient reports morning stiffness for 0  minutes.   Patient Denies nocturnal pain.  Difficulty dressing/grooming: Denies Difficulty climbing stairs: Denies Difficulty getting out of chair: Denies Difficulty using hands for taps, buttons, cutlery, and/or writing: Denies  Review of Systems  Constitutional: Negative for fatigue.  HENT: Negative for mouth sores, mouth dryness and nose dryness.   Eyes: Negative for itching and dryness.  Respiratory: Negative for shortness of breath, wheezing and difficulty breathing.   Cardiovascular: Negative for chest pain and palpitations.  Gastrointestinal: Negative for blood in stool, constipation and diarrhea.  Endocrine: Negative for increased urination.  Genitourinary: Negative for difficulty urinating.  Musculoskeletal: Positive for arthralgias and joint pain. Negative for joint swelling and morning stiffness.  Skin: Negative for rash and hair loss.  Allergic/Immunologic: Negative for susceptible to infections.  Neurological: Negative for dizziness, headaches, memory loss and weakness.  Hematological: Negative for bruising/bleeding tendency.    Psychiatric/Behavioral: Negative for confusion and sleep disturbance.    PMFS History:  Patient Active Problem List   Diagnosis Date Noted  . Dyslipidemia 07/23/2018  . 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 High risk medication use 10/22/2016  . Trigger finger, right ring finger 10/22/2016  . Osteopenia 05/13/2016  . Osteoarthritis of both hands 05/13/2016  . RA (rheumatoid arthritis) ([place]) 05/13/2016    Past Medical History:  Diagnosis Date  . Osteoarthritis of both hands 05/13/2016  . Osteopenia 05/13/2016  . RA (rheumatoid arthritis) ([place]) 05/13/2016    Family History  Problem Relation Age of Onset  . Breast cancer Mother   . Heart attack Father   . Melanoma Brother    Past Surgical History:  Procedure Laterality Date  . CHOLECYSTECTOMY     Social History   Social History Narrative  . Not on file   Immunization History  Administered Date(s) Administered  . Influenza, High Dose Seasonal PF 03/27/2018     Objective: Vital Signs: BP 136/81 (BP Location: Left Arm, Patient Position: Sitting, Cuff Size: Normal)   Pulse 68   Resp 13   Ht _1  (1.6 m)   Wt 142 lb (64.4 kg)   BMI 25.15 kg/m    Physical Exam Vitals and nursing note reviewed.  Constitutional:      Appearance: She is well-developed.  HENT:     Head: Normocephalic and atraumatic.  Eyes:     Conjunctiva/sclera: Conjunctivae normal.  Cardiovascular:     Rate and Rhythm: Normal rate and regular rhythm.     Heart sounds: Normal heart sounds.  Pulmonary:     Effort: Pulmonary effort is normal.     Breath sounds: Normal breath sounds.  Abdominal:     General: Bowel sounds are normal.     Palpations: Abdomen is soft.  Musculoskeletal:     Cervical back: Normal range of motion.  Lymphadenopathy:     Cervical: No cervical adenopathy.  Skin:    General: Skin is warm and dry.     Capillary Refill: Capillary refill takes less than 2 seconds.  Neurological:     Mental  Status: She is alert and oriented to person, place, and time.  Psychiatric:        Behavior: Behavior normal.      Musculoskeletal Exam: C-spine, thoracic spine, and lumbar spine good ROM.  No midline spinal tenderness.  No SI joint tenderness. Shoulder joints, elbow joints, wrist joints, MCPs, PIPs, and DIPs good ROM with no synovitis.  PIP and DIP synovial consistent with osteoarthritis of both hands. Right 2nd and 3rd MCP joint synovial thickening.  Right CMC joint synovial thickening.  Hip joints, knee joints, ankle joints, MTPs, PIPs, and DIPs good ROM with no synovitis.  No warmth or effusion of knee joints.  No tenderness or swelling of ankle joints.   CDAI Exam: CDAI Score: 0  Patient Global: 0 mm; Provider Global: 0 mm Swollen: 0 ; Tender: 0  Joint Exam 06/23/2019   No joint exam has been documented for this visit   There is currently no information documented on the homunculus. Go to the Rheumatology activity and complete the homunculus joint exam.  Investigation: No additional findings.  Imaging: No results found.  Recent Labs: Lab Results  Component Value Date   WBC 5.8 03/25/2019   HGB 13.8 03/25/2019   PLT 344 03/25/2019   NA 137 03/25/2019   K 4.0 03/25/2019   CL 99 03/25/2019   CO2 26 03/25/2019   GLUCOSE 77 03/25/2019   BUN 21 03/25/2019   CREATININE 0.68 03/25/2019   BILITOT 0.5 03/25/2019   ALKPHOS 59 05/14/2016   AST 27 03/25/2019   ALT 22 03/25/2019   PROT 7.0 03/25/2019   ALBUMIN 4.5 05/14/2016   CALCIUM 10.1 03/25/2019   GFRAA 97 03/25/2019   QFTBGOLDPLUS NEGATIVE 03/25/2019    Speciality Comments: no baseline biologic labs  Procedures:  No procedures performed Allergies: Patient has no known allergies.   Assessment / Plan:     Visit Diagnoses: Rheumatoid arthritis of multiple sites with negative rheumatoid factor ([place]): She has no synovitis on exam today.  She has synovial thickening of the right 2nd and 3rd MCP joints.  She has not had  any recent rheumatoid arthritis flares.  She is clinically doing well on Cimzia 400 mg sq injections every 28 days, MTX 0.6 ml sq once weekly, and folic acid 2 mg po daily.  She has no joint pain or joint swelling at this time.  She has no morning stiffness at this time.  She will continue on the current treatment regimen.  Refills of MTX and folic acid will be sent to the pharmacy.  She was advised to notify us if she develops increased joint pain or joint swelling.  She will follow up in 3 months.   High risk medication use - Cimzia in office injection 400 mg every 28 days, methotrexate 0.26m every 7 days, and folic acid 1 mg 2 tablets daily  She is due to update CBC and CMP. - Plan: CBC with diff, CMP with GFR  Toxic  maculopathy from plaquenil in therapeutic use  Primary osteoarthritis of both hands: She has PIP and DIP synovial thickening consistent with osteoarthritis of both hands.  She has right CMC joint synovial thickening.  Joint protection and muscle strengthening were discussed.   Trigger finger, right ring finger - Resolved   Osteopenia of multiple sites: results not in Epic  Other medical conditions are listed as follows:   Dyslipidemia  History of Clostridium difficile infection  Orders: Orders Placed This Encounter  Procedures  . CBC with diff  . CMP with GFR   Meds ordered this encounter  Medications  . methotrexate 50 MG/2ML injection    Sig: Inject 0.6 mLs (15 mg total) into the skin once a week. Inject 0.53m into the skin once weekly.    Dispense:  8 mL    Refill:  0  . folic acid (FOLVITE) 1 MG tablet    Sig: Take 2 tablets (2 mg total) by mouth daily.    Dispense:  180 tablet    Refill:  2  . Certolizumab Pegol KIT 400 mg    Face-to-face time spent with patient was 30 minutes. Greater than 50% of time was spent in counseling and coordination of care.  Follow-Up Instructions: Return in about 3 months (around 09/21/2019) for Rheumatoid arthritis,  Osteoarthritis.   SBo Merino MD   Scribed by-  THazel Sams PA-C  Note - This record has been created using Dragon software.  Chart creation errors have been sought, but may not always  have been located. Such creation errors do not reflect on  the standard of medical care.

## 2019-06-23 ENCOUNTER — Other Ambulatory Visit: Payer: Self-pay

## 2019-06-23 ENCOUNTER — Ambulatory Visit (INDEPENDENT_AMBULATORY_CARE_PROVIDER_SITE_OTHER): Payer: MEDICARE | Admitting: Rheumatology

## 2019-06-23 ENCOUNTER — Encounter: Payer: Self-pay | Admitting: Rheumatology

## 2019-06-23 VITALS — BP 136/81 | HR 68 | Resp 13 | Ht 63.0 in | Wt 142.0 lb

## 2019-06-23 DIAGNOSIS — Z79899 Other long term (current) drug therapy: Secondary | ICD-10-CM | POA: Diagnosis not present

## 2019-06-23 DIAGNOSIS — M19041 Primary osteoarthritis, right hand: Secondary | ICD-10-CM

## 2019-06-23 DIAGNOSIS — M65341 Trigger finger, right ring finger: Secondary | ICD-10-CM

## 2019-06-23 DIAGNOSIS — M0609 Rheumatoid arthritis without rheumatoid factor, multiple sites: Secondary | ICD-10-CM | POA: Diagnosis not present

## 2019-06-23 DIAGNOSIS — T372X5A Adverse effect of antimalarials and drugs acting on other blood protozoa, initial encounter: Secondary | ICD-10-CM

## 2019-06-23 DIAGNOSIS — H35389 Toxic maculopathy, unspecified eye: Secondary | ICD-10-CM

## 2019-06-23 DIAGNOSIS — E785 Hyperlipidemia, unspecified: Secondary | ICD-10-CM

## 2019-06-23 DIAGNOSIS — Z8619 Personal history of other infectious and parasitic diseases: Secondary | ICD-10-CM

## 2019-06-23 DIAGNOSIS — M8589 Other specified disorders of bone density and structure, multiple sites: Secondary | ICD-10-CM

## 2019-06-23 DIAGNOSIS — M19042 Primary osteoarthritis, left hand: Secondary | ICD-10-CM

## 2019-06-23 MED ORDER — CERTOLIZUMAB PEGOL 2 X 200 MG ~~LOC~~ KIT
400.0000 mg | PACK | Freq: Once | SUBCUTANEOUS | Status: AC
  Administered 2019-06-23: 400 mg via SUBCUTANEOUS

## 2019-06-23 MED ORDER — METHOTREXATE SODIUM CHEMO INJECTION 50 MG/2ML: 15.0000 mg | INTRAMUSCULAR | 0 refills | Status: DC

## 2019-06-23 MED ORDER — FOLIC ACID 1 MG PO TABS: 2.0000 mg | ORAL_TABLET | Freq: Every day | ORAL | 2 refills | Status: DC

## 2019-06-23 NOTE — Progress Notes (Signed)
Patient in office for Cimzia injection. Patient denies fever, infection or use of antibiotics. Patient given injection in right and left thigh. Patient tolerated injection well and will return in one month for next injection.   Administrations This Visit    Certolizumab Pegol KIT 400 mg    Admin Date 06/23/2019 Action Given Dose 400 mg Route Subcutaneous Administered By ,  L, LPN         

## 2019-06-23 NOTE — Patient Instructions (Signed)
Standing Labs We placed an order today for your standing lab work.    Please come back and get your standing labs today and every 3 months   We have open lab daily Monday through Thursday from 8:30-12:30 PM and 1:30-4:30 PM and Friday from 8:30-12:30 PM and 1:30-4:00 PM at the office of Dr. Pollyann Savoy.   You may experience shorter wait times on Monday and Friday afternoons. The office is located at [address], [address], [place], [place] [address] No appointment is necessary.   Labs are drawn by [facility].  You may receive a bill from [facility] for your lab work.  If you wish to have your labs drawn at another location, please call the office 24 hours in advance to send orders.  If you have any questions regarding directions or hours of operation,  please call [phone].   Just as a reminder please drink plenty of water prior to coming for your lab work. Thanks!

## 2019-07-14 ENCOUNTER — Telehealth: Payer: Self-pay | Admitting: *Deleted

## 2019-07-14 NOTE — Telephone Encounter (Signed)
Attempted to contact the patient and left message for patient to call the office.  

## 2019-07-21 ENCOUNTER — Ambulatory Visit (INDEPENDENT_AMBULATORY_CARE_PROVIDER_SITE_OTHER): Payer: MEDICARE

## 2019-07-21 ENCOUNTER — Telehealth: Payer: Self-pay

## 2019-07-21 ENCOUNTER — Other Ambulatory Visit: Payer: Self-pay

## 2019-07-21 VITALS — BP 133/75 | HR 76

## 2019-07-21 DIAGNOSIS — Z79899 Other long term (current) drug therapy: Secondary | ICD-10-CM

## 2019-07-21 DIAGNOSIS — M0609 Rheumatoid arthritis without rheumatoid factor, multiple sites: Secondary | ICD-10-CM | POA: Diagnosis not present

## 2019-07-21 MED ORDER — CERTOLIZUMAB PEGOL 2 X 200 MG ~~LOC~~ KIT
400.0000 mg | PACK | Freq: Once | SUBCUTANEOUS | Status: AC
  Administered 2019-07-21: 400 mg via SUBCUTANEOUS

## 2019-07-21 NOTE — Telephone Encounter (Signed)
Received labs via fax from [facility].   06/25/2019 CBC   MPV 8.9  Advised patient that the CMP was not drawn. I have re-faxed order to [phone], number provided by [facility]. Patient will go to [facility] within the next few days to have the CMP drawn.   Per [facility], the CMP was missed.

## 2019-07-21 NOTE — Progress Notes (Signed)
Patient presents in office for Cimzia injection. Patient denies fever, infection or use of antibiotics. Patient given injection in right and left thigh. Patient tolerated injection well and will return in one month for next injection. Patient had labs in January 2021 at [facility] in [place]. I have called to obtain results, they will be faxed over.   Administrations This Visit    Certolizumab Pegol KIT 400 mg    Admin Date 07/21/2019 Action Given Dose 400 mg Route Subcutaneous Administered By Earnestine Mealing, CMA

## 2019-07-23 ENCOUNTER — Telehealth: Payer: Self-pay | Admitting: *Deleted

## 2019-07-23 NOTE — Telephone Encounter (Signed)
-----   Message from Ellen Henri, CMA sent at 07/21/2019  4:40 PM EST ----- Patient had Cimzia injection in the office today. Please call patient to schedule next injection. Thanks!

## 2019-07-23 NOTE — Telephone Encounter (Signed)
Attempted to contact the patient and left message for patient to call the office.  

## 2019-07-27 ENCOUNTER — Telehealth: Payer: Self-pay | Admitting: *Deleted

## 2019-07-27 NOTE — Telephone Encounter (Signed)
Labs received from [facility] on 07/23/19   Reviewed by Sherron Ales, PA-C  Potassium 3.3, Chloride 97, Glucose 113, Creat. 0.5, MPV 8.4,   Patient is on MTX 0.6 mL weekly and in office Cimzia.  Patient advised of results and has talked to PCP about potassium.

## 2019-08-18 ENCOUNTER — Ambulatory Visit (INDEPENDENT_AMBULATORY_CARE_PROVIDER_SITE_OTHER): Payer: MEDICARE | Admitting: *Deleted

## 2019-08-18 ENCOUNTER — Other Ambulatory Visit: Payer: Self-pay

## 2019-08-18 VITALS — BP 151/69 | HR 78

## 2019-08-18 DIAGNOSIS — M0609 Rheumatoid arthritis without rheumatoid factor, multiple sites: Secondary | ICD-10-CM | POA: Diagnosis not present

## 2019-08-18 MED ORDER — CERTOLIZUMAB PEGOL 2 X 200 MG ~~LOC~~ KIT
400.0000 mg | PACK | Freq: Once | SUBCUTANEOUS | Status: AC
  Administered 2019-08-18: 400 mg via SUBCUTANEOUS

## 2019-08-18 NOTE — Progress Notes (Signed)
Patient in office for a Cimzia injection. Patient given injection in her left and right thigh. Patient tolerated injection well. Patient will return in one month for next injection. patient due for next labs in May.   Administrations This Visit    Certolizumab Pegol KIT 400 mg    Admin Date 08/18/2019 Action Given Dose 400 mg Route Subcutaneous Administered By Carole Binning, LPN

## 2019-09-16 NOTE — Progress Notes (Signed)
Office Visit Note  Patient: April Shepard             Date of Birth: 1940/06/24           MRN: [id number]             PCP: Artis Delay, MD Referring: Artis Delay, MD Visit Date: 09/23/2019 Occupation: '@GUAROCC'$ @  Subjective:  Medication monitoring   History of Present Illness: April Shepard is a 79 y.o. female with history of seronegative rheumatoid arthritis and osteoarthritis.  She has done very well on Cimzia and methotrexate combination.  She denies any joint pain or joint swelling.  She has no morning stiffness.  She has been tolerating medications well.  Activities of Daily Living:  Patient reports morning stiffness for 0 none.   Patient Denies nocturnal pain.  Difficulty dressing/grooming: Denies Difficulty climbing stairs: Denies Difficulty getting out of chair: Denies Difficulty using hands for taps, buttons, cutlery, and/or writing: Denies  Review of Systems  Constitutional: Negative for fatigue, night sweats, weight gain and weight loss.  HENT: Positive for mouth dryness. Negative for mouth sores, trouble swallowing, trouble swallowing and nose dryness.   Eyes: Negative for pain, redness, visual disturbance and dryness.  Respiratory: Negative for cough, shortness of breath and difficulty breathing.   Cardiovascular: Negative for chest pain, palpitations, hypertension, irregular heartbeat and swelling in legs/feet.  Gastrointestinal: Negative for blood in stool, constipation and diarrhea.  Endocrine: Negative for excessive thirst and increased urination.  Genitourinary: Negative for difficulty urinating and vaginal dryness.  Musculoskeletal: Negative for arthralgias, joint pain, joint swelling, myalgias, muscle weakness, morning stiffness, muscle tenderness and myalgias.  Skin: Negative for color change, rash, hair loss, skin tightness, ulcers and sensitivity to sunlight.  Allergic/Immunologic: Negative for susceptible to infections.    Neurological: Negative for dizziness, numbness, memory loss, night sweats and weakness.  Hematological: Negative for bruising/bleeding tendency and swollen glands.  Psychiatric/Behavioral: Negative for depressed mood and sleep disturbance. The patient is not nervous/anxious.     PMFS History:  Patient Active Problem List   Diagnosis Date Noted  . Dyslipidemia 07/23/2018  . 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 High risk medication use 10/22/2016  . Trigger finger, right ring finger 10/22/2016  . Osteopenia 05/13/2016  . Osteoarthritis of both hands 05/13/2016  . RA (rheumatoid arthritis) ([place]) 05/13/2016    Past Medical History:  Diagnosis Date  . Osteoarthritis of both hands 05/13/2016  . Osteopenia 05/13/2016  . RA (rheumatoid arthritis) ([place]) 05/13/2016    Family History  Problem Relation Age of Onset  . Breast cancer Mother   . Heart attack Father   . Melanoma Brother    Past Surgical History:  Procedure Laterality Date  . CHOLECYSTECTOMY     Social History   Social History Narrative  . Not on file   Immunization History  Administered Date(s) Administered  . Influenza, High Dose Seasonal PF 03/27/2018     Objective: Vital Signs: BP (!) 99/57 (BP Location: Left Arm, Patient Position: Sitting, Cuff Size: Normal)   Pulse 78   Resp 16   Ht '5\' 3"'$  (1.6 m)   Wt 136 lb 9.6 oz (62 kg)   BMI 24.20 kg/m    Physical Exam Vitals and nursing note reviewed.  Constitutional:      Appearance: She is well-developed.  HENT:     Head: Normocephalic and atraumatic.  Eyes:     Conjunctiva/sclera: Conjunctivae normal.  Cardiovascular:  Rate and Rhythm: Normal rate and regular rhythm.     Heart sounds: Normal heart sounds.  Pulmonary:     Effort: Pulmonary effort is normal.     Breath sounds: Normal breath sounds.  Abdominal:     General: Bowel sounds are normal.     Palpations: Abdomen is soft.  Musculoskeletal:     Cervical back:  Normal range of motion.  Lymphadenopathy:     Cervical: No cervical adenopathy.  Skin:    General: Skin is warm and dry.     Capillary Refill: Capillary refill takes less than 2 seconds.  Neurological:     Mental Status: She is alert and oriented to person, place, and time.  Psychiatric:        Behavior: Behavior normal.      Musculoskeletal Exam: C-spine thoracic and lumbar spine with good range of motion.  Shoulder joints, elbow joints, wrist joints, MCPs PIPs and DIPs with good range of motion.  She has some DIP and PIP thickening but no synovitis.  Hip joints, knee joints, ankles were in good range of motion.  She has no tenderness over MTPs.  CDAI Exam: CDAI Score: 0.2  Patient Global: 1 mm; Provider Global: 1 mm Swollen: 0 ; Tender: 0  Joint Exam 09/23/2019   No joint exam has been documented for this visit   There is currently no information documented on the homunculus. Go to the Rheumatology activity and complete the homunculus joint exam.  Investigation: No additional findings.  Imaging: No results found.  Recent Labs: Lab Results  Component Value Date   WBC 5.8 03/25/2019   HGB 13.8 03/25/2019   PLT 344 03/25/2019   NA 137 03/25/2019   K 4.0 03/25/2019   CL 99 03/25/2019   CO2 26 03/25/2019   GLUCOSE 77 03/25/2019   BUN 21 03/25/2019   CREATININE 0.68 03/25/2019   BILITOT 0.5 03/25/2019   ALKPHOS 59 05/14/2016   AST 27 03/25/2019   ALT 22 03/25/2019   PROT 7.0 03/25/2019   ALBUMIN 4.5 05/14/2016   CALCIUM 10.1 03/25/2019   GFRAA 97 03/25/2019   QFTBGOLDPLUS NEGATIVE 03/25/2019    Speciality Comments: no baseline biologic labs  Procedures:  No procedures performed Allergies: Patient has no known allergies.   Assessment / Plan:     Visit Diagnoses: Rheumatoid arthritis of multiple sites with negative rheumatoid factor ([place]) -patient had no synovitis on examination today.  She has been doing well on combination therapy.  I discussed decreasing  methotrexate dose but patient would prefer to continue current dosing regimen.  Plan: Certolizumab Pegol KIT  High risk medication use - Cimzia in office injection 400 mg every 28 days, methotrexate 0.31m every 7 days, and folic acid 1 mg 2 tablets daily - Plan: CBC with Differential/Platelet, COMPLETE METABOLIC PANEL WITH GFR, CBC with Differential/Platelet, COMPLETE METABOLIC PANEL WITH GFR today and every 3 months to monitor for drug toxicity.  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  Osteopenia of multiple sites - results not in Epic  History of Clostridium difficile infection  Dyslipidemia  Orders: Orders Placed This Encounter  Procedures  . CBC with Differential/Platelet  . COMPLETE METABOLIC PANEL WITH GFR  . CBC with Differential/Platelet  . COMPLETE METABOLIC PANEL WITH GFR   Meds ordered this encounter  Medications  . Certolizumab Pegol KIT     Follow-Up Instructions: Return in about 5 months (around 02/23/2020) for Rheumatoid arthritis.  Bo Merino, MD  Note - This record has been created using Editor, commissioning.  Chart creation errors have been sought, but may not always  have been located. Such creation errors do not reflect on  the standard of medical care.

## 2019-09-22 ENCOUNTER — Other Ambulatory Visit: Payer: Self-pay | Admitting: Rheumatology

## 2019-09-22 NOTE — Telephone Encounter (Signed)
Last Visit: 06/23/19 Next visit: 09/23/19 Labs: 07/23/19 Potassium 3.3, Chloride 97, Glucose 113, Creat. 0.5, MPV 8.4  Okay to refill per Dr. Corliss Skains

## 2019-09-23 ENCOUNTER — Encounter: Payer: Self-pay | Admitting: Rheumatology

## 2019-09-23 ENCOUNTER — Ambulatory Visit (INDEPENDENT_AMBULATORY_CARE_PROVIDER_SITE_OTHER): Payer: MEDICARE | Admitting: Rheumatology

## 2019-09-23 ENCOUNTER — Other Ambulatory Visit: Payer: Self-pay

## 2019-09-23 VITALS — BP 99/57 | HR 78 | Resp 16 | Ht 63.0 in | Wt 136.6 lb

## 2019-09-23 DIAGNOSIS — T372X5A Adverse effect of antimalarials and drugs acting on other blood protozoa, initial encounter: Secondary | ICD-10-CM

## 2019-09-23 DIAGNOSIS — M8589 Other specified disorders of bone density and structure, multiple sites: Secondary | ICD-10-CM

## 2019-09-23 DIAGNOSIS — M19041 Primary osteoarthritis, right hand: Secondary | ICD-10-CM

## 2019-09-23 DIAGNOSIS — Z8619 Personal history of other infectious and parasitic diseases: Secondary | ICD-10-CM

## 2019-09-23 DIAGNOSIS — Z79899 Other long term (current) drug therapy: Secondary | ICD-10-CM

## 2019-09-23 DIAGNOSIS — M19042 Primary osteoarthritis, left hand: Secondary | ICD-10-CM

## 2019-09-23 DIAGNOSIS — H35389 Toxic maculopathy, unspecified eye: Secondary | ICD-10-CM

## 2019-09-23 DIAGNOSIS — M0609 Rheumatoid arthritis without rheumatoid factor, multiple sites: Secondary | ICD-10-CM

## 2019-09-23 DIAGNOSIS — E785 Hyperlipidemia, unspecified: Secondary | ICD-10-CM

## 2019-09-23 LAB — CBC WITH DIFFERENTIAL/PLATELET
Absolute Monocytes: 598 cells/uL (ref 200–950)
Basophils Absolute: 66 cells/uL (ref 0–200)
Basophils Relative: 0.8 %
Eosinophils Absolute: 83 cells/uL (ref 15–500)
Eosinophils Relative: 1 %
HCT: 39.4 % (ref 35.0–45.0)
Hemoglobin: 13.5 g/dL (ref 11.7–15.5)
Lymphs Abs: 1619 cells/uL (ref 850–3900)
MCH: 32.8 pg (ref 27.0–33.0)
MCHC: 34.3 g/dL (ref 32.0–36.0)
MCV: 95.9 fL (ref 80.0–100.0)
MPV: 8.6 fL (ref 7.5–12.5)
Monocytes Relative: 7.2 %
Neutro Abs: 5935 cells/uL (ref 1500–7800)
Neutrophils Relative %: 71.5 %
Platelets: 302 10*3/uL (ref 140–400)
RBC: 4.11 10*6/uL (ref 3.80–5.10)
RDW: 12.9 % (ref 11.0–15.0)
Total Lymphocyte: 19.5 %
WBC: 8.3 10*3/uL (ref 3.8–10.8)

## 2019-09-23 LAB — COMPLETE METABOLIC PANEL WITH GFR
AG Ratio: 2 (calc) (ref 1.0–2.5)
ALT: 21 U/L (ref 6–29)
AST: 24 U/L (ref 10–35)
Albumin: 4.3 g/dL (ref 3.6–5.1)
Alkaline phosphatase (APISO): 82 U/L (ref 37–153)
BUN: 13 mg/dL (ref 7–25)
CO2: 29 mmol/L (ref 20–32)
Calcium: 9.9 mg/dL (ref 8.6–10.4)
Chloride: 98 mmol/L (ref 98–110)
Creat: 0.76 mg/dL (ref 0.60–0.93)
GFR, Est African American: 86 mL/min/{1.73_m2} (ref >=60)
GFR, Est Non African American: 75 mL/min/{1.73_m2} (ref >=60)
Globulin: 2.2 g/dL (calc) (ref 1.9–3.7)
Glucose, Bld: 90 mg/dL (ref 65–99)
Potassium: 3.5 mmol/L (ref 3.5–5.3)
Sodium: 136 mmol/L (ref 135–146)
Total Bilirubin: 0.6 mg/dL (ref 0.2–1.2)
Total Protein: 6.5 g/dL (ref 6.1–8.1)

## 2019-09-23 MED ORDER — CERTOLIZUMAB PEGOL 2 X 200 MG ~~LOC~~ KIT
PACK | Freq: Once | SUBCUTANEOUS | Status: AC
  Administered 2019-09-23: 400 mg via SUBCUTANEOUS

## 2019-09-23 NOTE — Patient Instructions (Signed)
Standing Labs We placed an order today for your standing lab work.    Please come back and get your standing labs in July and every 3 months   We have open lab daily Monday through Thursday from 8:30-12:30 PM and 1:30-4:30 PM and Friday from 8:30-12:30 PM and 1:30-4:00 PM at the office of Dr. Monalisa Bayless.   You may experience shorter wait times on Monday and Friday afternoons. The office is located at [address], [address], [place], [place] [address] No appointment is necessary.   Labs are drawn by [facility].  You may receive a bill from [facility] for your lab work.  If you wish to have your labs drawn at another location, please call the office 24 hours in advance to send orders.  If you have any questions regarding directions or hours of operation,  please call [phone].   Just as a reminder please drink plenty of water prior to coming for your lab work. Thanks!   

## 2019-09-23 NOTE — Progress Notes (Signed)
Pharmacy Note  Subjective:    Patient presents for follow-up visit with Dr. Deveshwar and monthly Cimzia injection.  Patient denies current infection.  Patient denies being on antibiotics.  Patient denies any upcoming invasive procedures.  Last Cimzia injection was on 08/18/2019.  Most recent CBC/CMP stable at [facility] on 2/5/202.  Objective: CMP     Component Value Date/Time   NA 137 03/25/2019 1044   NA 138 02/20/2016 0000   K 4.0 03/25/2019 1044   CL 99 03/25/2019 1044   CO2 26 03/25/2019 1044   GLUCOSE 77 03/25/2019 1044   BUN 21 03/25/2019 1044   BUN 13 02/20/2016 0000   CREATININE 0.68 03/25/2019 1044   CALCIUM 10.1 03/25/2019 1044   PROT 7.0 03/25/2019 1044   ALBUMIN 4.5 05/14/2016 1314   AST 27 03/25/2019 1044   ALT 22 03/25/2019 1044   ALKPHOS 59 05/14/2016 1314   BILITOT 0.5 03/25/2019 1044   GFRNONAA 84 03/25/2019 1044   GFRAA 97 03/25/2019 1044    CBC    Component Value Date/Time   WBC 5.8 03/25/2019 1044   RBC 4.23 03/25/2019 1044   HGB 13.8 03/25/2019 1044   HCT 40.2 03/25/2019 1044   PLT 344 03/25/2019 1044   MCV 95.0 03/25/2019 1044   MCH 32.6 03/25/2019 1044   MCHC 34.3 03/25/2019 1044   RDW 13.0 03/25/2019 1044   LYMPHSABS 1,734 03/25/2019 1044   MONOABS 434 05/14/2016 1314   EOSABS 110 03/25/2019 1044   BASOSABS 70 03/25/2019 1044    Baseline Immunosuppressant Therapy Labs TB GOLD Quantiferon TB Gold Latest Ref Rng & Units 03/25/2019  Quantiferon TB Gold Plus NEGATIVE NEGATIVE   Hepatitis Panel Hepatitis Latest Ref Rng & Units 03/05/2018  Hep B Surface Ag NON-REACTI NON-REACTIVE  Hep B IgM NON-REACTI NON-REACTIVE  Hep C Ab NON-REACTI NON-REACTIVE  Hep C Ab NON-REACTI NON-REACTIVE   HIV Lab Results  Component Value Date   HIV NON-REACTIVE 03/05/2018   Immunoglobulins Immunoglobulin Electrophoresis Latest Ref Rng & Units 03/05/2018  IgA  20 - 320 mg/dL 119  IgG 600 - 1,540 mg/dL 844  IgM 50 - 300 mg/dL 156   SPEP Serum Protein  Electrophoresis Latest Ref Rng & Units 03/25/2019  Total Protein 6.1 - 8.1 g/dL 7.0  Albumin 3.8 - 4.8 g/dL -  Alpha-1 0.2 - 0.3 g/dL -  Alpha-2 0.5 - 0.9 g/dL -  Beta Globulin 0.4 - 0.6 g/dL -  Beta 2 0.2 - 0.5 g/dL -  Gamma Globulin 0.8 - 1.7 g/dL -   G6PD Lab Results  Component Value Date   G6PDH 17.9 11/19/2017   TPMT No results found for: TPMT   Chest x-ray: normal 10/18/2005   Assessment/Plan:   Administered injection in left anterior thigh.  Patient tolerated well.  Administrations This Visit    Certolizumab Pegol KIT    Admin Date 09/23/2019 Action Given Dose 400 mg Route Subcutaneous Administered By ,  C, RPH-CPP         Scheduled for next injection on 5/6 at 10:30 AM.   All questions encouraged and answered.  Instructed patient to call with any further questions or concerns.   , PharmD, BCACP Rheumatology Clinical Pharmacist  09/23/2019 11:08 AM     

## 2019-10-19 NOTE — Progress Notes (Deleted)
Pharmacy Note  Subjective:   Patient presents to clinic today to receive monthly injection of Cimzia.  Patient running a fever or have signs/symptoms of infection? {yes/no:20286}  Patient currently on antibiotics for the treatment of infection? {yes/no:20286}  Patient have any upcoming invasive procedures/surgeries? {yes/no:20286}  Objective: CMP     Component Value Date/Time   NA 136 09/23/2019 1205   NA 138 02/20/2016 0000   K 3.5 09/23/2019 1205   CL 98 09/23/2019 1205   CO2 29 09/23/2019 1205   GLUCOSE 90 09/23/2019 1205   BUN 13 09/23/2019 1205   BUN 13 02/20/2016 0000   CREATININE 0.76 09/23/2019 1205   CALCIUM 9.9 09/23/2019 1205   PROT 6.5 09/23/2019 1205   ALBUMIN 4.5 05/14/2016 1314   AST 24 09/23/2019 1205   ALT 21 09/23/2019 1205   ALKPHOS 59 05/14/2016 1314   BILITOT 0.6 09/23/2019 1205   GFRNONAA 75 09/23/2019 1205   GFRAA 86 09/23/2019 1205    CBC    Component Value Date/Time   WBC 8.3 09/23/2019 1205   RBC 4.11 09/23/2019 1205   HGB 13.5 09/23/2019 1205   HCT 39.4 09/23/2019 1205   PLT 302 09/23/2019 1205   MCV 95.9 09/23/2019 1205   MCH 32.8 09/23/2019 1205   MCHC 34.3 09/23/2019 1205   RDW 12.9 09/23/2019 1205   LYMPHSABS 1,619 09/23/2019 1205   MONOABS 434 05/14/2016 1314   EOSABS 83 09/23/2019 1205   BASOSABS 66 09/23/2019 1205    Baseline Immunosuppressant Therapy Labs TB GOLD Quantiferon TB Gold Latest Ref Rng & Units 03/25/2019  Quantiferon TB Gold Plus NEGATIVE NEGATIVE   Hepatitis Panel Hepatitis Latest Ref Rng & Units 03/05/2018  Hep B Surface Ag NON-REACTI NON-REACTIVE  Hep B IgM NON-REACTI NON-REACTIVE  Hep C Ab NON-REACTI NON-REACTIVE  Hep C Ab NON-REACTI NON-REACTIVE   HIV Lab Results  Component Value Date   HIV NON-REACTIVE 03/05/2018   Immunoglobulins Immunoglobulin Electrophoresis Latest Ref Rng & Units 03/05/2018  IgA  20 - 320 mg/dL 267  IgG 124 - 5,809 mg/dL 983  IgM 50 - 382 mg/dL 505   SPEP Serum Protein  Electrophoresis Latest Ref Rng & Units 09/23/2019  Total Protein 6.1 - 8.1 g/dL 6.5  Albumin 3.8 - 4.8 g/dL -  Alpha-1 0.2 - 0.3 g/dL -  Alpha-2 0.5 - 0.9 g/dL -  Beta Globulin 0.4 - 0.6 g/dL -  Beta 2 0.2 - 0.5 g/dL -  Gamma Globulin 0.8 - 1.7 g/dL -   [id number] Lab Results  Component Value Date   G6PDH 17.9 11/19/2017   TPMT No results found for: TPMT   Chest x-ray: normal chest 10/18/2005  Assessment/Plan:    Patient tolerated well.    Patient is to return in July for labs.  Standing orders placed.  All questions encouraged and answered.  Instructed patient to call with any further questions or concerns.

## 2019-10-20 ENCOUNTER — Telehealth: Payer: Self-pay | Admitting: Rheumatology

## 2019-10-20 NOTE — Telephone Encounter (Signed)
Her appointment was scheduled at her last visit.  We can squeeze her in next Wednesday at 11 as I am not here Monday and Tuesday is fully booked. Thanks.   Verlin Fester, PharmD, Home, CPP Clinical Specialty Pharmacist [phone]  10/20/2019 10:21 AM

## 2019-10-20 NOTE — Telephone Encounter (Signed)
I called patient to prescreen for her appointment tomorrow. Patient stated since she did not hear anything 4 days or so earlier, she assumed appointment was going to be canceled. Patient asked to put off appointment for a day or two since she has another appointment at the same time with another doctor. Please call to reschedule appt.

## 2019-10-20 NOTE — Telephone Encounter (Signed)
Patient was rescheduled for Wednesday 10/27/2019.

## 2019-10-21 ENCOUNTER — Ambulatory Visit: Payer: MEDICARE

## 2019-10-27 ENCOUNTER — Other Ambulatory Visit: Payer: Self-pay

## 2019-10-27 ENCOUNTER — Ambulatory Visit (INDEPENDENT_AMBULATORY_CARE_PROVIDER_SITE_OTHER): Payer: MEDICARE | Admitting: Pharmacist

## 2019-10-27 VITALS — BP 124/68 | HR 69

## 2019-10-27 DIAGNOSIS — M0609 Rheumatoid arthritis without rheumatoid factor, multiple sites: Secondary | ICD-10-CM | POA: Diagnosis not present

## 2019-10-27 MED ORDER — CERTOLIZUMAB PEGOL 2 X 200 MG ~~LOC~~ KIT
400.0000 mg | PACK | Freq: Once | SUBCUTANEOUS | Status: AC
  Administered 2019-10-27: 400 mg via SUBCUTANEOUS

## 2019-10-27 NOTE — Progress Notes (Signed)
Pharmacy Note  Subjective:   Patient presents to clinic today to receive monthly dose of Cimzia.  Patient running a fever or have signs/symptoms of infection? No  Patient currently on antibiotics for the treatment of infection? No  Patient have any upcoming invasive procedures/surgeries? No  Objective: CMP     Component Value Date/Time   NA 136 09/23/2019 1205   NA 138 02/20/2016 0000   K 3.5 09/23/2019 1205   CL 98 09/23/2019 1205   CO2 29 09/23/2019 1205   GLUCOSE 90 09/23/2019 1205   BUN 13 09/23/2019 1205   BUN 13 02/20/2016 0000   CREATININE 0.76 09/23/2019 1205   CALCIUM 9.9 09/23/2019 1205   PROT 6.5 09/23/2019 1205   ALBUMIN 4.5 05/14/2016 1314   AST 24 09/23/2019 1205   ALT 21 09/23/2019 1205   ALKPHOS 59 05/14/2016 1314   BILITOT 0.6 09/23/2019 1205   GFRNONAA 75 09/23/2019 1205   GFRAA 86 09/23/2019 1205    CBC    Component Value Date/Time   WBC 8.3 09/23/2019 1205   RBC 4.11 09/23/2019 1205   HGB 13.5 09/23/2019 1205   HCT 39.4 09/23/2019 1205   PLT 302 09/23/2019 1205   MCV 95.9 09/23/2019 1205   MCH 32.8 09/23/2019 1205   MCHC 34.3 09/23/2019 1205   RDW 12.9 09/23/2019 1205   LYMPHSABS 1,619 09/23/2019 1205   MONOABS 434 05/14/2016 1314   EOSABS 83 09/23/2019 1205   BASOSABS 66 09/23/2019 1205    Baseline Immunosuppressant Therapy Labs TB GOLD Quantiferon TB Gold Latest Ref Rng & Units 03/25/2019  Quantiferon TB Gold Plus NEGATIVE NEGATIVE   Hepatitis Panel Hepatitis Latest Ref Rng & Units 03/05/2018  Hep B Surface Ag NON-REACTI NON-REACTIVE  Hep B IgM NON-REACTI NON-REACTIVE  Hep C Ab NON-REACTI NON-REACTIVE  Hep C Ab NON-REACTI NON-REACTIVE   HIV Lab Results  Component Value Date   HIV NON-REACTIVE 03/05/2018   Immunoglobulins Immunoglobulin Electrophoresis Latest Ref Rng & Units 03/05/2018  IgA  20 - 320 mg/dL 119  IgG 600 - 1,540 mg/dL 844  IgM 50 - 300 mg/dL 156   SPEP Serum Protein Electrophoresis Latest Ref Rng & Units  09/23/2019  Total Protein 6.1 - 8.1 g/dL 6.5  Albumin 3.8 - 4.8 g/dL -  Alpha-1 0.2 - 0.3 g/dL -  Alpha-2 0.5 - 0.9 g/dL -  Beta Globulin 0.4 - 0.6 g/dL -  Beta 2 0.2 - 0.5 g/dL -  Gamma Globulin 0.8 - 1.7 g/dL -   G6PD Lab Results  Component Value Date   G6PDH 17.9 11/19/2017   TPMT No results found for: TPMT   Chest x-ray: normal chest x-ray 10/18/2005  Assessment/Plan:   Administrations This Visit    Certolizumab Pegol KIT 400 mg    Admin Date 10/27/2019 Action Given Dose 400 mg Route Subcutaneous Administered By Mariella Saa C, RPH-CPP          Patient tolerated well without adverse reaction  Next injection scheduled for 6/9 at 10:30AM.  She will be due for repeat CBC/CMP in July.  She will be due for repeat TB gold in October.  All questions encouraged and answered.  Instructed patient to call with any further questions or concerns.  Mariella Saa, PharmD, [place], [place] Clinical Specialty Pharmacist [phone]  10/27/2019 11:40 AM

## 2019-11-17 NOTE — Progress Notes (Signed)
Pharmacy Note  Subjective:   Patient presents to clinic today to receive monthly dose of Cimzia.  Patient running a fever or have signs/symptoms of infection? No  Patient currently on antibiotics for the treatment of infection? No  Patient have any upcoming invasive procedures/surgeries? No  Objective: CMP     Component Value Date/Time   NA 136 09/23/2019 1205   NA 138 02/20/2016 0000   K 3.5 09/23/2019 1205   CL 98 09/23/2019 1205   CO2 29 09/23/2019 1205   GLUCOSE 90 09/23/2019 1205   BUN 13 09/23/2019 1205   BUN 13 02/20/2016 0000   CREATININE 0.76 09/23/2019 1205   CALCIUM 9.9 09/23/2019 1205   PROT 6.5 09/23/2019 1205   ALBUMIN 4.5 05/14/2016 1314   AST 24 09/23/2019 1205   ALT 21 09/23/2019 1205   ALKPHOS 59 05/14/2016 1314   BILITOT 0.6 09/23/2019 1205   GFRNONAA 75 09/23/2019 1205   GFRAA 86 09/23/2019 1205    CBC    Component Value Date/Time   WBC 8.3 09/23/2019 1205   RBC 4.11 09/23/2019 1205   HGB 13.5 09/23/2019 1205   HCT 39.4 09/23/2019 1205   PLT 302 09/23/2019 1205   MCV 95.9 09/23/2019 1205   MCH 32.8 09/23/2019 1205   MCHC 34.3 09/23/2019 1205   RDW 12.9 09/23/2019 1205   LYMPHSABS 1,619 09/23/2019 1205   MONOABS 434 05/14/2016 1314   EOSABS 83 09/23/2019 1205   BASOSABS 66 09/23/2019 1205    Baseline Immunosuppressant Therapy Labs TB GOLD Quantiferon TB Gold Latest Ref Rng & Units 03/25/2019  Quantiferon TB Gold Plus NEGATIVE NEGATIVE   Hepatitis Panel Hepatitis Latest Ref Rng & Units 03/05/2018  Hep B Surface Ag NON-REACTI NON-REACTIVE  Hep B IgM NON-REACTI NON-REACTIVE  Hep C Ab NON-REACTI NON-REACTIVE  Hep C Ab NON-REACTI NON-REACTIVE   HIV Lab Results  Component Value Date   HIV NON-REACTIVE 03/05/2018   Immunoglobulins Immunoglobulin Electrophoresis Latest Ref Rng & Units 03/05/2018  IgA  20 - 320 mg/dL 119  IgG 600 - 1,540 mg/dL 844  IgM 50 - 300 mg/dL 156   SPEP Serum Protein Electrophoresis Latest Ref Rng & Units  09/23/2019  Total Protein 6.1 - 8.1 g/dL 6.5  Albumin 3.8 - 4.8 g/dL -  Alpha-1 0.2 - 0.3 g/dL -  Alpha-2 0.5 - 0.9 g/dL -  Beta Globulin 0.4 - 0.6 g/dL -  Beta 2 0.2 - 0.5 g/dL -  Gamma Globulin 0.8 - 1.7 g/dL -   G6PD Lab Results  Component Value Date   G6PDH 17.9 11/19/2017   TPMT No results found for: TPMT   Chest x-ray: normal 10/18/2005  Assessment/Plan:   Administrations This Visit    Certolizumab Pegol KIT 400 mg    Admin Date 11/24/2019 Action Given Dose 400 mg Route Subcutaneous Administered By Mariella Saa C, RPH-CPP         Patient tolerated injection well without issue.   Appointment for next injection scheduled for 12/22/19 @ 10:30AM.  Patient due for labs in July.  Patient is to call and reschedule appointment if running a fever with signs/symptoms of infection, on antibiotics for active infection or has an upcoming invasive procedure.  All questions encouraged and answered.  Instructed patient to call with any further questions or concerns.  Mariella Saa, PharmD, [place], CPP Clinical Specialty Pharmacist (Rheumatology and Pulmonology)  11/24/2019 11:20 AM

## 2019-11-24 ENCOUNTER — Other Ambulatory Visit: Payer: Self-pay

## 2019-11-24 ENCOUNTER — Ambulatory Visit (INDEPENDENT_AMBULATORY_CARE_PROVIDER_SITE_OTHER): Payer: MEDICARE | Admitting: Pharmacist

## 2019-11-24 VITALS — BP 128/77 | HR 70

## 2019-11-24 DIAGNOSIS — M0609 Rheumatoid arthritis without rheumatoid factor, multiple sites: Secondary | ICD-10-CM

## 2019-11-24 MED ORDER — CERTOLIZUMAB PEGOL 2 X 200 MG ~~LOC~~ KIT
400.0000 mg | PACK | Freq: Once | SUBCUTANEOUS | Status: AC
  Administered 2019-11-24: 400 mg via SUBCUTANEOUS

## 2019-12-08 ENCOUNTER — Other Ambulatory Visit: Payer: Self-pay | Admitting: Rheumatology

## 2019-12-08 NOTE — Telephone Encounter (Signed)
Last Visit: 09/23/2019  Next Visit: 12/22/2019 Labs: 09/23/2019 CBC and CMP WNL  Current Dose per office note 09/23/2019: methotrexate 0.41mL every 7 days, DX:  Rheumatoid arthritis   Okay to refill per Dr. Corliss Skains

## 2019-12-22 ENCOUNTER — Other Ambulatory Visit: Payer: Self-pay

## 2019-12-22 ENCOUNTER — Ambulatory Visit (INDEPENDENT_AMBULATORY_CARE_PROVIDER_SITE_OTHER): Payer: MEDICARE | Admitting: Pharmacist

## 2019-12-22 VITALS — BP 126/70 | HR 76

## 2019-12-22 DIAGNOSIS — M0609 Rheumatoid arthritis without rheumatoid factor, multiple sites: Secondary | ICD-10-CM

## 2019-12-22 DIAGNOSIS — Z79899 Other long term (current) drug therapy: Secondary | ICD-10-CM

## 2019-12-22 LAB — CBC WITH DIFFERENTIAL/PLATELET
Absolute Monocytes: 462 cells/uL (ref 200–950)
Basophils Absolute: 63 cells/uL (ref 0–200)
Basophils Relative: 1.1 %
Eosinophils Absolute: 103 cells/uL (ref 15–500)
Eosinophils Relative: 1.8 %
HCT: 41.4 % (ref 35.0–45.0)
Hemoglobin: 14.1 g/dL (ref 11.7–15.5)
Lymphs Abs: 1687 cells/uL (ref 850–3900)
MCH: 32.4 pg (ref 27.0–33.0)
MCHC: 34.1 g/dL (ref 32.0–36.0)
MCV: 95.2 fL (ref 80.0–100.0)
MPV: 8.7 fL (ref 7.5–12.5)
Monocytes Relative: 8.1 %
Neutro Abs: 3386 cells/uL (ref 1500–7800)
Neutrophils Relative %: 59.4 %
Platelets: 262 10*3/uL (ref 140–400)
RBC: 4.35 10*6/uL (ref 3.80–5.10)
RDW: 12.4 % (ref 11.0–15.0)
Total Lymphocyte: 29.6 %
WBC: 5.7 10*3/uL (ref 3.8–10.8)

## 2019-12-22 LAB — COMPLETE METABOLIC PANEL WITH GFR
AG Ratio: 2.1 (calc) (ref 1.0–2.5)
ALT: 25 U/L (ref 6–29)
AST: 28 U/L (ref 10–35)
Albumin: 4.6 g/dL (ref 3.6–5.1)
Alkaline phosphatase (APISO): 95 U/L (ref 37–153)
BUN: 15 mg/dL (ref 7–25)
CO2: 33 mmol/L — ABNORMAL HIGH (ref 20–32)
Calcium: 10.1 mg/dL (ref 8.6–10.4)
Chloride: 97 mmol/L — ABNORMAL LOW (ref 98–110)
Creat: 0.68 mg/dL (ref 0.60–0.93)
GFR, Est African American: 96 mL/min/{1.73_m2} (ref >=60)
GFR, Est Non African American: 83 mL/min/{1.73_m2} (ref >=60)
Globulin: 2.2 g/dL (calc) (ref 1.9–3.7)
Glucose, Bld: 89 mg/dL (ref 65–99)
Potassium: 3.8 mmol/L (ref 3.5–5.3)
Sodium: 138 mmol/L (ref 135–146)
Total Bilirubin: 0.6 mg/dL (ref 0.2–1.2)
Total Protein: 6.8 g/dL (ref 6.1–8.1)

## 2019-12-22 MED ORDER — CERTOLIZUMAB PEGOL 2 X 200 MG ~~LOC~~ KIT
400.0000 mg | PACK | Freq: Once | SUBCUTANEOUS | Status: AC
  Administered 2019-12-22: 400 mg via SUBCUTANEOUS

## 2019-12-22 NOTE — Progress Notes (Signed)
Pharmacy Note  Subjective:   Patient presents to clinic today to receive monthly dose of Cimzia.  Patient running a fever or have signs/symptoms of infection? No  Patient currently on antibiotics for the treatment of infection? No  Patient have any upcoming invasive procedures/surgeries? No  Objective: CMP     Component Value Date/Time   NA 136 09/23/2019 1205   NA 138 02/20/2016 0000   K 3.5 09/23/2019 1205   CL 98 09/23/2019 1205   CO2 29 09/23/2019 1205   GLUCOSE 90 09/23/2019 1205   BUN 13 09/23/2019 1205   BUN 13 02/20/2016 0000   CREATININE 0.76 09/23/2019 1205   CALCIUM 9.9 09/23/2019 1205   PROT 6.5 09/23/2019 1205   ALBUMIN 4.5 05/14/2016 1314   AST 24 09/23/2019 1205   ALT 21 09/23/2019 1205   ALKPHOS 59 05/14/2016 1314   BILITOT 0.6 09/23/2019 1205   GFRNONAA 75 09/23/2019 1205   GFRAA 86 09/23/2019 1205    CBC    Component Value Date/Time   WBC 8.3 09/23/2019 1205   RBC 4.11 09/23/2019 1205   HGB 13.5 09/23/2019 1205   HCT 39.4 09/23/2019 1205   PLT 302 09/23/2019 1205   MCV 95.9 09/23/2019 1205   MCH 32.8 09/23/2019 1205   MCHC 34.3 09/23/2019 1205   RDW 12.9 09/23/2019 1205   LYMPHSABS 1,619 09/23/2019 1205   MONOABS 434 05/14/2016 1314   EOSABS 83 09/23/2019 1205   BASOSABS 66 09/23/2019 1205    Baseline Immunosuppressant Therapy Labs TB GOLD Quantiferon TB Gold Latest Ref Rng & Units 03/25/2019  Quantiferon TB Gold Plus NEGATIVE NEGATIVE   Hepatitis Panel Hepatitis Latest Ref Rng & Units 03/05/2018  Hep B Surface Ag NON-REACTI NON-REACTIVE  Hep B IgM NON-REACTI NON-REACTIVE  Hep C Ab NON-REACTI NON-REACTIVE  Hep C Ab NON-REACTI NON-REACTIVE   HIV Lab Results  Component Value Date   HIV NON-REACTIVE 03/05/2018   Immunoglobulins Immunoglobulin Electrophoresis Latest Ref Rng & Units 03/05/2018  IgA  20 - 320 mg/dL 119  IgG 600 - 1,540 mg/dL 844  IgM 50 - 300 mg/dL 156   SPEP Serum Protein Electrophoresis Latest Ref Rng & Units  09/23/2019  Total Protein 6.1 - 8.1 g/dL 6.5  Albumin 3.8 - 4.8 g/dL -  Alpha-1 0.2 - 0.3 g/dL -  Alpha-2 0.5 - 0.9 g/dL -  Beta Globulin 0.4 - 0.6 g/dL -  Beta 2 0.2 - 0.5 g/dL -  Gamma Globulin 0.8 - 1.7 g/dL -   G6PD Lab Results  Component Value Date   G6PDH 17.9 11/19/2017   TPMT No results found for: TPMT   Chest x-ray: normal chest 10/18/2005  Assessment/Plan:   Administrations This Visit    Certolizumab Pegol KIT 400 mg    Admin Date 12/22/2019 Action Given Dose 400 mg Route Subcutaneous Administered By Mariella Saa C, RPH-CPP         Patient tolerated injection  Without issue.   Appointment for next injection scheduled for 8/4 @ 10:30AM.  Patient due for labs today then in 3 months.  She will be due for TB gold in October. Patient is to call and reschedule appointment if running a fever with signs/symptoms of infection, on antibiotics for active infection or has an upcoming invasive procedure.  All questions encouraged and answered.  Instructed patient to call with any further questions or concerns.  Mariella Saa, PharmD, [place], CPP Clinical Specialty Pharmacist (Rheumatology and Pulmonology)  12/22/2019 11:38 AM

## 2019-12-23 NOTE — Progress Notes (Signed)
CBC/CMP within normal limits.  She should continue current medication regimen of Cimzia and methotrexate.

## 2020-01-19 ENCOUNTER — Other Ambulatory Visit: Payer: Self-pay

## 2020-01-19 ENCOUNTER — Ambulatory Visit (INDEPENDENT_AMBULATORY_CARE_PROVIDER_SITE_OTHER): Payer: MEDICARE | Admitting: Pharmacist

## 2020-01-19 VITALS — BP 98/55 | HR 76

## 2020-01-19 DIAGNOSIS — M0609 Rheumatoid arthritis without rheumatoid factor, multiple sites: Secondary | ICD-10-CM | POA: Diagnosis not present

## 2020-01-19 MED ORDER — CERTOLIZUMAB PEGOL 2 X 200 MG ~~LOC~~ KIT
400.0000 mg | PACK | Freq: Once | SUBCUTANEOUS | Status: AC
  Administered 2020-01-19: 400 mg via SUBCUTANEOUS

## 2020-01-19 NOTE — Progress Notes (Signed)
Pharmacy Note  Subjective:   Patient presents to clinic today to receive monthly dose of Cimzia.  Patient running a fever or have signs/symptoms of infection? No  Patient currently on antibiotics for the treatment of infection? No  Patient have any upcoming invasive procedures/surgeries? No  Objective: CMP     Component Value Date/Time   NA 138 12/22/2019 1112   NA 138 02/20/2016 0000   K 3.8 12/22/2019 1112   CL 97 (L) 12/22/2019 1112   CO2 33 (H) 12/22/2019 1112   GLUCOSE 89 12/22/2019 1112   BUN 15 12/22/2019 1112   BUN 13 02/20/2016 0000   CREATININE 0.68 12/22/2019 1112   CALCIUM 10.1 12/22/2019 1112   PROT 6.8 12/22/2019 1112   ALBUMIN 4.5 05/14/2016 1314   AST 28 12/22/2019 1112   ALT 25 12/22/2019 1112   ALKPHOS 59 05/14/2016 1314   BILITOT 0.6 12/22/2019 1112   GFRNONAA 83 12/22/2019 1112   GFRAA 96 12/22/2019 1112    CBC    Component Value Date/Time   WBC 5.7 12/22/2019 1112   RBC 4.35 12/22/2019 1112   HGB 14.1 12/22/2019 1112   HCT 41.4 12/22/2019 1112   PLT 262 12/22/2019 1112   MCV 95.2 12/22/2019 1112   MCH 32.4 12/22/2019 1112   MCHC 34.1 12/22/2019 1112   RDW 12.4 12/22/2019 1112   LYMPHSABS 1,687 12/22/2019 1112   MONOABS 434 05/14/2016 1314   EOSABS 103 12/22/2019 1112   BASOSABS 63 12/22/2019 1112    Baseline Immunosuppressant Therapy Labs TB GOLD Quantiferon TB Gold Latest Ref Rng & Units 03/25/2019  Quantiferon TB Gold Plus NEGATIVE NEGATIVE   Hepatitis Panel Hepatitis Latest Ref Rng & Units 03/05/2018  Hep B Surface Ag NON-REACTI NON-REACTIVE  Hep B IgM NON-REACTI NON-REACTIVE  Hep C Ab NON-REACTI NON-REACTIVE  Hep C Ab NON-REACTI NON-REACTIVE   HIV Lab Results  Component Value Date   HIV NON-REACTIVE 03/05/2018   Immunoglobulins Immunoglobulin Electrophoresis Latest Ref Rng & Units 03/05/2018  IgA  20 - 320 mg/dL 119  IgG 600 - 1,540 mg/dL 844  IgM 50 - 300 mg/dL 156   SPEP Serum Protein Electrophoresis Latest Ref Rng &  Units 12/22/2019  Total Protein 6.1 - 8.1 g/dL 6.8  Albumin 3.8 - 4.8 g/dL -  Alpha-1 0.2 - 0.3 g/dL -  Alpha-2 0.5 - 0.9 g/dL -  Beta Globulin 0.4 - 0.6 g/dL -  Beta 2 0.2 - 0.5 g/dL -  Gamma Globulin 0.8 - 1.7 g/dL -   G6PD Lab Results  Component Value Date   G6PDH 17.9 11/19/2017   TPMT No results found for: TPMT   Chest x-ray:  Normal chest 05/16/41  Assessment/Plan:   Administrations This Visit    Certolizumab Pegol KIT 400 mg    Admin Date 01/19/2020 Action Given Dose 400 mg Route Subcutaneous Administered By Mariella Saa C, RPH-CPP         Patient tolerated injection without issue.   Patient prefers to have next injection at follow up appointment in September.  Patient due for CBC/CMP/TB gold in October.  Patient is to call and reschedule appointment if running a fever with signs/symptoms of infection, on antibiotics for active infection or has an upcoming invasive procedure.  All questions encouraged and answered.  Instructed patient to call with any further questions or concerns.  Mariella Saa, PharmD, [place], CPP Clinical Specialty Pharmacist (Rheumatology and Pulmonology)  01/19/2020 11:36 AM

## 2020-02-18 NOTE — Progress Notes (Deleted)
Office Visit Note  Patient: April Shepard             Date of Birth: July 20, 1940           MRN: [id number]             PCP: Colbert Coyer, MD Referring: Colbert Coyer, MD Visit Date: 03/02/2020 Occupation: @GUAROCC @  Subjective:  No chief complaint on file.   History of Present Illness: April Shepard is a 79 y.o. female ***   Activities of Daily Living:  Patient reports morning stiffness for *** {minute/hour:19697}.   Patient {ACTIONS;DENIES/REPORTS:21021675::"Denies"} nocturnal pain.  Difficulty dressing/grooming: {ACTIONS;DENIES/REPORTS:21021675::"Denies"} Difficulty climbing stairs: {ACTIONS;DENIES/REPORTS:21021675::"Denies"} Difficulty getting out of chair: {ACTIONS;DENIES/REPORTS:21021675::"Denies"} Difficulty using hands for taps, buttons, cutlery, and/or writing: {ACTIONS;DENIES/REPORTS:21021675::"Denies"}  No Rheumatology ROS completed.   PMFS History:  Patient Active Problem List   Diagnosis Date Noted  . Dyslipidemia 07/23/2018  . 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 High risk medication use 10/22/2016  . Trigger finger, right ring finger 10/22/2016  . Osteopenia 05/13/2016  . Osteoarthritis of both hands 05/13/2016  . RA (rheumatoid arthritis) ([facility]) 05/13/2016    Past Medical History:  Diagnosis Date  . Osteoarthritis of both hands 05/13/2016  . Osteopenia 05/13/2016  . RA (rheumatoid arthritis) (HCC) 05/13/2016    Family History  Problem Relation Age of Onset  . Breast cancer Mother   . Heart attack Father   . Melanoma Brother    Past Surgical History:  Procedure Laterality Date  . CHOLECYSTECTOMY     Social History   Social History Narrative  . Not on file   Immunization History  Administered Date(s) Administered  . Influenza, High Dose Seasonal PF 03/27/2018     Objective: Vital Signs: There were no vitals taken for this visit.   Physical Exam   Musculoskeletal Exam: ***  CDAI  Exam: CDAI Score: -- Patient Global: --; Provider Global: -- Swollen: --; Tender: -- Joint Exam 03/02/2020   No joint exam has been documented for this visit   There is currently no information documented on the homunculus. Go to the Rheumatology activity and complete the homunculus joint exam.  Investigation: No additional findings.  Imaging: No results found.  Recent Labs: Lab Results  Component Value Date   WBC 5.7 12/22/2019   HGB 14.1 12/22/2019   PLT 262 12/22/2019   NA 138 12/22/2019   K 3.8 12/22/2019   CL 97 (L) 12/22/2019   CO2 33 (H) 12/22/2019   GLUCOSE 89 12/22/2019   BUN 15 12/22/2019   CREATININE 0.68 12/22/2019   BILITOT 0.6 12/22/2019   ALKPHOS 59 05/14/2016   AST 28 12/22/2019   ALT 25 12/22/2019   PROT 6.8 12/22/2019   ALBUMIN 4.5 05/14/2016   CALCIUM 10.1 12/22/2019   GFRAA 96 12/22/2019   QFTBGOLDPLUS NEGATIVE 03/25/2019    Speciality Comments: no baseline biologic labs  Procedures:  No procedures performed Allergies: Patient has no known allergies.   Assessment / Plan:     Visit Diagnoses: No diagnosis found.  Orders: No orders of the defined types were placed in this encounter.  No orders of the defined types were placed in this encounter.   Face-to-face time spent with patient was *** minutes. Greater than 50% of time was spent in counseling and coordination of care.  Follow-Up Instructions: No follow-ups on file.   05/25/2019, CMA  Note - This record has been created using Ellen Henri.  Chart creation errors  have been sought, but may not always  have been located. Such creation errors do not reflect on  the standard of medical care.

## 2020-02-24 ENCOUNTER — Ambulatory Visit: Payer: MEDICARE | Admitting: Rheumatology

## 2020-03-02 ENCOUNTER — Telehealth: Payer: Self-pay | Admitting: Rheumatology

## 2020-03-02 ENCOUNTER — Ambulatory Visit: Payer: MEDICARE | Admitting: Physician Assistant

## 2020-03-02 NOTE — Telephone Encounter (Signed)
Patient called to reschedule missed appointment. Patient wants to reschedule to a day that she can get her injection also. Please advise where to schedule patient.

## 2020-03-03 ENCOUNTER — Other Ambulatory Visit: Payer: Self-pay | Admitting: Rheumatology

## 2020-03-03 NOTE — Telephone Encounter (Signed)
Patient called back to reschedule. Adrienne schedule appointment based on patient's availability.

## 2020-03-03 NOTE — Telephone Encounter (Signed)
Attempted to contact patient and left message on machine to advise patient to call the office to reschedule the appointment.

## 2020-03-03 NOTE — Telephone Encounter (Signed)
Last Visit: 09/23/2019 Next Visit: 03/20/2020 Labs: 12/22/2019 CBC/CMP within normal limits. She should continue current medication regimen of Cimzia and methotrexate.  Current Dose per office note 09/23/2019: methotrexate 0.79mL every 7 days DX: Rheumatoid arthritis of multiple sites with negative rheumatoid factor   Okay to refill Methotrexate?

## 2020-03-07 NOTE — Progress Notes (Signed)
Office Visit Note  Patient: April Shepard             Date of Birth: Jan 20, 1941           MRN: [id number]             PCP: Artis Delay, MD Referring: Artis Delay, MD Visit Date: 03/20/2020 Occupation: '@GUAROCC' @  Subjective:  Medication monitoring   History of Present Illness: April Shepard is a 79 y.o. female with history of seronegative rheumatoid arthritis and osteoarthritis.  She is on Cimzia sq injections every 4 weeks, MTX 0.6 ml sq injections once weekly, and folic acid 2 mg po daily.  She presents today for her monthly cimzia injection.  She denies any recent rheumatoid arthritis flares. She denies any joint pain or joint swelling at this time.  She denies any morning stiffness or nocturnal pain.  She denies any recent infections.  She has received both covid-19 vaccines and plans on receiving the 3rd dose.    Activities of Daily Living:  Patient reports morning stiffness for 0 minutes.   Patient Denies nocturnal pain.  Difficulty dressing/grooming: Denies Difficulty climbing stairs: Denies Difficulty getting out of chair: Denies Difficulty using hands for taps, buttons, cutlery, and/or writing: Denies  Review of Systems  Constitutional: Negative for fatigue.  HENT: Negative for mouth sores, mouth dryness and nose dryness.   Eyes: Negative for pain, itching, visual disturbance and dryness.  Respiratory: Negative for cough, hemoptysis, shortness of breath and difficulty breathing.   Cardiovascular: Negative for chest pain, palpitations, hypertension and swelling in legs/feet.  Gastrointestinal: Negative for blood in stool, constipation and diarrhea.  Endocrine: Negative for increased urination.  Genitourinary: Negative for difficulty urinating and painful urination.  Musculoskeletal: Negative for arthralgias, joint pain, joint swelling, myalgias, muscle weakness, morning stiffness, muscle tenderness and myalgias.  Skin: Negative for color change,  pallor, rash, hair loss, nodules/bumps, redness, skin tightness, ulcers and sensitivity to sunlight.  Allergic/Immunologic: Negative for susceptible to infections.  Neurological: Negative for dizziness, numbness, headaches, memory loss and weakness.  Hematological: Negative for bruising/bleeding tendency and swollen glands.  Psychiatric/Behavioral: Negative for depressed mood, confusion and sleep disturbance. The patient is not nervous/anxious.     PMFS History:  Patient Active Problem List   Diagnosis Date Noted  . Dyslipidemia 07/23/2018  . 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 High risk medication use 10/22/2016  . Trigger finger, right ring finger 10/22/2016  . Osteopenia 05/13/2016  . Osteoarthritis of both hands 05/13/2016  . RA (rheumatoid arthritis) ([place]) 05/13/2016    Past Medical History:  Diagnosis Date  . Osteoarthritis of both hands 05/13/2016  . Osteopenia 05/13/2016  . RA (rheumatoid arthritis) ([place]) 05/13/2016    Family History  Problem Relation Age of Onset  . Breast cancer Mother   . Heart attack Father   . Melanoma Brother    Past Surgical History:  Procedure Laterality Date  . CHOLECYSTECTOMY     Social History   Social History Narrative  . Not on file   Immunization History  Administered Date(s) Administered  . Influenza, High Dose Seasonal PF 03/27/2018  . Moderna SARS-COVID-2 Vaccination 08/17/2019, 09/14/2019     Objective: Vital Signs: BP 122/76 (BP Location: Left Arm, Patient Position: Sitting, Cuff Size: Small)   Pulse 77   Resp 12   Ht '5\' 3"'  (1.6 m)   Wt 134 lb (60.8 kg)   BMI 23.74 kg/m    Physical Exam Vitals and  nursing note reviewed.  Constitutional:      Appearance: She is well-developed.  HENT:     Head: Normocephalic and atraumatic.  Eyes:     Conjunctiva/sclera: Conjunctivae normal.  Pulmonary:     Effort: Pulmonary effort is normal.  Abdominal:     Palpations: Abdomen is soft.    Musculoskeletal:     Cervical back: Normal range of motion.  Skin:    General: Skin is warm and dry.     Capillary Refill: Capillary refill takes less than 2 seconds.  Neurological:     Mental Status: She is alert and oriented to person, place, and time.  Psychiatric:        Behavior: Behavior normal.      Musculoskeletal Exam: C-spine, thoracic spine, and lumbar spine good ROM.  Shoulder joints, elbow joints, wrist joints, MCPs, PIPs, and DIPs good ROM with no synovitis.  PIP and DIP thickening consistent with osteoarthritis of both hands.  [place] joint prominence bilaterally.  Complete fist formation bilaterally.  Hip joints, knee joints, and ankle joints good ROM with no discomfort.  No warmth or effusion of knee joints.  No tenderness or swelling of ankle joints.  No tenderness of MTP joints.  Overcrowding of left great toe and second digit.  PIP and DIP thickening consistent with osteoarthritis of both feet.   CDAI Exam: CDAI Score: 0  Patient Global: 0 mm; Provider Global: 0 mm Swollen: 0 ; Tender: 0  Joint Exam 03/20/2020   No joint exam has been documented for this visit   There is currently no information documented on the homunculus. Go to the Rheumatology activity and complete the homunculus joint exam.  Investigation: No additional findings.  Imaging: No results found.  Recent Labs: Lab Results  Component Value Date   WBC 5.7 12/22/2019   HGB 14.1 12/22/2019   PLT 262 12/22/2019   NA 138 12/22/2019   K 3.8 12/22/2019   CL 97 (L) 12/22/2019   CO2 33 (H) 12/22/2019   GLUCOSE 89 12/22/2019   BUN 15 12/22/2019   CREATININE 0.68 12/22/2019   BILITOT 0.6 12/22/2019   ALKPHOS 59 05/14/2016   AST 28 12/22/2019   ALT 25 12/22/2019   PROT 6.8 12/22/2019   ALBUMIN 4.5 05/14/2016   CALCIUM 10.1 12/22/2019   GFRAA 96 12/22/2019   QFTBGOLDPLUS NEGATIVE 03/25/2019    Speciality Comments: no baseline biologic labs  Procedures:  No procedures performed Allergies:  Patient has no known allergies.   Assessment / Plan:     Visit Diagnoses: Rheumatoid arthritis of multiple sites with negative rheumatoid factor ([place]) -She has no synovitis or joint tenderness on exam today.  She has not had any recent rheumatoid arthritis flares. She is clinically doing well on Cimzia 400 mg sq injections once every 28 days, MTX 0.6 ml sq injections once weekly, and folic acid 2 mg po daily.  She presents today for her monthly Cimzia injection.  She has not missed any doses of methotrexate or Cimzia recently.  She is not experiencing any joint pain, joint swelling, nocturnal pain, and morning stiffness at this time.  She will continue on the current treatment regimen.  She was advised to notify us if she develops increased joint pain or joint swelling.  She will follow-up in the office in 5 months.  Plan: Certolizumab Pegol KIT 400 mg  High risk medication use - Cimzia in office injection 400 mg every 28 days, methotrexate 0.51m sq injections every 7 days, and folic  acid 1 mg 2 tablets daily.  CBC and CMP were drawn on 12/22/19.  She is due to update lab work, but she would like to have lab work drawn at her next [employer] office visit.  Lab tech is not in the office today and she does not want to go to another facility for lab work.  Future order for TB gold was placed today.  Standing order for CBC and CMP are in place.  - Plan: QuantiFERON-TB Gold Plus She has not had any recent infections.  She was advised to hold MTX and postpone cimzia injections if she develops signs or symptoms of an infection and to resume once the infection has completely cleared.  She has received both Moderna vaccinations and is planning on receiving the 3rd dose.  She was advised to avoid taking NSAIDs and tylenol 24 hours prior to the 3rd dose.  She was advised to hold MTX 1 week after the 3rd dose.  She was advised to notify us or her PCP if she develops the covid-19 infection in order to proceed with the antibody  infusion.    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  Complete fist formation bilaterally.  She has no joint tenderness or inflammation on exam.   Trigger finger, right ring finger: Resolved   Osteopenia of multiple sites - DEXA is not in Epic.  She was advised to reach out to her gynecologist to order an updated DEXA. She continues to take vitamin D 2,000 units daily.   Other medical conditions are listed as follows:   History of Clostridium difficile infection  Dyslipidemia  Orders: Orders Placed This Encounter  Procedures  . QuantiFERON-TB Gold Plus   Meds ordered this encounter  Medications  . Certolizumab Pegol KIT 400 mg     Follow-Up Instructions: Return in about 5 months (around 08/18/2020) for Rheumatoid arthritis, Osteoarthritis.   Ofilia Neas, PA-C  Note - This record has been created using Dragon software.  Chart creation errors have been sought, but may not always  have been located. Such creation errors do not reflect on  the standard of medical care.

## 2020-03-20 ENCOUNTER — Ambulatory Visit (INDEPENDENT_AMBULATORY_CARE_PROVIDER_SITE_OTHER): Payer: MEDICARE | Admitting: Physician Assistant

## 2020-03-20 ENCOUNTER — Other Ambulatory Visit: Payer: Self-pay

## 2020-03-20 ENCOUNTER — Encounter: Payer: Self-pay | Admitting: Physician Assistant

## 2020-03-20 VITALS — BP 122/76 | HR 77 | Resp 12 | Ht 63.0 in | Wt 134.0 lb

## 2020-03-20 DIAGNOSIS — M19041 Primary osteoarthritis, right hand: Secondary | ICD-10-CM | POA: Diagnosis not present

## 2020-03-20 DIAGNOSIS — M19042 Primary osteoarthritis, left hand: Secondary | ICD-10-CM

## 2020-03-20 DIAGNOSIS — M0609 Rheumatoid arthritis without rheumatoid factor, multiple sites: Secondary | ICD-10-CM

## 2020-03-20 DIAGNOSIS — H35389 Toxic maculopathy, unspecified eye: Secondary | ICD-10-CM | POA: Diagnosis not present

## 2020-03-20 DIAGNOSIS — Z8619 Personal history of other infectious and parasitic diseases: Secondary | ICD-10-CM

## 2020-03-20 DIAGNOSIS — Z79899 Other long term (current) drug therapy: Secondary | ICD-10-CM | POA: Diagnosis not present

## 2020-03-20 DIAGNOSIS — T372X5A Adverse effect of antimalarials and drugs acting on other blood protozoa, initial encounter: Secondary | ICD-10-CM

## 2020-03-20 DIAGNOSIS — E785 Hyperlipidemia, unspecified: Secondary | ICD-10-CM

## 2020-03-20 DIAGNOSIS — M8589 Other specified disorders of bone density and structure, multiple sites: Secondary | ICD-10-CM

## 2020-03-20 DIAGNOSIS — M65341 Trigger finger, right ring finger: Secondary | ICD-10-CM

## 2020-03-20 MED ORDER — CERTOLIZUMAB PEGOL 2 X 200 MG ~~LOC~~ KIT
400.0000 mg | PACK | Freq: Once | SUBCUTANEOUS | Status: AC
  Administered 2020-03-20: 400 mg via SUBCUTANEOUS

## 2020-03-20 NOTE — Progress Notes (Signed)
Pharmacy Note  Subjective:   Patient presents to clinic today to receive monthly dose of Cimzia.  Patient running a fever or have signs/symptoms of infection? No  Patient currently on antibiotics for the treatment of infection? No  Patient have any upcoming invasive procedures/surgeries? No  Objective: CMP     Component Value Date/Time   NA 138 12/22/2019 1112   NA 138 02/20/2016 0000   K 3.8 12/22/2019 1112   CL 97 (L) 12/22/2019 1112   CO2 33 (H) 12/22/2019 1112   GLUCOSE 89 12/22/2019 1112   BUN 15 12/22/2019 1112   BUN 13 02/20/2016 0000   CREATININE 0.68 12/22/2019 1112   CALCIUM 10.1 12/22/2019 1112   PROT 6.8 12/22/2019 1112   ALBUMIN 4.5 05/14/2016 1314   AST 28 12/22/2019 1112   ALT 25 12/22/2019 1112   ALKPHOS 59 05/14/2016 1314   BILITOT 0.6 12/22/2019 1112   GFRNONAA 83 12/22/2019 1112   GFRAA 96 12/22/2019 1112    CBC    Component Value Date/Time   WBC 5.7 12/22/2019 1112   RBC 4.35 12/22/2019 1112   HGB 14.1 12/22/2019 1112   HCT 41.4 12/22/2019 1112   PLT 262 12/22/2019 1112   MCV 95.2 12/22/2019 1112   MCH 32.4 12/22/2019 1112   MCHC 34.1 12/22/2019 1112   RDW 12.4 12/22/2019 1112   LYMPHSABS 1,687 12/22/2019 1112   MONOABS 434 05/14/2016 1314   EOSABS 103 12/22/2019 1112   BASOSABS 63 12/22/2019 1112    Baseline Immunosuppressant Therapy Labs TB GOLD Quantiferon TB Gold Latest Ref Rng & Units 03/25/2019  Quantiferon TB Gold Plus NEGATIVE NEGATIVE   Hepatitis Panel Hepatitis Latest Ref Rng & Units 03/05/2018  Hep B Surface Ag NON-REACTI NON-REACTIVE  Hep B IgM NON-REACTI NON-REACTIVE  Hep C Ab NON-REACTI NON-REACTIVE  Hep C Ab NON-REACTI NON-REACTIVE   HIV Lab Results  Component Value Date   HIV NON-REACTIVE 03/05/2018   Immunoglobulins Immunoglobulin Electrophoresis Latest Ref Rng & Units 03/05/2018  IgA  20 - 320 mg/dL 119  IgG 600 - 1,540 mg/dL 844  IgM 50 - 300 mg/dL 156   SPEP Serum Protein Electrophoresis Latest Ref Rng &  Units 12/22/2019  Total Protein 6.1 - 8.1 g/dL 6.8  Albumin 3.8 - 4.8 g/dL -  Alpha-1 0.2 - 0.3 g/dL -  Alpha-2 0.5 - 0.9 g/dL -  Beta Globulin 0.4 - 0.6 g/dL -  Beta 2 0.2 - 0.5 g/dL -  Gamma Globulin 0.8 - 1.7 g/dL -   G6PD Lab Results  Component Value Date   G6PDH 17.9 11/19/2017   TPMT No results found for: TPMT   Administrations This Visit    Certolizumab Pegol KIT 400 mg    Admin Date 03/20/2020 Action Given Dose 400 mg Route Subcutaneous Administered By Carole Binning, LPN           Assessment/Plan:   Patient tolerated injection well.   Appointment for next injection scheduled for 04/17/2020.  Patient due for labs in October.  Patient is to call and reschedule appointment if running a fever with signs/symptoms of infection, on antibiotics for active infection or has an upcoming invasive procedure.  All questions encouraged and answered.  Instructed patient to call with any further questions or concerns.

## 2020-03-20 NOTE — Patient Instructions (Signed)
COVID-19 vaccine recommendations:   COVID-19 vaccine is recommended for everyone (unless you are allergic to a vaccine component), even if you are on a medication that suppresses your immune system.   If you are on Methotrexate, Cellcept (mycophenolate), Rinvoq, Xeljanz, and Olumiant- hold the medication for 1 week after each vaccine. Hold Methotrexate for 2 weeks after the single dose COVID-19 vaccine.   If you are on Orencia subcutaneous injection - hold medication one week prior to and one week after the first COVID-19 vaccine dose (only).   If you are on Orencia IV infusions- time vaccination administration so that the first COVID-19 vaccination will occur four weeks after the infusion and postpone the subsequent infusion by one week.   If you are on Cyclophosphamide or Rituxan infusions please contact your doctor prior to receiving the COVID-19 vaccine.   Do not take Tylenol or any anti-inflammatory medications (NSAIDs) 24 hours prior to the COVID-19 vaccination.   There is no direct evidence about the efficacy of the COVID-19 vaccine in individuals who are on medications that suppress the immune system.   Even if you are fully vaccinated, and you are on any medications that suppress your immune system, please continue to wear a mask, maintain at least six feet social distance and practice hand hygiene.   If you develop a COVID-19 infection, please contact your PCP or our office to determine if you need antibody infusion.  The booster vaccine is now available for immunocompromised patients. It is advised that if you had Pfizer vaccine you should get Pfizer booster.  If you had a Moderna vaccine then you should get a Moderna booster. Johnson and Johnson does not have a booster vaccine at this time.  Please see the following web sites for updated information.    https://[email or web address]/[email or web address]/[email or web address]-Vaccination-Patient-Resources.[email or web address]  [email or web address]://[email or web address]/About-Us/[email or web address]-Releases/ID/1159    Standing Labs We placed an order today for your standing lab work.   Please have your standing labs drawn in October and every 3 months   If possible, please have your labs drawn 2 weeks prior to your appointment so that the provider can discuss your results at your appointment.  We have open lab daily Monday through Thursday from 8:30-12:30 PM and 1:30-4:30 PM and Friday from 8:30-12:30 PM and 1:30-4:00 PM at the office of Dr. Shaili Deveshwar, [facility].   Please be advised, patients with office appointments requiring lab work will take precedents over walk-in lab work.  If possible, please come for your lab work on Monday and Friday afternoons, as you may experience shorter wait times. The office is located at [address], [address], [place], [place] [address] No appointment is necessary.   Labs are drawn by [facility]. Please bring your co-pay at the time of your lab draw.  You may receive a bill from [facility] for your lab work.  If you wish to have your labs drawn at another location, please call the office 24 hours in advance to send orders.  If you have any questions regarding directions or hours of operation,  please call [phone].   As a reminder, please drink plenty of water prior to coming for your lab work. Thanks!   

## 2020-03-31 ENCOUNTER — Other Ambulatory Visit: Payer: Self-pay | Admitting: Physician Assistant

## 2020-03-31 NOTE — Telephone Encounter (Signed)
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

## 2020-04-17 ENCOUNTER — Ambulatory Visit: Payer: MEDICARE

## 2020-04-18 ENCOUNTER — Telehealth: Payer: Self-pay

## 2020-04-18 NOTE — Telephone Encounter (Signed)
Noted  

## 2020-04-18 NOTE — Telephone Encounter (Signed)
Patient called stating she still has a mild fever and very tired.   Patient states she will call on Monday, 04/24/20 to reschedule her nurse appointment for Cimzia.

## 2020-04-20 ENCOUNTER — Other Ambulatory Visit: Payer: Self-pay | Admitting: Physician Assistant

## 2020-04-27 ENCOUNTER — Other Ambulatory Visit: Payer: Self-pay

## 2020-04-27 ENCOUNTER — Ambulatory Visit (INDEPENDENT_AMBULATORY_CARE_PROVIDER_SITE_OTHER): Payer: MEDICARE | Admitting: *Deleted

## 2020-04-27 VITALS — BP 102/64 | HR 77

## 2020-04-27 DIAGNOSIS — M0609 Rheumatoid arthritis without rheumatoid factor, multiple sites: Secondary | ICD-10-CM | POA: Diagnosis not present

## 2020-04-27 DIAGNOSIS — Z79899 Other long term (current) drug therapy: Secondary | ICD-10-CM

## 2020-04-27 MED ORDER — CERTOLIZUMAB PEGOL 2 X 200 MG ~~LOC~~ KIT
400.0000 mg | PACK | Freq: Once | SUBCUTANEOUS | Status: AC
  Administered 2020-04-27: 400 mg via SUBCUTANEOUS

## 2020-04-27 NOTE — Progress Notes (Signed)
Pharmacy Note  Subjective:   Patient presents to clinic today to receive monthly dose of Cimzia.  Patient running a fever or have signs/symptoms of infection? No  Patient currently on antibiotics for the treatment of infection? No  Patient have any upcoming invasive procedures/surgeries? No  Objective: CMP     Component Value Date/Time   NA 138 12/22/2019 1112   NA 138 02/20/2016 0000   K 3.8 12/22/2019 1112   CL 97 (L) 12/22/2019 1112   CO2 33 (H) 12/22/2019 1112   GLUCOSE 89 12/22/2019 1112   BUN 15 12/22/2019 1112   BUN 13 02/20/2016 0000   CREATININE 0.68 12/22/2019 1112   CALCIUM 10.1 12/22/2019 1112   PROT 6.8 12/22/2019 1112   ALBUMIN 4.5 05/14/2016 1314   AST 28 12/22/2019 1112   ALT 25 12/22/2019 1112   ALKPHOS 59 05/14/2016 1314   BILITOT 0.6 12/22/2019 1112   GFRNONAA 83 12/22/2019 1112   GFRAA 96 12/22/2019 1112    CBC    Component Value Date/Time   WBC 5.7 12/22/2019 1112   RBC 4.35 12/22/2019 1112   HGB 14.1 12/22/2019 1112   HCT 41.4 12/22/2019 1112   PLT 262 12/22/2019 1112   MCV 95.2 12/22/2019 1112   MCH 32.4 12/22/2019 1112   MCHC 34.1 12/22/2019 1112   RDW 12.4 12/22/2019 1112   LYMPHSABS 1,687 12/22/2019 1112   MONOABS 434 05/14/2016 1314   EOSABS 103 12/22/2019 1112   BASOSABS 63 12/22/2019 1112    Baseline Immunosuppressant Therapy Labs TB GOLD Quantiferon TB Gold Latest Ref Rng & Units 03/25/2019  Quantiferon TB Gold Plus NEGATIVE NEGATIVE   Hepatitis Panel Hepatitis Latest Ref Rng & Units 03/05/2018  Hep B Surface Ag NON-REACTI NON-REACTIVE  Hep B IgM NON-REACTI NON-REACTIVE  Hep C Ab NON-REACTI NON-REACTIVE  Hep C Ab NON-REACTI NON-REACTIVE   HIV Lab Results  Component Value Date   HIV NON-REACTIVE 03/05/2018   Immunoglobulins Immunoglobulin Electrophoresis Latest Ref Rng & Units 03/05/2018  IgA  20 - 320 mg/dL 119  IgG 600 - 1,540 mg/dL 844  IgM 50 - 300 mg/dL 156   SPEP Serum Protein Electrophoresis Latest Ref Rng &  Units 12/22/2019  Total Protein 6.1 - 8.1 g/dL 6.8  Albumin 3.8 - 4.8 g/dL -  Alpha-1 0.2 - 0.3 g/dL -  Alpha-2 0.5 - 0.9 g/dL -  Beta Globulin 0.4 - 0.6 g/dL -  Beta 2 0.2 - 0.5 g/dL -  Gamma Globulin 0.8 - 1.7 g/dL -   G6PD Lab Results  Component Value Date   G6PDH 17.9 11/19/2017   TPMT No results found for: TPMT   Chest x-ray: normal chest 10/18/2005  Administrations This Visit    Certolizumab Pegol KIT 400 mg    Admin Date 04/27/2020 Action Given Dose 400 mg Route Subcutaneous Administered By Carole Binning, LPN          Assessment/Plan:   Patient tolerated injection well.   Appointment for next injection scheduled for 05/25/2020.  Patient due for labs today and had while in office.  Patient is to call and reschedule appointment if running a fever with signs/symptoms of infection, on antibiotics for active infection or has an upcoming invasive procedure.  All questions encouraged and answered.  Instructed patient to call with any further questions or concerns.

## 2020-04-28 NOTE — Progress Notes (Signed)
Anemia noted.  Please notify patient and forward results to her PCP.  She would need evaluation of anemia.  CMP is normal.

## 2020-04-29 LAB — COMPLETE METABOLIC PANEL WITH GFR
AG Ratio: 1.6 (calc) (ref 1.0–2.5)
ALT: 25 U/L (ref 6–29)
AST: 23 U/L (ref 10–35)
Albumin: 3.7 g/dL (ref 3.6–5.1)
Alkaline phosphatase (APISO): 139 U/L (ref 37–153)
BUN: 25 mg/dL (ref 7–25)
CO2: 31 mmol/L (ref 20–32)
Calcium: 9 mg/dL (ref 8.6–10.4)
Chloride: 99 mmol/L (ref 98–110)
Creat: 0.85 mg/dL (ref 0.60–0.93)
GFR, Est African American: 76 mL/min/{1.73_m2} (ref >=60)
GFR, Est Non African American: 65 mL/min/{1.73_m2} (ref >=60)
Globulin: 2.3 g/dL (calc) (ref 1.9–3.7)
Glucose, Bld: 95 mg/dL (ref 65–139)
Potassium: 3.7 mmol/L (ref 3.5–5.3)
Sodium: 137 mmol/L (ref 135–146)
Total Bilirubin: 0.6 mg/dL (ref 0.2–1.2)
Total Protein: 6 g/dL — ABNORMAL LOW (ref 6.1–8.1)

## 2020-04-29 LAB — CBC WITH DIFFERENTIAL/PLATELET
Absolute Monocytes: 242 cells/uL (ref 200–950)
Basophils Absolute: 101 cells/uL (ref 0–200)
Basophils Relative: 1.3 %
Eosinophils Absolute: 47 cells/uL (ref 15–500)
Eosinophils Relative: 0.6 %
HCT: 33.3 % — ABNORMAL LOW (ref 35.0–45.0)
Hemoglobin: 11.2 g/dL — ABNORMAL LOW (ref 11.7–15.5)
Lymphs Abs: 1505 cells/uL (ref 850–3900)
MCH: 32.2 pg (ref 27.0–33.0)
MCHC: 33.6 g/dL (ref 32.0–36.0)
MCV: 95.7 fL (ref 80.0–100.0)
MPV: 8.5 fL (ref 7.5–12.5)
Monocytes Relative: 3.1 %
Neutro Abs: 5905 cells/uL (ref 1500–7800)
Neutrophils Relative %: 75.7 %
Platelets: 359 10*3/uL (ref 140–400)
RBC: 3.48 10*6/uL — ABNORMAL LOW (ref 3.80–5.10)
RDW: 12.5 % (ref 11.0–15.0)
Total Lymphocyte: 19.3 %
WBC: 7.8 10*3/uL (ref 3.8–10.8)

## 2020-04-29 LAB — QUANTIFERON-TB GOLD PLUS
Mitogen-NIL: 7.53 IU/mL
NIL: 0.06 IU/mL
QuantiFERON-TB Gold Plus: NEGATIVE
TB1-NIL: 0 IU/mL
TB2-NIL: 0.01 IU/mL

## 2020-05-01 NOTE — Progress Notes (Signed)
TB gold negative

## 2020-05-15 ENCOUNTER — Telehealth: Payer: Self-pay

## 2020-05-15 MED ORDER — FOLIC ACID 1 MG PO TABS
2.0000 mg | ORAL_TABLET | Freq: Every day | ORAL | 2 refills | Status: DC
Start: 2020-05-15 — End: 2021-02-13

## 2020-05-15 NOTE — Telephone Encounter (Signed)
Patient left a voicemail requesting prescription refill of Folic Acid to be sent to [facility] in [place], [place].

## 2020-05-15 NOTE — Telephone Encounter (Signed)
Last Visit: 03/20/2020 Next Visit: 08/18/2020   Okay to refill per Dr. Corliss Skains

## 2020-05-25 ENCOUNTER — Ambulatory Visit (INDEPENDENT_AMBULATORY_CARE_PROVIDER_SITE_OTHER): Payer: MEDICARE | Admitting: *Deleted

## 2020-05-25 ENCOUNTER — Ambulatory Visit: Payer: MEDICARE

## 2020-05-25 ENCOUNTER — Other Ambulatory Visit: Payer: Self-pay

## 2020-05-25 VITALS — BP 123/72 | HR 92

## 2020-05-25 DIAGNOSIS — M0609 Rheumatoid arthritis without rheumatoid factor, multiple sites: Secondary | ICD-10-CM | POA: Diagnosis not present

## 2020-05-25 MED ORDER — CERTOLIZUMAB PEGOL 2 X 200 MG ~~LOC~~ KIT
400.0000 mg | PACK | Freq: Once | SUBCUTANEOUS | Status: AC
  Administered 2020-05-25: 400 mg via SUBCUTANEOUS

## 2020-05-25 NOTE — Patient Instructions (Signed)
Next appt 06/22/20 at 10:30am.

## 2020-05-25 NOTE — Progress Notes (Signed)
Pharmacy Note  Subjective:   Patient presents to clinic today to receive monthly dose of Cimzia.  Patient running a fever or have signs/symptoms of infection? No  Patient currently on antibiotics for the treatment of infection? No  Patient have any upcoming invasive procedures/surgeries? No  Objective: CMP     Component Value Date/Time   NA 137 04/27/2020 1032   NA 138 02/20/2016 0000   K 3.7 04/27/2020 1032   CL 99 04/27/2020 1032   CO2 31 04/27/2020 1032   GLUCOSE 95 04/27/2020 1032   BUN 25 04/27/2020 1032   BUN 13 02/20/2016 0000   CREATININE 0.85 04/27/2020 1032   CALCIUM 9.0 04/27/2020 1032   PROT 6.0 (L) 04/27/2020 1032   ALBUMIN 4.5 05/14/2016 1314   AST 23 04/27/2020 1032   ALT 25 04/27/2020 1032   ALKPHOS 59 05/14/2016 1314   BILITOT 0.6 04/27/2020 1032   GFRNONAA 65 04/27/2020 1032   GFRAA 76 04/27/2020 1032    CBC    Component Value Date/Time   WBC 7.8 04/27/2020 1032   RBC 3.48 (L) 04/27/2020 1032   HGB 11.2 (L) 04/27/2020 1032   HCT 33.3 (L) 04/27/2020 1032   PLT 359 04/27/2020 1032   MCV 95.7 04/27/2020 1032   MCH 32.2 04/27/2020 1032   MCHC 33.6 04/27/2020 1032   RDW 12.5 04/27/2020 1032   LYMPHSABS 1,505 04/27/2020 1032   MONOABS 434 05/14/2016 1314   EOSABS 47 04/27/2020 1032   BASOSABS 101 04/27/2020 1032    Baseline Immunosuppressant Therapy Labs TB GOLD Quantiferon TB Gold Latest Ref Rng & Units 04/27/2020  Quantiferon TB Gold Plus NEGATIVE NEGATIVE   Hepatitis Panel Hepatitis Latest Ref Rng & Units 03/05/2018  Hep B Surface Ag NON-REACTI NON-REACTIVE  Hep B IgM NON-REACTI NON-REACTIVE  Hep C Ab NON-REACTI NON-REACTIVE  Hep C Ab NON-REACTI NON-REACTIVE   HIV Lab Results  Component Value Date   HIV NON-REACTIVE 03/05/2018   Immunoglobulins Immunoglobulin Electrophoresis Latest Ref Rng & Units 03/05/2018  IgA  20 - 320 mg/dL 119  IgG 600 - 1,540 mg/dL 844  IgM 50 - 300 mg/dL 156   SPEP Serum Protein Electrophoresis Latest  Ref Rng & Units 04/27/2020  Total Protein 6.1 - 8.1 g/dL 6.0(L)  Albumin 3.8 - 4.8 g/dL -  Alpha-1 0.2 - 0.3 g/dL -  Alpha-2 0.5 - 0.9 g/dL -  Beta Globulin 0.4 - 0.6 g/dL -  Beta 2 0.2 - 0.5 g/dL -  Gamma Globulin 0.8 - 1.7 g/dL -   G6PD Lab Results  Component Value Date   G6PDH 17.9 11/19/2017    Chest x-ray: 10/2005  Assessment/Plan:   Patient tolerated injection well.  Administrations This Visit    Certolizumab Pegol KIT 400 mg    Admin Date 05/25/2020 Action Given Dose 400 mg Route Subcutaneous Administered By Cassandria Anger, [facility]         Appointment for next injection scheduled for 06/22/20. Patient due for labs in 2 months.  Patient is to call and reschedule appointment if running a fever with signs/symptoms of infection, on antibiotics for active infection or has an upcoming invasive procedure.  All questions encouraged and answered.  Instructed patient to call with any further questions or concerns.

## 2020-06-22 ENCOUNTER — Ambulatory Visit: Payer: MEDICARE

## 2020-06-29 ENCOUNTER — Other Ambulatory Visit: Payer: Self-pay

## 2020-06-29 ENCOUNTER — Ambulatory Visit (INDEPENDENT_AMBULATORY_CARE_PROVIDER_SITE_OTHER): Payer: MEDICARE | Admitting: *Deleted

## 2020-06-29 DIAGNOSIS — M0609 Rheumatoid arthritis without rheumatoid factor, multiple sites: Secondary | ICD-10-CM | POA: Diagnosis not present

## 2020-06-29 MED ORDER — CERTOLIZUMAB PEGOL 2 X 200 MG ~~LOC~~ KIT
400.0000 mg | PACK | Freq: Once | SUBCUTANEOUS | Status: AC
  Administered 2020-06-29: 400 mg via SUBCUTANEOUS

## 2020-06-29 NOTE — Progress Notes (Signed)
Pharmacy Note  Subjective:   Patient presents to clinic today to receive monthly dose of Cimzia.  Patient running a fever or have signs/symptoms of infection? No  Patient currently on antibiotics for the treatment of infection? No  Patient have any upcoming invasive procedures/surgeries? No  Objective: CMP     Component Value Date/Time   NA 137 04/27/2020 1032   NA 138 02/20/2016 0000   K 3.7 04/27/2020 1032   CL 99 04/27/2020 1032   CO2 31 04/27/2020 1032   GLUCOSE 95 04/27/2020 1032   BUN 25 04/27/2020 1032   BUN 13 02/20/2016 0000   CREATININE 0.85 04/27/2020 1032   CALCIUM 9.0 04/27/2020 1032   PROT 6.0 (L) 04/27/2020 1032   ALBUMIN 4.5 05/14/2016 1314   AST 23 04/27/2020 1032   ALT 25 04/27/2020 1032   ALKPHOS 59 05/14/2016 1314   BILITOT 0.6 04/27/2020 1032   GFRNONAA 65 04/27/2020 1032   GFRAA 76 04/27/2020 1032    CBC    Component Value Date/Time   WBC 7.8 04/27/2020 1032   RBC 3.48 (L) 04/27/2020 1032   HGB 11.2 (L) 04/27/2020 1032   HCT 33.3 (L) 04/27/2020 1032   PLT 359 04/27/2020 1032   MCV 95.7 04/27/2020 1032   MCH 32.2 04/27/2020 1032   MCHC 33.6 04/27/2020 1032   RDW 12.5 04/27/2020 1032   LYMPHSABS 1,505 04/27/2020 1032   MONOABS 434 05/14/2016 1314   EOSABS 47 04/27/2020 1032   BASOSABS 101 04/27/2020 1032    Baseline Immunosuppressant Therapy Labs TB GOLD Quantiferon TB Gold Latest Ref Rng & Units 04/27/2020  Quantiferon TB Gold Plus NEGATIVE NEGATIVE   Hepatitis Panel Hepatitis Latest Ref Rng & Units 03/05/2018  Hep B Surface Ag NON-REACTI NON-REACTIVE  Hep B IgM NON-REACTI NON-REACTIVE  Hep C Ab NON-REACTI NON-REACTIVE  Hep C Ab NON-REACTI NON-REACTIVE   HIV Lab Results  Component Value Date   HIV NON-REACTIVE 03/05/2018   Immunoglobulins Immunoglobulin Electrophoresis Latest Ref Rng & Units 03/05/2018  IgA  20 - 320 mg/dL 119  IgG 600 - 1,540 mg/dL 844  IgM 50 - 300 mg/dL 156   SPEP Serum Protein Electrophoresis Latest  Ref Rng & Units 04/27/2020  Total Protein 6.1 - 8.1 g/dL 6.0(L)  Albumin 3.8 - 4.8 g/dL -  Alpha-1 0.2 - 0.3 g/dL -  Alpha-2 0.5 - 0.9 g/dL -  Beta Globulin 0.4 - 0.6 g/dL -  Beta 2 0.2 - 0.5 g/dL -  Gamma Globulin 0.8 - 1.7 g/dL -   G6PD Lab Results  Component Value Date   G6PDH 17.9 11/19/2017   TPMT No results found for: TPMT   Chest x-ray: 10/2005  Assessment/Plan:   Administrations This Visit    Certolizumab Pegol KIT 400 mg    Admin Date 06/29/2020 Action Given Dose 400 mg Route Subcutaneous Administered By Carole Binning, LPN          Patient tolerated injection well.   Appointment for next injection scheduled for 07/31/2020.  Patient due for labs in February 2022.  Patient is to call and reschedule appointment if running a fever with signs/symptoms of infection, on antibiotics for active infection or has an upcoming invasive procedure.  All questions encouraged and answered.  Instructed patient to call with any further questions or concerns.

## 2020-07-27 ENCOUNTER — Other Ambulatory Visit: Payer: Self-pay | Admitting: Physician Assistant

## 2020-07-27 NOTE — Telephone Encounter (Signed)
Last Visit: 10-/09/2019 Next Visit: 08/18/2020 Labs: 04/27/2020, Anemia noted. Please notify patient and forward results to her PCP. She would need evaluation of anemia. CMP is normal.  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

## 2020-07-31 ENCOUNTER — Ambulatory Visit (INDEPENDENT_AMBULATORY_CARE_PROVIDER_SITE_OTHER): Payer: MEDICARE | Admitting: *Deleted

## 2020-07-31 ENCOUNTER — Other Ambulatory Visit: Payer: Self-pay

## 2020-07-31 VITALS — BP 145/72 | HR 75

## 2020-07-31 DIAGNOSIS — M0609 Rheumatoid arthritis without rheumatoid factor, multiple sites: Secondary | ICD-10-CM | POA: Diagnosis not present

## 2020-07-31 MED ORDER — CERTOLIZUMAB PEGOL 2 X 200 MG ~~LOC~~ KIT
400.0000 mg | PACK | Freq: Once | SUBCUTANEOUS | Status: AC
  Administered 2020-07-31: 400 mg via SUBCUTANEOUS

## 2020-07-31 NOTE — Progress Notes (Signed)
Pharmacy Note  Subjective:   Patient presents to clinic today to receive monthly dose of Cimzia.  Patient running a fever or have signs/symptoms of infection? No  Patient currently on antibiotics for the treatment of infection? No  Patient have any upcoming invasive procedures/surgeries? No  Objective: CMP     Component Value Date/Time   NA 137 04/27/2020 1032   NA 138 02/20/2016 0000   K 3.7 04/27/2020 1032   CL 99 04/27/2020 1032   CO2 31 04/27/2020 1032   GLUCOSE 95 04/27/2020 1032   BUN 25 04/27/2020 1032   BUN 13 02/20/2016 0000   CREATININE 0.85 04/27/2020 1032   CALCIUM 9.0 04/27/2020 1032   PROT 6.0 (L) 04/27/2020 1032   ALBUMIN 4.5 05/14/2016 1314   AST 23 04/27/2020 1032   ALT 25 04/27/2020 1032   ALKPHOS 59 05/14/2016 1314   BILITOT 0.6 04/27/2020 1032   GFRNONAA 65 04/27/2020 1032   GFRAA 76 04/27/2020 1032    CBC    Component Value Date/Time   WBC 7.8 04/27/2020 1032   RBC 3.48 (L) 04/27/2020 1032   HGB 11.2 (L) 04/27/2020 1032   HCT 33.3 (L) 04/27/2020 1032   PLT 359 04/27/2020 1032   MCV 95.7 04/27/2020 1032   MCH 32.2 04/27/2020 1032   MCHC 33.6 04/27/2020 1032   RDW 12.5 04/27/2020 1032   LYMPHSABS 1,505 04/27/2020 1032   MONOABS 434 05/14/2016 1314   EOSABS 47 04/27/2020 1032   BASOSABS 101 04/27/2020 1032    Baseline Immunosuppressant Therapy Labs TB GOLD Quantiferon TB Gold Latest Ref Rng & Units 04/27/2020  Quantiferon TB Gold Plus NEGATIVE NEGATIVE   Hepatitis Panel Hepatitis Latest Ref Rng & Units 03/05/2018  Hep B Surface Ag NON-REACTI NON-REACTIVE  Hep B IgM NON-REACTI NON-REACTIVE  Hep C Ab NON-REACTI NON-REACTIVE  Hep C Ab NON-REACTI NON-REACTIVE   HIV Lab Results  Component Value Date   HIV NON-REACTIVE 03/05/2018   Immunoglobulins Immunoglobulin Electrophoresis Latest Ref Rng & Units 03/05/2018  IgA  20 - 320 mg/dL 119  IgG 600 - 1,540 mg/dL 844  IgM 50 - 300 mg/dL 156   SPEP Serum Protein Electrophoresis Latest  Ref Rng & Units 04/27/2020  Total Protein 6.1 - 8.1 g/dL 6.0(L)  Albumin 3.8 - 4.8 g/dL -  Alpha-1 0.2 - 0.3 g/dL -  Alpha-2 0.5 - 0.9 g/dL -  Beta Globulin 0.4 - 0.6 g/dL -  Beta 2 0.2 - 0.5 g/dL -  Gamma Globulin 0.8 - 1.7 g/dL -   G6PD Lab Results  Component Value Date   G6PDH 17.9 11/19/2017   TPMT No results found for: TPMT   Chest x-ray: 10/2005  Assessment/Plan:   Administrations This Visit    Certolizumab Pegol KIT 400 mg    Admin Date 07/31/2020 Action Given Dose 400 mg Route Subcutaneous Administered By Carole Binning, LPN          Patient tolerated injection well.    Appointment for next injection scheduled for 08/28/2020.  Patient due for labs in February 2022. Patient to have done closer to home.  Patient is to call and reschedule appointment if running a fever with signs/symptoms of infection, on antibiotics for active infection or has an upcoming invasive procedure.  All questions encouraged and answered.  Instructed patient to call with any further questions or concerns.

## 2020-08-08 ENCOUNTER — Telehealth: Payer: Self-pay

## 2020-08-08 NOTE — Telephone Encounter (Signed)
Patient is due for labs, her last labs were 04/27/2020. Patient would like to know if she can have them done at her appointment on 08/28/2020. She said it is a lot of trouble to go to an outside lab in [place]. Please advise.

## 2020-08-08 NOTE — Telephone Encounter (Signed)
Patient left a voicemail requesting a return call to let her know if she needs to have labwork before her 08/28/20 appointment.

## 2020-08-08 NOTE — Telephone Encounter (Signed)
Patient advised she can wait to have her labs performed at her appointment on 08/28/2020. Noted on appointment note.

## 2020-08-08 NOTE — Telephone Encounter (Signed)
Okay to wait till follow-up visit.

## 2020-08-14 NOTE — Progress Notes (Signed)
Office Visit Note  Patient: April Shepard             Date of Birth: 1941-06-05           MRN: [id number]             PCP: Artis Delay, MD Referring: Artis Delay, MD Visit Date: 08/28/2020 Occupation: _0 @  Subjective:  In-office cimzia injection   History of Present Illness: April Shepard is a 80 y.o. female with history of seronegative rheumatoid arthritis and osteoarthritis.  She is prescribed in-office Cimzia 400 mg injections every 28 days, methotrexate 0.5 mL sq injections every 7 days, and folic acid 1 mg 2 tablets daily.  Patient reports that she has not noticed any increased joint pain or joint swelling since reducing the dose of methotrexate.  She denies any recent rheumatoid arthritis flares.  She has not had any joint pain, joint swelling, morning stiffness, or nocturnal pain.  Patient reports she was recently treated for a UTI.  She has completed the antibiotics without any recurrence of symptoms.  She denies any other recent infections.  She has received 3 moderna covi-19 vaccines.  Patient reports that her gynecologist orders her bone densities.  She has not been taking a calcium or vitamin D supplement recently due to history of hypercalcemia.     Activities of Daily Living:  Patient reports morning stiffness for 0 minutes.   Patient Denies nocturnal pain.  Difficulty dressing/grooming: Denies Difficulty climbing stairs: Denies Difficulty getting out of chair: Denies Difficulty using hands for taps, buttons, cutlery, and/or writing: Denies  Review of Systems  Constitutional: Negative for fatigue.  HENT: Negative for mouth sores, mouth dryness and nose dryness.   Eyes: Negative for pain, itching, visual disturbance and dryness.  Respiratory: Negative for cough, hemoptysis, shortness of breath and difficulty breathing.   Cardiovascular: Negative for chest pain, palpitations and swelling in legs/feet.  Gastrointestinal: Negative for  abdominal pain, blood in stool, constipation and diarrhea.  Endocrine: Negative for increased urination.  Genitourinary: Negative for painful urination.  Musculoskeletal: Negative for arthralgias, joint pain, joint swelling, myalgias, muscle weakness, morning stiffness, muscle tenderness and myalgias.  Skin: Negative for color change, rash and redness.  Allergic/Immunologic: Negative for susceptible to infections.  Neurological: Negative for dizziness, numbness, headaches, memory loss and weakness.  Hematological: Negative for swollen glands.  Psychiatric/Behavioral: Negative for confusion and sleep disturbance.    PMFS History:  Patient Active Problem List   Diagnosis Date Noted  . Dyslipidemia 07/23/2018  . 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 High risk medication use 10/22/2016  . Trigger finger, right ring finger 10/22/2016  . Osteopenia 05/13/2016  . Osteoarthritis of both hands 05/13/2016  . RA (rheumatoid arthritis) ([place]) 05/13/2016    Past Medical History:  Diagnosis Date  . Osteoarthritis of both hands 05/13/2016  . Osteopenia 05/13/2016  . RA (rheumatoid arthritis) ([place]) 05/13/2016    Family History  Problem Relation Age of Onset  . Breast cancer Mother   . Heart attack Father   . Melanoma Brother    Past Surgical History:  Procedure Laterality Date  . CHOLECYSTECTOMY     Social History   Social History Narrative  . Not on file   Immunization History  Administered Date(s) Administered  . Influenza, High Dose Seasonal PF 03/27/2018  . Moderna Sars-Covid-2 Vaccination 08/17/2019, 09/14/2019, 05/08/2020     Objective: Vital Signs: BP 131/72 (BP Location: Right Arm, Patient Position: Sitting, Cuff Size:  Normal)   Pulse 67   Ht 5' 3" (1.6 m)   Wt 126 lb 6.4 oz (57.3 kg)   BMI 22.39 kg/m    Physical Exam Vitals and nursing note reviewed.  Constitutional:      Appearance: She is well-developed.  HENT:     Head:  Normocephalic and atraumatic.  Eyes:     Conjunctiva/sclera: Conjunctivae normal.  Pulmonary:     Effort: Pulmonary effort is normal.  Abdominal:     Palpations: Abdomen is soft.  Musculoskeletal:     Cervical back: Normal range of motion.  Skin:    General: Skin is warm and dry.     Capillary Refill: Capillary refill takes less than 2 seconds.  Neurological:     Mental Status: She is alert and oriented to person, place, and time.  Psychiatric:        Behavior: Behavior normal.       Musculoskeletal Exam: C-spine, thoracic spine, lumbar spine have good range of motion with no discomfort.  Shoulder joints, with joints, wrist joints, MCPs, PIPs, DIPs have good range of motion with no synovitis.  Flexion contracture of the right fourth PIP joint noted.  PIP and DIP thickening consistent with osteoarthritis of both hands.  CMC joint prominence noted bilaterally.  Synovial thickening of the MCP joints but no tenderness or inflammation was noted.  Hip joints have good range of motion with no discomfort.  Knee joints have good range of motion with no warmth or effusion.  Ankle joints have good range of motion with no tenderness or inflammation.  CDAI Exam: CDAI Score: 0.4  Patient Global: 2 mm; Provider Global: 2 mm Swollen: 0 ; Tender: 0  Joint Exam 08/28/2020   No joint exam has been documented for this visit   There is currently no information documented on the homunculus. Go to the Rheumatology activity and complete the homunculus joint exam.  Investigation: No additional findings.  Imaging: No results found.  Recent Labs: Lab Results  Component Value Date   WBC 7.8 04/27/2020   HGB 11.2 (L) 04/27/2020   PLT 359 04/27/2020   NA 137 04/27/2020   K 3.7 04/27/2020   CL 99 04/27/2020   CO2 31 04/27/2020   GLUCOSE 95 04/27/2020   BUN 25 04/27/2020   CREATININE 0.85 04/27/2020   BILITOT 0.6 04/27/2020   ALKPHOS 59 05/14/2016   AST 23 04/27/2020   ALT 25 04/27/2020   PROT  6.0 (L) 04/27/2020   ALBUMIN 4.5 05/14/2016   CALCIUM 9.0 04/27/2020   GFRAA 76 04/27/2020   QFTBGOLDPLUS NEGATIVE 04/27/2020    Speciality Comments: no baseline biologic labs  Procedures:  No procedures performed Allergies: Patient has no known allergies.   Assessment / Plan:     Visit Diagnoses: Rheumatoid arthritis of multiple sites with negative rheumatoid factor ([place]) - She has no joint tenderness or synovitis on exam.  She has not had any recent rheumatoid arthritis flares.  She is clinically doing well on in-office cimzia 400 mg sq injections every 28 days, Methotrexate 0.5 ml sq injections once weekly, and folic acid 2 mg by mouth daily.  She has not missed any doses recently.  She presents today to receive her monthly in office Cimzia injection.  She is not currently experiencing any joint pain, joint swelling, morning stiffness, or nocturnal pain.  She has no difficulty with ADLs.  She will continue on the current treatment regimen.  She was advised to notify us if she develops  signs or symptoms of a flare.  She will follow-up in the office in 5 months plan: Certolizumab Pegol KIT 400 mg  High risk medication use - In-office Cimzia 400 mg sq injection every 28 days, methotrexate 0.5 ml sq injections every 7 days, and folic acid 1 mg 2 tablets daily. CBC and CMP drawn on 04/27/2020.  She is overdue to update lab work.  Orders for CBC and CMP were released.  Her next lab work will be due in June and every 3 months to monitor for drug toxicity.  Standing orders for CBC and CMP remain in place. TB gold negative on 04/27/2020 and will continue to be monitored yearly. - Plan: CBC with Differential/Platelet, COMPLETE METABOLIC PANEL WITH GFR She was recently treated for UTI.  She completed the antibiotics and has not had any recurrence in symptoms.  We discussed the importance of holding methotrexate and Cimzia if she develops signs or symptoms of an infection and to resume once the infection has  completely cleared.  She voiced understanding. She has received 3 moderna covid-19 vaccine doses.  Discussed that ACR and the CDC recommend receiving a fourth vaccine dose 5 to 6 months after the third dose.  She voiced understanding.  She was advised to hold methotrexate 1 week after the vaccine.  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  CMC joint prominence noted bilaterally.  Flexion contracture of the right fourth PIP joint noted.  Complete fist formation bilaterally.  Trigger finger, right ring finger: Resolved   Osteopenia of multiple sites - DEXA is not in Epic.  According to the patient her gynecologist has ordered her bone densities in the past.  She is not currently taking vitamin D or calcium due to history of hypercalcemia.  She has not had any recent falls or fractures.  Other medical conditions are listed as follows:   History of Clostridium difficile infection  Dyslipidemia  Orders: Orders Placed This Encounter  Procedures  . CBC with Differential/Platelet  . COMPLETE METABOLIC PANEL WITH GFR   Meds ordered this encounter  Medications  . Certolizumab Pegol KIT 400 mg  . Tuberculin-Allergy Syringes 27G X 1/2" 1 ML MISC    Sig: Use 1 syringe once weekly to inject methotrexate.    Dispense:  12 each    Refill:  3    Follow-Up Instructions: Return in about 5 months (around 01/28/2021) for Rheumatoid arthritis.   Ofilia Neas, PA-C  Note - This record has been created using Dragon software.  Chart creation errors have been sought, but may not always  have been located. Such creation errors do not reflect on  the standard of medical care.

## 2020-08-18 ENCOUNTER — Ambulatory Visit: Payer: MEDICARE | Admitting: Rheumatology

## 2020-08-28 ENCOUNTER — Encounter: Payer: Self-pay | Admitting: Physician Assistant

## 2020-08-28 ENCOUNTER — Other Ambulatory Visit: Payer: Self-pay

## 2020-08-28 ENCOUNTER — Ambulatory Visit (INDEPENDENT_AMBULATORY_CARE_PROVIDER_SITE_OTHER): Payer: MEDICARE | Admitting: Physician Assistant

## 2020-08-28 VITALS — BP 131/72 | HR 67 | Ht 63.0 in | Wt 126.4 lb

## 2020-08-28 DIAGNOSIS — T372X5A Adverse effect of antimalarials and drugs acting on other blood protozoa, initial encounter: Secondary | ICD-10-CM

## 2020-08-28 DIAGNOSIS — Z8619 Personal history of other infectious and parasitic diseases: Secondary | ICD-10-CM

## 2020-08-28 DIAGNOSIS — M19042 Primary osteoarthritis, left hand: Secondary | ICD-10-CM

## 2020-08-28 DIAGNOSIS — Z79899 Other long term (current) drug therapy: Secondary | ICD-10-CM | POA: Diagnosis not present

## 2020-08-28 DIAGNOSIS — M0609 Rheumatoid arthritis without rheumatoid factor, multiple sites: Secondary | ICD-10-CM | POA: Diagnosis not present

## 2020-08-28 DIAGNOSIS — H35389 Toxic maculopathy, unspecified eye: Secondary | ICD-10-CM | POA: Diagnosis not present

## 2020-08-28 DIAGNOSIS — M19041 Primary osteoarthritis, right hand: Secondary | ICD-10-CM | POA: Diagnosis not present

## 2020-08-28 DIAGNOSIS — M65341 Trigger finger, right ring finger: Secondary | ICD-10-CM

## 2020-08-28 DIAGNOSIS — M8589 Other specified disorders of bone density and structure, multiple sites: Secondary | ICD-10-CM

## 2020-08-28 DIAGNOSIS — E785 Hyperlipidemia, unspecified: Secondary | ICD-10-CM

## 2020-08-28 MED ORDER — CERTOLIZUMAB PEGOL 2 X 200 MG ~~LOC~~ KIT
400.0000 mg | PACK | Freq: Once | SUBCUTANEOUS | Status: AC
  Administered 2020-08-28: 400 mg via SUBCUTANEOUS

## 2020-08-28 MED ORDER — "TUBERCULIN-ALLERGY SYRINGES 27G X 1/2"" 1 ML MISC": 3 refills | Status: DC

## 2020-08-28 NOTE — Progress Notes (Signed)
Pharmacy Note  Subjective:   Patient presents to clinic today to receive monthly dose of Cimzia.  Patient running a fever or have signs/symptoms of infection? No  Patient currently on antibiotics for the treatment of infection? No  Patient have any upcoming invasive procedures/surgeries? No  Objective: CMP     Component Value Date/Time   NA 137 04/27/2020 1032   NA 138 02/20/2016 0000   K 3.7 04/27/2020 1032   CL 99 04/27/2020 1032   CO2 31 04/27/2020 1032   GLUCOSE 95 04/27/2020 1032   BUN 25 04/27/2020 1032   BUN 13 02/20/2016 0000   CREATININE 0.85 04/27/2020 1032   CALCIUM 9.0 04/27/2020 1032   PROT 6.0 (L) 04/27/2020 1032   ALBUMIN 4.5 05/14/2016 1314   AST 23 04/27/2020 1032   ALT 25 04/27/2020 1032   ALKPHOS 59 05/14/2016 1314   BILITOT 0.6 04/27/2020 1032   GFRNONAA 65 04/27/2020 1032   GFRAA 76 04/27/2020 1032    CBC    Component Value Date/Time   WBC 7.8 04/27/2020 1032   RBC 3.48 (L) 04/27/2020 1032   HGB 11.2 (L) 04/27/2020 1032   HCT 33.3 (L) 04/27/2020 1032   PLT 359 04/27/2020 1032   MCV 95.7 04/27/2020 1032   MCH 32.2 04/27/2020 1032   MCHC 33.6 04/27/2020 1032   RDW 12.5 04/27/2020 1032   LYMPHSABS 1,505 04/27/2020 1032   MONOABS 434 05/14/2016 1314   EOSABS 47 04/27/2020 1032   BASOSABS 101 04/27/2020 1032    Baseline Immunosuppressant Therapy Labs TB GOLD Quantiferon TB Gold Latest Ref Rng & Units 04/27/2020  Quantiferon TB Gold Plus NEGATIVE NEGATIVE   Hepatitis Panel Hepatitis Latest Ref Rng & Units 03/05/2018  Hep B Surface Ag NON-REACTI NON-REACTIVE  Hep B IgM NON-REACTI NON-REACTIVE  Hep C Ab NON-REACTI NON-REACTIVE  Hep C Ab NON-REACTI NON-REACTIVE   HIV Lab Results  Component Value Date   HIV NON-REACTIVE 03/05/2018   Immunoglobulins Immunoglobulin Electrophoresis Latest Ref Rng & Units 03/05/2018  IgA  20 - 320 mg/dL 119  IgG 600 - 1,540 mg/dL 844  IgM 50 - 300 mg/dL 156   SPEP Serum Protein Electrophoresis Latest  Ref Rng & Units 04/27/2020  Total Protein 6.1 - 8.1 g/dL 6.0(L)  Albumin 3.8 - 4.8 g/dL -  Alpha-1 0.2 - 0.3 g/dL -  Alpha-2 0.5 - 0.9 g/dL -  Beta Globulin 0.4 - 0.6 g/dL -  Beta 2 0.2 - 0.5 g/dL -  Gamma Globulin 0.8 - 1.7 g/dL -   G6PD Lab Results  Component Value Date   G6PDH 17.9 11/19/2017   TPMT No results found for: TPMT   Chest x-ray: 10/2005  Assessment/Plan:   Administrations This Visit    Certolizumab Pegol KIT 400 mg    Admin Date 08/28/2020 Action Given Dose 400 mg Route Subcutaneous Administered By Earnestine Mealing, CMA         Patient tolerated injection well.  Patient updated labs today in the office. Next appointment for Cimzia injection is scheduled for 09/25/2020 at 1:30pm. Patient is to call the office and reschedule the appointment if running a fever with signs/symptoms of infection, on antibiotics for an active infection of has an upcoming invasive procedure.    All questions encouraged and answered.  Instructed patient to call with any further questions or concerns.

## 2020-08-29 LAB — CBC WITH DIFFERENTIAL/PLATELET
Absolute Monocytes: 540 cells/uL (ref 200–950)
Basophils Absolute: 53 cells/uL (ref 0–200)
Basophils Relative: 0.7 %
Eosinophils Absolute: 68 cells/uL (ref 15–500)
Eosinophils Relative: 0.9 %
HCT: 38.1 % (ref 35.0–45.0)
Hemoglobin: 13.1 g/dL (ref 11.7–15.5)
Lymphs Abs: 1463 cells/uL (ref 850–3900)
MCH: 32 pg (ref 27.0–33.0)
MCHC: 34.4 g/dL (ref 32.0–36.0)
MCV: 93.2 fL (ref 80.0–100.0)
MPV: 8.8 fL (ref 7.5–12.5)
Monocytes Relative: 7.2 %
Neutro Abs: 5378 cells/uL (ref 1500–7800)
Neutrophils Relative %: 71.7 %
Platelets: 282 10*3/uL (ref 140–400)
RBC: 4.09 10*6/uL (ref 3.80–5.10)
RDW: 12.6 % (ref 11.0–15.0)
Total Lymphocyte: 19.5 %
WBC: 7.5 10*3/uL (ref 3.8–10.8)

## 2020-08-29 LAB — COMPLETE METABOLIC PANEL WITH GFR
AG Ratio: 2 (calc) (ref 1.0–2.5)
ALT: 20 U/L (ref 6–29)
AST: 27 U/L (ref 10–35)
Albumin: 4.5 g/dL (ref 3.6–5.1)
Alkaline phosphatase (APISO): 97 U/L (ref 37–153)
BUN: 18 mg/dL (ref 7–25)
CO2: 31 mmol/L (ref 20–32)
Calcium: 9.8 mg/dL (ref 8.6–10.4)
Chloride: 95 mmol/L — ABNORMAL LOW (ref 98–110)
Creat: 0.71 mg/dL (ref 0.60–0.88)
GFR, Est African American: 93 mL/min/{1.73_m2} (ref >=60)
GFR, Est Non African American: 80 mL/min/{1.73_m2} (ref >=60)
Globulin: 2.2 g/dL (calc) (ref 1.9–3.7)
Glucose, Bld: 78 mg/dL (ref 65–99)
Potassium: 3.5 mmol/L (ref 3.5–5.3)
Sodium: 134 mmol/L — ABNORMAL LOW (ref 135–146)
Total Bilirubin: 0.4 mg/dL (ref 0.2–1.2)
Total Protein: 6.7 g/dL (ref 6.1–8.1)

## 2020-08-29 NOTE — Progress Notes (Signed)
Sodium and chloride are borderline low. Rest of CMP WNL. CBC WNL.

## 2020-09-25 ENCOUNTER — Other Ambulatory Visit: Payer: Self-pay

## 2020-09-25 ENCOUNTER — Ambulatory Visit (INDEPENDENT_AMBULATORY_CARE_PROVIDER_SITE_OTHER): Payer: MEDICARE

## 2020-09-25 VITALS — BP 129/76 | HR 68

## 2020-09-25 DIAGNOSIS — M0609 Rheumatoid arthritis without rheumatoid factor, multiple sites: Secondary | ICD-10-CM

## 2020-09-25 MED ORDER — CERTOLIZUMAB PEGOL 2 X 200 MG ~~LOC~~ KIT
400.0000 mg | PACK | Freq: Once | SUBCUTANEOUS | Status: AC
  Administered 2020-09-25: 400 mg via SUBCUTANEOUS

## 2020-09-25 NOTE — Progress Notes (Signed)
Pharmacy Note  Subjective:   Patient presents to clinic today to receive monthly dose of Cimzia.  Patient running a fever or have signs/symptoms of infection? No  Patient currently on antibiotics for the treatment of infection? No  Patient have any upcoming invasive procedures/surgeries? No  Objective: CMP     Component Value Date/Time   NA 134 (L) 08/28/2020 1212   NA 138 02/20/2016 0000   K 3.5 08/28/2020 1212   CL 95 (L) 08/28/2020 1212   CO2 31 08/28/2020 1212   GLUCOSE 78 08/28/2020 1212   BUN 18 08/28/2020 1212   BUN 13 02/20/2016 0000   CREATININE 0.71 08/28/2020 1212   CALCIUM 9.8 08/28/2020 1212   PROT 6.7 08/28/2020 1212   ALBUMIN 4.5 05/14/2016 1314   AST 27 08/28/2020 1212   ALT 20 08/28/2020 1212   ALKPHOS 59 05/14/2016 1314   BILITOT 0.4 08/28/2020 1212   GFRNONAA 80 08/28/2020 1212   GFRAA 93 08/28/2020 1212    CBC    Component Value Date/Time   WBC 7.5 08/28/2020 1212   RBC 4.09 08/28/2020 1212   HGB 13.1 08/28/2020 1212   HCT 38.1 08/28/2020 1212   PLT 282 08/28/2020 1212   MCV 93.2 08/28/2020 1212   MCH 32.0 08/28/2020 1212   MCHC 34.4 08/28/2020 1212   RDW 12.6 08/28/2020 1212   LYMPHSABS 1,463 08/28/2020 1212   MONOABS 434 05/14/2016 1314   EOSABS 68 08/28/2020 1212   BASOSABS 53 08/28/2020 1212    Baseline Immunosuppressant Therapy Labs TB GOLD Quantiferon TB Gold Latest Ref Rng & Units 04/27/2020  Quantiferon TB Gold Plus NEGATIVE NEGATIVE   Hepatitis Panel Hepatitis Latest Ref Rng & Units 03/05/2018  Hep B Surface Ag NON-REACTI NON-REACTIVE  Hep B IgM NON-REACTI NON-REACTIVE  Hep C Ab NON-REACTI NON-REACTIVE  Hep C Ab NON-REACTI NON-REACTIVE   HIV Lab Results  Component Value Date   HIV NON-REACTIVE 03/05/2018   Immunoglobulins Immunoglobulin Electrophoresis Latest Ref Rng & Units 03/05/2018  IgA  20 - 320 mg/dL 119  IgG 600 - 1,540 mg/dL 844  IgM 50 - 300 mg/dL 156   SPEP Serum Protein Electrophoresis Latest Ref Rng &  Units 08/28/2020  Total Protein 6.1 - 8.1 g/dL 6.7  Albumin 3.8 - 4.8 g/dL -  Alpha-1 0.2 - 0.3 g/dL -  Alpha-2 0.5 - 0.9 g/dL -  Beta Globulin 0.4 - 0.6 g/dL -  Beta 2 0.2 - 0.5 g/dL -  Gamma Globulin 0.8 - 1.7 g/dL -   G6PD Lab Results  Component Value Date   G6PDH 17.9 11/19/2017   TPMT No results found for: TPMT   Chest x-ray: 10/2005  Assessment/Plan:  Administrations This Visit    Certolizumab Pegol KIT 400 mg    Admin Date 09/25/2020 Action Given Dose 400 mg Route Subcutaneous Administered By Earnestine Mealing, CMA         Patient tolerated injection well.  Labs were updated on 08/28/2020. Next appointment for Cimzia injection is scheduled for 10/23/2020. Patient is to call the office and reschedule the appointment if running a fever with signs/symptoms of infection, on antibiotics for an active infection of has an upcoming invasive procedure.    All questions encouraged and answered.  Instructed patient to call with any further questions or concerns.

## 2020-10-23 ENCOUNTER — Ambulatory Visit (INDEPENDENT_AMBULATORY_CARE_PROVIDER_SITE_OTHER): Payer: MEDICARE

## 2020-10-23 ENCOUNTER — Other Ambulatory Visit: Payer: Self-pay

## 2020-10-23 ENCOUNTER — Telehealth: Payer: Self-pay

## 2020-10-23 ENCOUNTER — Other Ambulatory Visit: Payer: Self-pay | Admitting: Physician Assistant

## 2020-10-23 VITALS — BP 153/74 | HR 69

## 2020-10-23 DIAGNOSIS — M0609 Rheumatoid arthritis without rheumatoid factor, multiple sites: Secondary | ICD-10-CM | POA: Diagnosis not present

## 2020-10-23 MED ORDER — CERTOLIZUMAB PEGOL 2 X 200 MG ~~LOC~~ KIT
400.0000 mg | PACK | Freq: Once | SUBCUTANEOUS | Status: AC
  Administered 2020-10-23: 400 mg via SUBCUTANEOUS

## 2020-10-23 NOTE — Progress Notes (Signed)
Pharmacy Note  Subjective:   Patient presents to clinic today to receive monthly dose of Cimzia.  Patient running a fever or have signs/symptoms of infection? No  Patient currently on antibiotics for the treatment of infection? No  Patient have any upcoming invasive procedures/surgeries? No  Objective: CMP     Component Value Date/Time   NA 134 (L) 08/28/2020 1212   NA 138 02/20/2016 0000   K 3.5 08/28/2020 1212   CL 95 (L) 08/28/2020 1212   CO2 31 08/28/2020 1212   GLUCOSE 78 08/28/2020 1212   BUN 18 08/28/2020 1212   BUN 13 02/20/2016 0000   CREATININE 0.71 08/28/2020 1212   CALCIUM 9.8 08/28/2020 1212   PROT 6.7 08/28/2020 1212   ALBUMIN 4.5 05/14/2016 1314   AST 27 08/28/2020 1212   ALT 20 08/28/2020 1212   ALKPHOS 59 05/14/2016 1314   BILITOT 0.4 08/28/2020 1212   GFRNONAA 80 08/28/2020 1212   GFRAA 93 08/28/2020 1212    CBC    Component Value Date/Time   WBC 7.5 08/28/2020 1212   RBC 4.09 08/28/2020 1212   HGB 13.1 08/28/2020 1212   HCT 38.1 08/28/2020 1212   PLT 282 08/28/2020 1212   MCV 93.2 08/28/2020 1212   MCH 32.0 08/28/2020 1212   MCHC 34.4 08/28/2020 1212   RDW 12.6 08/28/2020 1212   LYMPHSABS 1,463 08/28/2020 1212   MONOABS 434 05/14/2016 1314   EOSABS 68 08/28/2020 1212   BASOSABS 53 08/28/2020 1212    Baseline Immunosuppressant Therapy Labs TB GOLD Quantiferon TB Gold Latest Ref Rng & Units 04/27/2020  Quantiferon TB Gold Plus NEGATIVE NEGATIVE   Hepatitis Panel Hepatitis Latest Ref Rng & Units 03/05/2018  Hep B Surface Ag NON-REACTI NON-REACTIVE  Hep B IgM NON-REACTI NON-REACTIVE  Hep C Ab NON-REACTI NON-REACTIVE  Hep C Ab NON-REACTI NON-REACTIVE   HIV Lab Results  Component Value Date   HIV NON-REACTIVE 03/05/2018   Immunoglobulins Immunoglobulin Electrophoresis Latest Ref Rng & Units 03/05/2018  IgA  20 - 320 mg/dL 119  IgG 600 - 1,540 mg/dL 844  IgM 50 - 300 mg/dL 156   SPEP Serum Protein Electrophoresis Latest Ref Rng &  Units 08/28/2020  Total Protein 6.1 - 8.1 g/dL 6.7  Albumin 3.8 - 4.8 g/dL -  Alpha-1 0.2 - 0.3 g/dL -  Alpha-2 0.5 - 0.9 g/dL -  Beta Globulin 0.4 - 0.6 g/dL -  Beta 2 0.2 - 0.5 g/dL -  Gamma Globulin 0.8 - 1.7 g/dL -   G6PD Lab Results  Component Value Date   G6PDH 17.9 11/19/2017   TPMT No results found for: TPMT   Chest x-ray: 10/2005  Assessment/Plan:    Administrations This Visit    Certolizumab Pegol KIT 400 mg    Admin Date 10/23/2020 Action Given Dose 400 mg Route Subcutaneous Administered By Earnestine Mealing, CMA          Patient tolerated injection well.  Labs were updated on 08/28/2020. Next appointment for Cimzia injection is scheduled for 11/20/2020. Patient is to call the office and reschedule the appointment if running a fever with signs/symptoms of infection, on antibiotics for an active infection of has an upcoming invasive procedure.   All questions encouraged and answered. Instructed patient to call with any further questions or concerns.

## 2020-10-23 NOTE — Telephone Encounter (Signed)
Patient was seen in the office today for monthly Cimzia injections. Patient asked what type of topical medication she can use for the "age spots" on her abdomen. They do not itch or hurt, patient simply does not like the appearance of them. I advised patient she would need to contact her dermatologist for recommendations. Patient states her dermatologist has evaluated them and patient does not want to proceed with treatment advised (laser procedure). Patient has seen a commercial on TV about a topical cream and would like to know your recommendations. I again advised patient we do not treat that and she should contact her dermatologist.

## 2020-10-24 NOTE — Telephone Encounter (Signed)
Next Visit: 01/30/2021  Last Visit: 08/28/2020  Last Fill: 07/27/2020  DX: Rheumatoid arthritis of multiple sites with negative rheumatoid factor   Current Dose per office note 08/28/2020, methotrexate 0.5 ml sq injections every 7 days  Labs: 08/28/2020, Sodium and chloride are borderline low. Rest of CMP WNL. CBC WNL  Okay to refill MTX?

## 2020-10-24 NOTE — Telephone Encounter (Signed)
I called patient and discussed that liquid nitrogen treatment by dermatologist is used for the treatment of age spots.  She wanted to know if there are any other options.  I told her that I am not aware of any other treatment which can be used topically although she may ask her dermatologist.

## 2020-11-20 ENCOUNTER — Ambulatory Visit (INDEPENDENT_AMBULATORY_CARE_PROVIDER_SITE_OTHER): Payer: MEDICARE | Admitting: *Deleted

## 2020-11-20 ENCOUNTER — Other Ambulatory Visit: Payer: Self-pay

## 2020-11-20 VITALS — BP 146/76 | HR 66

## 2020-11-20 DIAGNOSIS — M0609 Rheumatoid arthritis without rheumatoid factor, multiple sites: Secondary | ICD-10-CM | POA: Diagnosis not present

## 2020-11-20 DIAGNOSIS — Z79899 Other long term (current) drug therapy: Secondary | ICD-10-CM

## 2020-11-20 MED ORDER — CERTOLIZUMAB PEGOL 2 X 200 MG ~~LOC~~ KIT
400.0000 mg | PACK | Freq: Once | SUBCUTANEOUS | Status: AC
  Administered 2020-11-20: 400 mg via SUBCUTANEOUS

## 2020-11-20 NOTE — Progress Notes (Signed)
Pharmacy Note  Subjective:   Patient presents to clinic today to receive monthly dose of Cimzia.  Patient running a fever or have signs/symptoms of infection? No  Patient currently on antibiotics for the treatment of infection? No  Patient have any upcoming invasive procedures/surgeries? No  Objective: CMP     Component Value Date/Time   NA 134 (L) 08/28/2020 1212   NA 138 02/20/2016 0000   K 3.5 08/28/2020 1212   CL 95 (L) 08/28/2020 1212   CO2 31 08/28/2020 1212   GLUCOSE 78 08/28/2020 1212   BUN 18 08/28/2020 1212   BUN 13 02/20/2016 0000   CREATININE 0.71 08/28/2020 1212   CALCIUM 9.8 08/28/2020 1212   PROT 6.7 08/28/2020 1212   ALBUMIN 4.5 05/14/2016 1314   AST 27 08/28/2020 1212   ALT 20 08/28/2020 1212   ALKPHOS 59 05/14/2016 1314   BILITOT 0.4 08/28/2020 1212   GFRNONAA 80 08/28/2020 1212   GFRAA 93 08/28/2020 1212    CBC    Component Value Date/Time   WBC 7.5 08/28/2020 1212   RBC 4.09 08/28/2020 1212   HGB 13.1 08/28/2020 1212   HCT 38.1 08/28/2020 1212   PLT 282 08/28/2020 1212   MCV 93.2 08/28/2020 1212   MCH 32.0 08/28/2020 1212   MCHC 34.4 08/28/2020 1212   RDW 12.6 08/28/2020 1212   LYMPHSABS 1,463 08/28/2020 1212   MONOABS 434 05/14/2016 1314   EOSABS 68 08/28/2020 1212   BASOSABS 53 08/28/2020 1212    Baseline Immunosuppressant Therapy Labs TB GOLD Quantiferon TB Gold Latest Ref Rng & Units 04/27/2020  Quantiferon TB Gold Plus NEGATIVE NEGATIVE   Hepatitis Panel Hepatitis Latest Ref Rng & Units 03/05/2018  Hep B Surface Ag NON-REACTI NON-REACTIVE  Hep B IgM NON-REACTI NON-REACTIVE  Hep C Ab NON-REACTI NON-REACTIVE  Hep C Ab NON-REACTI NON-REACTIVE   HIV Lab Results  Component Value Date   HIV NON-REACTIVE 03/05/2018   Immunoglobulins Immunoglobulin Electrophoresis Latest Ref Rng & Units 03/05/2018  IgA  20 - 320 mg/dL 119  IgG 600 - 1,540 mg/dL 844  IgM 50 - 300 mg/dL 156   SPEP Serum Protein Electrophoresis Latest Ref Rng &  Units 08/28/2020  Total Protein 6.1 - 8.1 g/dL 6.7  Albumin 3.8 - 4.8 g/dL -  Alpha-1 0.2 - 0.3 g/dL -  Alpha-2 0.5 - 0.9 g/dL -  Beta Globulin 0.4 - 0.6 g/dL -  Beta 2 0.2 - 0.5 g/dL -  Gamma Globulin 0.8 - 1.7 g/dL -   G6PD Lab Results  Component Value Date   G6PDH 17.9 11/19/2017   TPMT No results found for: TPMT   Chest x-ray: 10/2005  Assessment/Plan:   Administrations This Visit    Certolizumab Pegol KIT 400 mg    Admin Date 11/20/2020 Action Given Dose 400 mg Route Subcutaneous Administered By ,  L, LPN          Patient tolerated injection well.   Appointment for next injection scheduled for 12/19/2020.  Patient due for labs today and in 3 months.  Patient is to call and reschedule appointment if running a fever with signs/symptoms of infection, on antibiotics for active infection or has an upcoming invasive procedure.  All questions encouraged and answered.  Instructed patient to call with any further questions or concerns.  

## 2020-11-21 LAB — CBC WITH DIFFERENTIAL/PLATELET
Absolute Monocytes: 292 cells/uL (ref 200–950)
Basophils Absolute: 61 cells/uL (ref 0–200)
Basophils Relative: 1.1 %
Eosinophils Absolute: 39 cells/uL (ref 15–500)
Eosinophils Relative: 0.7 %
HCT: 37.4 % (ref 35.0–45.0)
Hemoglobin: 12.5 g/dL (ref 11.7–15.5)
Lymphs Abs: 1881 cells/uL (ref 850–3900)
MCH: 31.3 pg (ref 27.0–33.0)
MCHC: 33.4 g/dL (ref 32.0–36.0)
MCV: 93.7 fL (ref 80.0–100.0)
MPV: 8.7 fL (ref 7.5–12.5)
Monocytes Relative: 5.3 %
Neutro Abs: 3229 cells/uL (ref 1500–7800)
Neutrophils Relative %: 58.7 %
Platelets: 261 10*3/uL (ref 140–400)
RBC: 3.99 10*6/uL (ref 3.80–5.10)
RDW: 13 % (ref 11.0–15.0)
Total Lymphocyte: 34.2 %
WBC: 5.5 10*3/uL (ref 3.8–10.8)

## 2020-11-21 LAB — COMPLETE METABOLIC PANEL WITH GFR
AG Ratio: 2.3 (calc) (ref 1.0–2.5)
ALT: 19 U/L (ref 6–29)
AST: 27 U/L (ref 10–35)
Albumin: 4.5 g/dL (ref 3.6–5.1)
Alkaline phosphatase (APISO): 92 U/L (ref 37–153)
BUN: 14 mg/dL (ref 7–25)
CO2: 29 mmol/L (ref 20–32)
Calcium: 9.8 mg/dL (ref 8.6–10.4)
Chloride: 98 mmol/L (ref 98–110)
Creat: 0.78 mg/dL (ref 0.60–0.88)
GFR, Est African American: 83 mL/min/{1.73_m2} (ref >=60)
GFR, Est Non African American: 72 mL/min/{1.73_m2} (ref >=60)
Globulin: 2 g/dL (calc) (ref 1.9–3.7)
Glucose, Bld: 88 mg/dL (ref 65–99)
Potassium: 4 mmol/L (ref 3.5–5.3)
Sodium: 135 mmol/L (ref 135–146)
Total Bilirubin: 0.6 mg/dL (ref 0.2–1.2)
Total Protein: 6.5 g/dL (ref 6.1–8.1)

## 2020-12-19 ENCOUNTER — Other Ambulatory Visit: Payer: Self-pay

## 2020-12-19 ENCOUNTER — Ambulatory Visit (INDEPENDENT_AMBULATORY_CARE_PROVIDER_SITE_OTHER): Payer: MEDICARE | Admitting: *Deleted

## 2020-12-19 VITALS — BP 122/76 | HR 69

## 2020-12-19 DIAGNOSIS — M0609 Rheumatoid arthritis without rheumatoid factor, multiple sites: Secondary | ICD-10-CM | POA: Diagnosis not present

## 2020-12-19 MED ORDER — CERTOLIZUMAB PEGOL 2 X 200 MG ~~LOC~~ KIT
400.0000 mg | PACK | Freq: Once | SUBCUTANEOUS | Status: AC
  Administered 2020-12-19: 400 mg via SUBCUTANEOUS

## 2020-12-19 NOTE — Progress Notes (Signed)
Pharmacy Note  Subjective:   Patient presents to clinic today to receive monthly dose of Cimzia.  Patient running a fever or have signs/symptoms of infection? No  Patient currently on antibiotics for the treatment of infection? No  Patient have any upcoming invasive procedures/surgeries? No  Objective: CMP     Component Value Date/Time   NA 135 11/20/2020 1401   NA 138 02/20/2016 0000   K 4.0 11/20/2020 1401   CL 98 11/20/2020 1401   CO2 29 11/20/2020 1401   GLUCOSE 88 11/20/2020 1401   BUN 14 11/20/2020 1401   BUN 13 02/20/2016 0000   CREATININE 0.78 11/20/2020 1401   CALCIUM 9.8 11/20/2020 1401   PROT 6.5 11/20/2020 1401   ALBUMIN 4.5 05/14/2016 1314   AST 27 11/20/2020 1401   ALT 19 11/20/2020 1401   ALKPHOS 59 05/14/2016 1314   BILITOT 0.6 11/20/2020 1401   GFRNONAA 72 11/20/2020 1401   GFRAA 83 11/20/2020 1401    CBC    Component Value Date/Time   WBC 5.5 11/20/2020 1401   RBC 3.99 11/20/2020 1401   HGB 12.5 11/20/2020 1401   HCT 37.4 11/20/2020 1401   PLT 261 11/20/2020 1401   MCV 93.7 11/20/2020 1401   MCH 31.3 11/20/2020 1401   MCHC 33.4 11/20/2020 1401   RDW 13.0 11/20/2020 1401   LYMPHSABS 1,881 11/20/2020 1401   MONOABS 434 05/14/2016 1314   EOSABS 39 11/20/2020 1401   BASOSABS 61 11/20/2020 1401    Baseline Immunosuppressant Therapy Labs TB GOLD Quantiferon TB Gold Latest Ref Rng & Units 04/27/2020  Quantiferon TB Gold Plus NEGATIVE NEGATIVE   Hepatitis Panel Hepatitis Latest Ref Rng & Units 03/05/2018  Hep B Surface Ag NON-REACTI NON-REACTIVE  Hep B IgM NON-REACTI NON-REACTIVE  Hep C Ab NON-REACTI NON-REACTIVE  Hep C Ab NON-REACTI NON-REACTIVE   HIV Lab Results  Component Value Date   HIV NON-REACTIVE 03/05/2018   Immunoglobulins Immunoglobulin Electrophoresis Latest Ref Rng & Units 03/05/2018  IgA  20 - 320 mg/dL 119  IgG 600 - 1,540 mg/dL 844  IgM 50 - 300 mg/dL 156   SPEP Serum Protein Electrophoresis Latest Ref Rng & Units  11/20/2020  Total Protein 6.1 - 8.1 g/dL 6.5  Albumin 3.8 - 4.8 g/dL -  Alpha-1 0.2 - 0.3 g/dL -  Alpha-2 0.5 - 0.9 g/dL -  Beta Globulin 0.4 - 0.6 g/dL -  Beta 2 0.2 - 0.5 g/dL -  Gamma Globulin 0.8 - 1.7 g/dL -   G6PD Lab Results  Component Value Date   G6PDH 17.9 11/19/2017   TPMT No results found for: TPMT   Chest x-ray: 10/2005  Assessment/Plan:   Administrations This Visit     Certolizumab Pegol KIT 400 mg     Admin Date 12/19/2020 Action Given Dose 400 mg Route Subcutaneous Administered By Carole Binning, LPN             Patient tolerated injection well.   Appointment for next injection scheduled for 01/16/2021.  Patient due for labs in September 2022.  Patient is to call and reschedule appointment if running a fever with signs/symptoms of infection, on antibiotics for active infection or has an upcoming invasive procedure.  All questions encouraged and answered.  Instructed patient to call with any further questions or concerns.

## 2021-01-16 ENCOUNTER — Other Ambulatory Visit: Payer: Self-pay

## 2021-01-16 ENCOUNTER — Ambulatory Visit (INDEPENDENT_AMBULATORY_CARE_PROVIDER_SITE_OTHER): Payer: MEDICARE | Admitting: *Deleted

## 2021-01-16 VITALS — BP 130/77 | HR 74

## 2021-01-16 DIAGNOSIS — M0609 Rheumatoid arthritis without rheumatoid factor, multiple sites: Secondary | ICD-10-CM

## 2021-01-16 MED ORDER — CERTOLIZUMAB PEGOL 2 X 200 MG ~~LOC~~ KIT
400.0000 mg | PACK | Freq: Once | SUBCUTANEOUS | Status: AC
  Administered 2021-01-16: 400 mg via SUBCUTANEOUS

## 2021-01-16 NOTE — Progress Notes (Signed)
Pharmacy Note  Subjective:   Patient presents to clinic today to receive monthly dose of Cimzia.  Patient running a fever or have signs/symptoms of infection? No  Patient currently on antibiotics for the treatment of infection? No  Patient have any upcoming invasive procedures/surgeries? No  Objective: CMP     Component Value Date/Time   NA 135 11/20/2020 1401   NA 138 02/20/2016 0000   K 4.0 11/20/2020 1401   CL 98 11/20/2020 1401   CO2 29 11/20/2020 1401   GLUCOSE 88 11/20/2020 1401   BUN 14 11/20/2020 1401   BUN 13 02/20/2016 0000   CREATININE 0.78 11/20/2020 1401   CALCIUM 9.8 11/20/2020 1401   PROT 6.5 11/20/2020 1401   ALBUMIN 4.5 05/14/2016 1314   AST 27 11/20/2020 1401   ALT 19 11/20/2020 1401   ALKPHOS 59 05/14/2016 1314   BILITOT 0.6 11/20/2020 1401   GFRNONAA 72 11/20/2020 1401   GFRAA 83 11/20/2020 1401    CBC    Component Value Date/Time   WBC 5.5 11/20/2020 1401   RBC 3.99 11/20/2020 1401   HGB 12.5 11/20/2020 1401   HCT 37.4 11/20/2020 1401   PLT 261 11/20/2020 1401   MCV 93.7 11/20/2020 1401   MCH 31.3 11/20/2020 1401   MCHC 33.4 11/20/2020 1401   RDW 13.0 11/20/2020 1401   LYMPHSABS 1,881 11/20/2020 1401   MONOABS 434 05/14/2016 1314   EOSABS 39 11/20/2020 1401   BASOSABS 61 11/20/2020 1401    Baseline Immunosuppressant Therapy Labs TB GOLD Quantiferon TB Gold Latest Ref Rng & Units 04/27/2020  Quantiferon TB Gold Plus NEGATIVE NEGATIVE   Hepatitis Panel Hepatitis Latest Ref Rng & Units 03/05/2018  Hep B Surface Ag NON-REACTI NON-REACTIVE  Hep B IgM NON-REACTI NON-REACTIVE  Hep C Ab NON-REACTI NON-REACTIVE  Hep C Ab NON-REACTI NON-REACTIVE   HIV Lab Results  Component Value Date   HIV NON-REACTIVE 03/05/2018   Immunoglobulins Immunoglobulin Electrophoresis Latest Ref Rng & Units 03/05/2018  IgA  20 - 320 mg/dL 119  IgG 600 - 1,540 mg/dL 844  IgM 50 - 300 mg/dL 156   SPEP Serum Protein Electrophoresis Latest Ref Rng & Units  11/20/2020  Total Protein 6.1 - 8.1 g/dL 6.5  Albumin 3.8 - 4.8 g/dL -  Alpha-1 0.2 - 0.3 g/dL -  Alpha-2 0.5 - 0.9 g/dL -  Beta Globulin 0.4 - 0.6 g/dL -  Beta 2 0.2 - 0.5 g/dL -  Gamma Globulin 0.8 - 1.7 g/dL -   G6PD Lab Results  Component Value Date   G6PDH 17.9 11/19/2017   TPMT No results found for: TPMT   Chest x-ray:  10/2005  Assessment/Plan:   Administrations This Visit     Certolizumab Pegol KIT 400 mg     Admin Date 01/16/2021 Action Given Dose 400 mg Route Subcutaneous Administered By Carole Binning, LPN             Patient tolerated injection well.   Appointment for next injection scheduled for 02/13/2021.  Patient due for labs in September 2022.  Patient is to call and reschedule appointment if running a fever with signs/symptoms of infection, on antibiotics for active infection or has an upcoming invasive procedure.  All questions encouraged and answered.  Instructed patient to call with any further questions or concerns.

## 2021-01-16 NOTE — Progress Notes (Deleted)
Office Visit Note  Patient: April Shepard             Date of Birth: 1941/03/06           MRN: [id number]             PCP: Colbert Coyer, MD Referring: Colbert Coyer, MD Visit Date: 01/30/2021 Occupation: @GUAROCC @  Subjective:  No chief complaint on file.   History of Present Illness: April Shepard is a 80 y.o. female ***   Activities of Daily Living:  Patient reports morning stiffness for *** {minute/hour:19697}.   Patient {ACTIONS;DENIES/REPORTS:21021675::"Denies"} nocturnal pain.  Difficulty dressing/grooming: {ACTIONS;DENIES/REPORTS:21021675::"Denies"} Difficulty climbing stairs: {ACTIONS;DENIES/REPORTS:21021675::"Denies"} Difficulty getting out of chair: {ACTIONS;DENIES/REPORTS:21021675::"Denies"} Difficulty using hands for taps, buttons, cutlery, and/or writing: {ACTIONS;DENIES/REPORTS:21021675::"Denies"}  No Rheumatology ROS completed.   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   High risk medication use 10/22/2016   Trigger finger, right ring finger 10/22/2016   Osteopenia 05/13/2016   Osteoarthritis of both hands 05/13/2016   RA (rheumatoid arthritis) ([facility]) 05/13/2016    Past Medical History:  Diagnosis Date   Osteoarthritis of both hands 05/13/2016   Osteopenia 05/13/2016   RA (rheumatoid arthritis) (HCC) 05/13/2016    Family History  Problem Relation Age of Onset   Breast cancer Mother    Heart attack Father    Melanoma Brother    Past Surgical History:  Procedure Laterality Date   CHOLECYSTECTOMY     Social History   Social History Narrative   Not on file   Immunization History  Administered Date(s) Administered   Influenza, High Dose Seasonal PF 03/27/2018   Moderna Sars-Covid-2 Vaccination 08/17/2019, 09/14/2019, 05/08/2020     Objective: Vital Signs: There were no vitals taken for this visit.    Physical Exam   Musculoskeletal Exam: ***  CDAI Exam: CDAI Score: -- Patient Global: --; Provider Global: -- Swollen: --; Tender: -- Joint Exam 01/30/2021   No joint exam has been documented for this visit   There is currently no information documented on the homunculus. Go to the Rheumatology activity and complete the homunculus joint exam.  Investigation: No additional findings.  Imaging: No results found.  Recent Labs: Lab Results  Component Value Date   WBC 5.5 11/20/2020   HGB 12.5 11/20/2020   PLT 261 11/20/2020   NA 135 11/20/2020   K 4.0 11/20/2020   CL 98 11/20/2020   CO2 29 11/20/2020   GLUCOSE 88 11/20/2020   BUN 14 11/20/2020   CREATININE 0.78 11/20/2020   BILITOT 0.6 11/20/2020   ALKPHOS 59 05/14/2016   AST 27 11/20/2020   ALT 19 11/20/2020   PROT 6.5 11/20/2020   ALBUMIN 4.5 05/14/2016   CALCIUM 9.8 11/20/2020   GFRAA 83 11/20/2020   QFTBGOLDPLUS NEGATIVE 04/27/2020    Speciality Comments: no baseline biologic labs  Procedures:  No procedures performed Allergies: Patient has no known allergies.   Assessment / Plan:     Visit Diagnoses: No diagnosis found.  Orders: No orders of the defined types were placed in this encounter.  No orders of the defined types were placed in this encounter.   Face-to-face time spent with patient was *** minutes. Greater than 50% of time was spent in counseling and coordination of care.  Follow-Up Instructions: No follow-ups on file.   13/04/2020, CMA  Note - This record has been created using  Editor, commissioning.  Chart creation errors have been sought, but may not always  have been located. Such creation errors do not reflect on  the standard of medical care.

## 2021-01-17 ENCOUNTER — Other Ambulatory Visit: Payer: Self-pay | Admitting: Physician Assistant

## 2021-01-18 NOTE — Telephone Encounter (Signed)
Next Visit: 02/13/2021  Last Visit: 08/28/2020  Last Fill: 10/24/2020  DX: Rheumatoid arthritis of multiple sites with negative rheumatoid factor  Current Dose per office note 08/28/2020: methotrexate 0.5 ml sq injections every 7 days  Labs: 11/20/2020 CBC and CMP WNL  Okay to refill MTX?

## 2021-01-30 ENCOUNTER — Ambulatory Visit: Payer: MEDICARE | Admitting: Rheumatology

## 2021-01-30 NOTE — Progress Notes (Signed)
Office Visit Note  Patient: April Shepard             Date of Birth: 04-03-1941           MRN: [id number]             PCP: Artis Delay, MD Referring: Artis Delay, MD Visit Date: 02/13/2021 Occupation: '@GUAROCC' @  Subjective:  Other (Swelling in left foot )   History of Present Illness: April Shepard is a 80 y.o. female with a history of rheumatoid arthritis.  She states last Saturday she woke up with swelling in her left foot.  She has some discomfort in her foot.  She does not recall having any injury.  None of the other joints are painful.  She had came to get her Cimzia injection today.  She has been taking methotrexate 0.5 mL subcu weekly.  Activities of Daily Living:  Patient reports morning stiffness for 0 minutes.   Patient Denies nocturnal pain.  Difficulty dressing/grooming: Denies Difficulty climbing stairs: Denies Difficulty getting out of chair: Denies Difficulty using hands for taps, buttons, cutlery, and/or writing: Denies  Review of Systems  Constitutional:  Negative for fatigue.  HENT:  Negative for mouth sores, mouth dryness and nose dryness.   Eyes:  Negative for pain, itching and dryness.  Respiratory:  Negative for shortness of breath and difficulty breathing.   Cardiovascular:  Negative for chest pain and palpitations.  Gastrointestinal:  Negative for blood in stool, constipation and diarrhea.  Endocrine: Negative for increased urination.  Genitourinary:  Negative for difficulty urinating.  Musculoskeletal:  Positive for joint swelling. Negative for joint pain, joint pain, myalgias, morning stiffness, muscle tenderness and myalgias.  Skin:  Negative for color change, rash and redness.  Allergic/Immunologic: Negative for susceptible to infections.  Neurological:  Negative for dizziness, numbness, headaches, memory loss and weakness.  Hematological:  Negative for bruising/bleeding tendency.  Psychiatric/Behavioral:  Negative for  confusion.    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   High risk medication use 10/22/2016   Trigger finger, right ring finger 10/22/2016   Osteopenia 05/13/2016   Osteoarthritis of both hands 05/13/2016   RA (rheumatoid arthritis) ([place]) 05/13/2016    Past Medical History:  Diagnosis Date   Osteoarthritis of both hands 05/13/2016   Osteopenia 05/13/2016   RA (rheumatoid arthritis) ([place]) 05/13/2016    Family History  Problem Relation Age of Onset   Breast cancer Mother    Heart attack Father    Melanoma Brother    Past Surgical History:  Procedure Laterality Date   CHOLECYSTECTOMY     Social History   Social History Narrative   Not on file   Immunization History  Administered Date(s) Administered   Influenza, High Dose Seasonal PF 03/27/2018   Moderna Sars-Covid-2 Vaccination 08/17/2019, 09/14/2019, 05/08/2020     Objective: Vital Signs: BP 134/73 (BP Location: Left Arm, Patient Position: Sitting, Cuff Size: Normal)   Pulse 83   Ht '5\' 3"'  (1.6 m)   Wt 124 lb 12.8 oz (56.6 kg)   BMI 22.11 kg/m    Physical Exam Vitals and nursing note reviewed.  Constitutional:      Appearance: She is well-developed.  HENT:     Head: Normocephalic and atraumatic.  Eyes:     Conjunctiva/sclera: Conjunctivae normal.  Cardiovascular:     Rate and Rhythm: Normal rate and regular  rhythm.     Heart sounds: Normal heart sounds.  Pulmonary:     Effort: Pulmonary effort is normal.     Breath sounds: Normal breath sounds.  Abdominal:     General: Bowel sounds are normal.     Palpations: Abdomen is soft.  Musculoskeletal:     Cervical back: Normal range of motion.  Lymphadenopathy:     Cervical: No cervical adenopathy.  Skin:    General: Skin is warm and dry.     Capillary Refill: Capillary refill takes less than 2 seconds.  Neurological:     Mental Status:  She is alert and oriented to person, place, and time.  Psychiatric:        Behavior: Behavior normal.     Musculoskeletal Exam: C-spine was in good range of motion.  Shoulder joints, elbow joints, wrist joints were in good range of motion.  She had bilateral PIP and DIP thickening no synovitis was noted.  Hip joints and knee joints with good range of motion.  She had some discomfort in her left knee joint.  Warmth on palpation of her midfoot.  CDAI Exam: CDAI Score: 0.4  Patient Global: 2 mm; Provider Global: 2 mm Swollen: 1 ; Tender: 1  Joint Exam 02/13/2021      Right  Left  Tarsometatarsal     Swollen Tender     Investigation: No additional findings.  Imaging: No results found.  Recent Labs: Lab Results  Component Value Date   WBC 5.5 11/20/2020   HGB 12.5 11/20/2020   PLT 261 11/20/2020   NA 135 11/20/2020   K 4.0 11/20/2020   CL 98 11/20/2020   CO2 29 11/20/2020   GLUCOSE 88 11/20/2020   BUN 14 11/20/2020   CREATININE 0.78 11/20/2020   BILITOT 0.6 11/20/2020   ALKPHOS 59 05/14/2016   AST 27 11/20/2020   ALT 19 11/20/2020   PROT 6.5 11/20/2020   ALBUMIN 4.5 05/14/2016   CALCIUM 9.8 11/20/2020   GFRAA 83 11/20/2020   QFTBGOLDPLUS NEGATIVE 04/27/2020    Speciality Comments: no baseline biologic labs  Procedures:  No procedures performed Allergies: Patient has no known allergies.   Assessment / Plan:     Visit Diagnoses: Rheumatoid arthritis of multiple sites with negative rheumatoid factor ([place]) -patient states her rheumatoid arthritis is very well controlled on the combination of Cimzia and methotrexate.  She denies any joint pain or joint swelling.  She states that last weekend she developed pain and swelling in her left foot she does not recall any injury.  Came to get her Cimzia injection today.  Plan: Certolizumab Pegol KIT 400 mg  High risk medication use - In-office Cimzia 400 mg sq injection every 28 days, methotrexate 0.5 ml sq injections every 7  days, and folic acid 1 mg 2 tablets daily.  - Plan: CBC with Differential/Platelet, COMPLETE METABOLIC PANEL WITH GFR  Pain in left foot -she complains of pain and discomfort in her left foot.  She had tenderness in the midfoot region.  It was warm to touch.  Plan: XR Foot Complete Left.  The x-ray was consistent with nondisplaced fracture of the base of fifth metatarsal.  We offered referral to orthopedics but she declined.  She states she will wear her shoes and some support.  She is going to [facility] for another appointment this afternoon.  She stated that when she reaches [place] she will try to find an orthopedic surgeon locally.  She has closed nondisplaced fracture of the  fifth metatarsal.  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  Primary osteoarthritis of both hands-she has severe osteoarthritis in her hands.  Trigger finger, right ring finger -she has intermittent discomfort.  Osteopenia of multiple sites - DEXA is not in Epic.  According to the patient her gynecologist has ordered her bone densities in the past.  Have also advised to schedule appointment with her GYN for evaluating her DEXA scan.  She will need more aggressive treatment for osteoporosis.  Dyslipidemia  History of Clostridium difficile infection  Orders: Orders Placed This Encounter  Procedures   XR Foot Complete Left   CBC with Differential/Platelet   COMPLETE METABOLIC PANEL WITH GFR    Meds ordered this encounter  Medications   Certolizumab Pegol KIT 388 mg   folic acid (FOLVITE) 1 MG tablet    Sig: Take 2 tablets (2 mg total) by mouth daily.    Dispense:  180 tablet    Refill:  2     Follow-Up Instructions: Return in about 5 months (around 07/16/2021) for Rheumatoid arthritis.   Bo Merino, MD  Note - This record has been created using Editor, commissioning.  Chart creation errors have been sought, but may not always  have been located. Such creation errors  do not reflect on  the standard of medical care.

## 2021-02-13 ENCOUNTER — Ambulatory Visit (INDEPENDENT_AMBULATORY_CARE_PROVIDER_SITE_OTHER): Payer: MEDICARE | Admitting: Rheumatology

## 2021-02-13 ENCOUNTER — Ambulatory Visit: Payer: Self-pay

## 2021-02-13 ENCOUNTER — Other Ambulatory Visit: Payer: Self-pay

## 2021-02-13 ENCOUNTER — Encounter: Payer: Self-pay | Admitting: Rheumatology

## 2021-02-13 VITALS — BP 134/73 | HR 83 | Ht 63.0 in | Wt 124.8 lb

## 2021-02-13 DIAGNOSIS — M0609 Rheumatoid arthritis without rheumatoid factor, multiple sites: Secondary | ICD-10-CM | POA: Diagnosis not present

## 2021-02-13 DIAGNOSIS — M19041 Primary osteoarthritis, right hand: Secondary | ICD-10-CM

## 2021-02-13 DIAGNOSIS — E785 Hyperlipidemia, unspecified: Secondary | ICD-10-CM

## 2021-02-13 DIAGNOSIS — M65341 Trigger finger, right ring finger: Secondary | ICD-10-CM

## 2021-02-13 DIAGNOSIS — M79672 Pain in left foot: Secondary | ICD-10-CM | POA: Diagnosis not present

## 2021-02-13 DIAGNOSIS — Z79899 Other long term (current) drug therapy: Secondary | ICD-10-CM | POA: Diagnosis not present

## 2021-02-13 DIAGNOSIS — M8589 Other specified disorders of bone density and structure, multiple sites: Secondary | ICD-10-CM

## 2021-02-13 DIAGNOSIS — H35389 Toxic maculopathy, unspecified eye: Secondary | ICD-10-CM

## 2021-02-13 DIAGNOSIS — S92355A Nondisplaced fracture of fifth metatarsal bone, left foot, initial encounter for closed fracture: Secondary | ICD-10-CM

## 2021-02-13 DIAGNOSIS — M19042 Primary osteoarthritis, left hand: Secondary | ICD-10-CM

## 2021-02-13 DIAGNOSIS — Z8619 Personal history of other infectious and parasitic diseases: Secondary | ICD-10-CM

## 2021-02-13 DIAGNOSIS — T372X5A Adverse effect of antimalarials and drugs acting on other blood protozoa, initial encounter: Secondary | ICD-10-CM

## 2021-02-13 MED ORDER — CERTOLIZUMAB PEGOL 2 X 200 MG ~~LOC~~ KIT
400.0000 mg | PACK | Freq: Once | SUBCUTANEOUS | Status: AC
  Administered 2021-02-13: 400 mg via SUBCUTANEOUS

## 2021-02-13 MED ORDER — FOLIC ACID 1 MG PO TABS: 2.0000 mg | ORAL_TABLET | Freq: Every day | ORAL | 2 refills | Status: DC

## 2021-02-13 NOTE — Progress Notes (Signed)
Pharmacy Note  Subjective:   Patient presents to clinic today to receive monthly dose of Cimzia.  Patient running a fever or have signs/symptoms of infection? No  Patient currently on antibiotics for the treatment of infection? No  Patient have any upcoming invasive procedures/surgeries? No  Objective: CMP     Component Value Date/Time   NA 135 11/20/2020 1401   NA 138 02/20/2016 0000   K 4.0 11/20/2020 1401   CL 98 11/20/2020 1401   CO2 29 11/20/2020 1401   GLUCOSE 88 11/20/2020 1401   BUN 14 11/20/2020 1401   BUN 13 02/20/2016 0000   CREATININE 0.78 11/20/2020 1401   CALCIUM 9.8 11/20/2020 1401   PROT 6.5 11/20/2020 1401   ALBUMIN 4.5 05/14/2016 1314   AST 27 11/20/2020 1401   ALT 19 11/20/2020 1401   ALKPHOS 59 05/14/2016 1314   BILITOT 0.6 11/20/2020 1401   GFRNONAA 72 11/20/2020 1401   GFRAA 83 11/20/2020 1401    CBC    Component Value Date/Time   WBC 5.5 11/20/2020 1401   RBC 3.99 11/20/2020 1401   HGB 12.5 11/20/2020 1401   HCT 37.4 11/20/2020 1401   PLT 261 11/20/2020 1401   MCV 93.7 11/20/2020 1401   MCH 31.3 11/20/2020 1401   MCHC 33.4 11/20/2020 1401   RDW 13.0 11/20/2020 1401   LYMPHSABS 1,881 11/20/2020 1401   MONOABS 434 05/14/2016 1314   EOSABS 39 11/20/2020 1401   BASOSABS 61 11/20/2020 1401    Baseline Immunosuppressant Therapy Labs TB GOLD Quantiferon TB Gold Latest Ref Rng & Units 04/27/2020  Quantiferon TB Gold Plus NEGATIVE NEGATIVE   Hepatitis Panel Hepatitis Latest Ref Rng & Units 03/05/2018  Hep B Surface Ag NON-REACTI NON-REACTIVE  Hep B IgM NON-REACTI NON-REACTIVE  Hep C Ab NON-REACTI NON-REACTIVE  Hep C Ab NON-REACTI NON-REACTIVE   HIV Lab Results  Component Value Date   HIV NON-REACTIVE 03/05/2018   Immunoglobulins Immunoglobulin Electrophoresis Latest Ref Rng & Units 03/05/2018  IgA  20 - 320 mg/dL 119  IgG 600 - 1,540 mg/dL 844  IgM 50 - 300 mg/dL 156   SPEP Serum Protein Electrophoresis Latest Ref Rng & Units  11/20/2020  Total Protein 6.1 - 8.1 g/dL 6.5  Albumin 3.8 - 4.8 g/dL -  Alpha-1 0.2 - 0.3 g/dL -  Alpha-2 0.5 - 0.9 g/dL -  Beta Globulin 0.4 - 0.6 g/dL -  Beta 2 0.2 - 0.5 g/dL -  Gamma Globulin 0.8 - 1.7 g/dL -   G6PD Lab Results  Component Value Date   G6PDH 17.9 11/19/2017   TPMT No results found for: TPMT   Chest x-ray: 10/2005  Assessment/Plan:   Administrations This Visit     Certolizumab Pegol KIT 400 mg     Admin Date 02/13/2021 Action Given Dose 400 mg Route Subcutaneous Administered By Carole Binning, LPN             Patient tolerated injection well.   Appointment for next injection scheduled for 03/13/2021.  Patient due for labs in September 2022.  Patient is to call and reschedule appointment if running a fever with signs/symptoms of infection, on antibiotics for active infection or has an upcoming invasive procedure.  All questions encouraged and answered.  Instructed patient to call with any further questions or concerns.

## 2021-02-13 NOTE — Patient Instructions (Signed)
Standing Labs We placed an order today for your standing lab work.   Please have your standing labs drawn in December and every 3 months  TB GOLD with next labs  If possible, please have your labs drawn 2 weeks prior to your appointment so that the provider can discuss your results at your appointment.  Please note that you may see your imaging and lab results in MyChart before we have reviewed them. We may be awaiting multiple results to interpret others before contacting you. Please allow our office up to 72 hours to thoroughly review all of the results before contacting the office for clarification of your results.  We have open lab daily: Monday through Thursday from 1:30-4:30 PM and Friday from 1:30-4:00 PM at the office of Dr. Pollyann Savoy, [facility].   Please be advised, all patients with office appointments requiring lab work will take precedent over walk-in lab work.  If possible, please come for your lab work on Monday and Friday afternoons, as you may experience shorter wait times. The office is located at [address], [address], [place], [place] [address] No appointment is necessary.   Labs are drawn by [facility]. Please bring your co-pay at the time of your lab draw.  You may receive a bill from [facility] for your lab work.  If you wish to have your labs drawn at another location, please call the office 24 hours in advance to send orders.  If you have any questions regarding directions or hours of operation,  please call [phone].   As a reminder, please drink plenty of water prior to coming for your lab work. Thanks!   Vaccines You are taking a medication(s) that can suppress your immune system.  The following immunizations are recommended: Flu annually Covid-19  Td/Tdap (tetanus, diphtheria, pertussis) every 10 years Pneumonia (Prevnar 15 then Pneumovax 23 at least 1 year apart.  Alternatively, can take Prevnar 20 without needing additional  dose) Shingrix (after age 89): 2 doses from 4 weeks to 6 months apart  Please check with your PCP to make sure you are up to date.   If you have signs or symptoms of an infection or start antibiotics: First, call your PCP for workup of your infection. Hold your medication through the infection, until you complete your antibiotics, and until symptoms resolve if you take the following: Injectable medication (Actemra, Benlysta, Cimzia, Cosentyx, Enbrel, Humira, Kevzara, Orencia, Remicade, Simponi, Stelara, Taltz, Tremfya) Methotrexate Leflunomide (Arava) Mycophenolate (Cellcept) Harriette Ohara, Olumiant, or Rinvoq  If you test POSITIVE for COVID19 and have MILD to MODERATE symptoms: First, call your PCP if you would like to receive COVID19 treatment AND Hold your medications during the infection and for at least 1 week after your symptoms have resolved: Injectable medication (Benlysta, Cimzia, Cosentyx, Enbrel, Humira, Orencia, Remicade, Simponi, Stelara, Taltz, Tremfya) Methotrexate Leflunomide (Arava) Azathioprine Mycophenolate (Cellcept) Osborne Oman, or Rinvoq Otezla If you take Actemra or Kevzara, you DO NOT need to hold these for COVID19 infection.  If you test POSITIVE for COVID19 and have NO symptoms: First, call your PCP if you would like to receive COVID19 treatment AND Hold your medications for at least 10 days after the day that you tested positive Injectable medication (Benlysta, Cimzia, Cosentyx, Enbrel, Humira, Orencia, Remicade, Simponi, Stelara, Taltz, Tremfya) Methotrexate Leflunomide (Arava) Azathioprine Mycophenolate (Cellcept) Osborne Oman, or Rinvoq Otezla If you take Actemra or Kevzara, you DO NOT need to hold these for COVID19 infection.  Heart Disease Prevention   Your  inflammatory disease increases your risk of heart disease which includes heart attack, stroke, atrial fibrillation (irregular heartbeats), high blood pressure, heart failure and  atherosclerosis (plaque in the arteries).  It is important to reduce your risk by:   Keep blood pressure, cholesterol, and blood sugar at healthy levels   Smoking Cessation   Maintain a healthy weight  BMI 20-25   Eat a healthy diet  Plenty of fresh fruit, vegetables, and whole grains  Limit saturated fats, foods high in sodium, and added sugars  DASH and Mediterranean diet   Increase physical activity  Recommend moderate physically activity for 150 minutes per week/ 30 minutes a day for five days a week These can be broken up into three separate ten-minute sessions during the day.   Reduce Stress  Meditation, slow breathing exercises, yoga, coloring books  Dental visits twice a year    Please get annual skin exam with a dermatologist to screen for non-melanoma skin cancer.

## 2021-02-14 LAB — COMPLETE METABOLIC PANEL WITH GFR
AG Ratio: 1.9 (calc) (ref 1.0–2.5)
ALT: 17 U/L (ref 6–29)
AST: 24 U/L (ref 10–35)
Albumin: 4.3 g/dL (ref 3.6–5.1)
Alkaline phosphatase (APISO): 103 U/L (ref 37–153)
BUN: 17 mg/dL (ref 7–25)
CO2: 31 mmol/L (ref 20–32)
Calcium: 9.7 mg/dL (ref 8.6–10.4)
Chloride: 99 mmol/L (ref 98–110)
Creat: 0.74 mg/dL (ref 0.60–0.95)
Globulin: 2.3 g/dL (calc) (ref 1.9–3.7)
Glucose, Bld: 66 mg/dL (ref 65–99)
Potassium: 4.1 mmol/L (ref 3.5–5.3)
Sodium: 137 mmol/L (ref 135–146)
Total Bilirubin: 0.5 mg/dL (ref 0.2–1.2)
Total Protein: 6.6 g/dL (ref 6.1–8.1)
eGFR: 82 mL/min/{1.73_m2} (ref >=60)

## 2021-02-14 LAB — CBC WITH DIFFERENTIAL/PLATELET
Absolute Monocytes: 468 cells/uL (ref 200–950)
Basophils Absolute: 48 cells/uL (ref 0–200)
Basophils Relative: 0.8 %
Eosinophils Absolute: 78 cells/uL (ref 15–500)
Eosinophils Relative: 1.3 %
HCT: 38.7 % (ref 35.0–45.0)
Hemoglobin: 12.5 g/dL (ref 11.7–15.5)
Lymphs Abs: 1068 cells/uL (ref 850–3900)
MCH: 31.4 pg (ref 27.0–33.0)
MCHC: 32.3 g/dL (ref 32.0–36.0)
MCV: 97.2 fL (ref 80.0–100.0)
MPV: 8.7 fL (ref 7.5–12.5)
Monocytes Relative: 7.8 %
Neutro Abs: 4338 cells/uL (ref 1500–7800)
Neutrophils Relative %: 72.3 %
Platelets: 296 10*3/uL (ref 140–400)
RBC: 3.98 10*6/uL (ref 3.80–5.10)
RDW: 13.1 % (ref 11.0–15.0)
Total Lymphocyte: 17.8 %
WBC: 6 10*3/uL (ref 3.8–10.8)

## 2021-02-15 ENCOUNTER — Telehealth: Payer: Self-pay

## 2021-02-15 NOTE — Telephone Encounter (Signed)
Phone number busy.

## 2021-02-15 NOTE — Telephone Encounter (Signed)
Patient called stating she saw the orthopedic doctor and was diagnosed with Jones fracture and given a boot.    Patient also requested a return call with the results of her labwork.

## 2021-02-15 NOTE — Telephone Encounter (Signed)
Phone number rings busy 

## 2021-02-18 ENCOUNTER — Other Ambulatory Visit: Payer: Self-pay | Admitting: Physician Assistant

## 2021-02-20 NOTE — Telephone Encounter (Signed)
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

## 2021-02-21 ENCOUNTER — Encounter: Payer: Self-pay | Admitting: *Deleted

## 2021-02-21 NOTE — Telephone Encounter (Signed)
Phone number rings busy. See lab note for additional details.

## 2021-03-05 ENCOUNTER — Telehealth: Payer: Self-pay

## 2021-03-05 NOTE — Telephone Encounter (Signed)
Patient advised we sent letter with lab results as were unable to reach her by phone. 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

## 2021-03-05 NOTE — Telephone Encounter (Signed)
Patient called stating she received the letter with her labwork results.  Patient states she has had problems with her phone and requested a return call on her new #[phone].

## 2021-03-13 ENCOUNTER — Ambulatory Visit (INDEPENDENT_AMBULATORY_CARE_PROVIDER_SITE_OTHER): Payer: MEDICARE | Admitting: *Deleted

## 2021-03-13 ENCOUNTER — Other Ambulatory Visit: Payer: Self-pay

## 2021-03-13 VITALS — BP 151/79 | HR 80

## 2021-03-13 DIAGNOSIS — M0609 Rheumatoid arthritis without rheumatoid factor, multiple sites: Secondary | ICD-10-CM | POA: Diagnosis not present

## 2021-03-13 MED ORDER — CERTOLIZUMAB PEGOL 2 X 200 MG ~~LOC~~ KIT
400.0000 mg | PACK | Freq: Once | SUBCUTANEOUS | Status: AC
  Administered 2021-03-13: 400 mg via SUBCUTANEOUS

## 2021-03-13 NOTE — Progress Notes (Signed)
Pharmacy Note  Subjective:   Patient presents to clinic today to receive monthly dose of Cimzia.  Patient running a fever or have signs/symptoms of infection? No  Patient currently on antibiotics for the treatment of infection? No  Patient have any upcoming invasive procedures/surgeries? No  Objective: CMP     Component Value Date/Time   NA 137 02/13/2021 0914   NA 138 02/20/2016 0000   K 4.1 02/13/2021 0914   CL 99 02/13/2021 0914   CO2 31 02/13/2021 0914   GLUCOSE 66 02/13/2021 0914   BUN 17 02/13/2021 0914   BUN 13 02/20/2016 0000   CREATININE 0.74 02/13/2021 0914   CALCIUM 9.7 02/13/2021 0914   PROT 6.6 02/13/2021 0914   ALBUMIN 4.5 05/14/2016 1314   AST 24 02/13/2021 0914   ALT 17 02/13/2021 0914   ALKPHOS 59 05/14/2016 1314   BILITOT 0.5 02/13/2021 0914   GFRNONAA 72 11/20/2020 1401   GFRAA 83 11/20/2020 1401    CBC    Component Value Date/Time   WBC 6.0 02/13/2021 0914   RBC 3.98 02/13/2021 0914   HGB 12.5 02/13/2021 0914   HCT 38.7 02/13/2021 0914   PLT 296 02/13/2021 0914   MCV 97.2 02/13/2021 0914   MCH 31.4 02/13/2021 0914   MCHC 32.3 02/13/2021 0914   RDW 13.1 02/13/2021 0914   LYMPHSABS 1,068 02/13/2021 0914   MONOABS 434 05/14/2016 1314   EOSABS 78 02/13/2021 0914   BASOSABS 48 02/13/2021 0914    Baseline Immunosuppressant Therapy Labs TB GOLD Quantiferon TB Gold Latest Ref Rng & Units 04/27/2020  Quantiferon TB Gold Plus NEGATIVE NEGATIVE   Hepatitis Panel Hepatitis Latest Ref Rng & Units 03/05/2018  Hep B Surface Ag NON-REACTI NON-REACTIVE  Hep B IgM NON-REACTI NON-REACTIVE  Hep C Ab NON-REACTI NON-REACTIVE  Hep C Ab NON-REACTI NON-REACTIVE   HIV Lab Results  Component Value Date   HIV NON-REACTIVE 03/05/2018   Immunoglobulins Immunoglobulin Electrophoresis Latest Ref Rng & Units 03/05/2018  IgA  20 - 320 mg/dL 119  IgG 600 - 1,540 mg/dL 844  IgM 50 - 300 mg/dL 156   SPEP Serum Protein Electrophoresis Latest Ref Rng & Units  02/13/2021  Total Protein 6.1 - 8.1 g/dL 6.6  Albumin 3.8 - 4.8 g/dL -  Alpha-1 0.2 - 0.3 g/dL -  Alpha-2 0.5 - 0.9 g/dL -  Beta Globulin 0.4 - 0.6 g/dL -  Beta 2 0.2 - 0.5 g/dL -  Gamma Globulin 0.8 - 1.7 g/dL -   G6PD Lab Results  Component Value Date   G6PDH 17.9 11/19/2017   TPMT No results found for: TPMT   Chest x-ray: 10/2005  Assessment/Plan:   Administrations This Visit     Certolizumab Pegol KIT 400 mg     Admin Date 03/13/2021 Action Given Dose 400 mg Route Subcutaneous Administered By Carole Binning, LPN             Patient tolerated injection well.   Appointment for next injection scheduled for April 10, 2021.  Patient due for labs in November 2022.  Patient is to call and reschedule appointment if running a fever with signs/symptoms of infection, on antibiotics for active infection or has an upcoming invasive procedure.  All questions encouraged and answered.  Instructed patient to call with any further questions or concerns.

## 2021-04-10 ENCOUNTER — Other Ambulatory Visit: Payer: Self-pay

## 2021-04-10 ENCOUNTER — Ambulatory Visit (INDEPENDENT_AMBULATORY_CARE_PROVIDER_SITE_OTHER): Payer: MEDICARE | Admitting: *Deleted

## 2021-04-10 VITALS — BP 153/82 | HR 70

## 2021-04-10 DIAGNOSIS — M0609 Rheumatoid arthritis without rheumatoid factor, multiple sites: Secondary | ICD-10-CM

## 2021-04-10 MED ORDER — CERTOLIZUMAB PEGOL 2 X 200 MG ~~LOC~~ KIT
400.0000 mg | PACK | Freq: Once | SUBCUTANEOUS | Status: AC
Start: 2021-04-10 — End: 2021-04-10
  Administered 2021-04-10: 400 mg via SUBCUTANEOUS

## 2021-04-10 NOTE — Progress Notes (Signed)
Subjective:   Patient presents to clinic today to receive monthly dose of Cimzia.  Patient running a fever or have signs/symptoms of infection? No  Patient currently on antibiotics for the treatment of infection? No  Patient have any upcoming invasive procedures/surgeries? No  Objective: CMP     Component Value Date/Time   NA 137 02/13/2021 0914   NA 138 02/20/2016 0000   K 4.1 02/13/2021 0914   CL 99 02/13/2021 0914   CO2 31 02/13/2021 0914   GLUCOSE 66 02/13/2021 0914   BUN 17 02/13/2021 0914   BUN 13 02/20/2016 0000   CREATININE 0.74 02/13/2021 0914   CALCIUM 9.7 02/13/2021 0914   PROT 6.6 02/13/2021 0914   ALBUMIN 4.5 05/14/2016 1314   AST 24 02/13/2021 0914   ALT 17 02/13/2021 0914   ALKPHOS 59 05/14/2016 1314   BILITOT 0.5 02/13/2021 0914   GFRNONAA 72 11/20/2020 1401   GFRAA 83 11/20/2020 1401    CBC    Component Value Date/Time   WBC 6.0 02/13/2021 0914   RBC 3.98 02/13/2021 0914   HGB 12.5 02/13/2021 0914   HCT 38.7 02/13/2021 0914   PLT 296 02/13/2021 0914   MCV 97.2 02/13/2021 0914   MCH 31.4 02/13/2021 0914   MCHC 32.3 02/13/2021 0914   RDW 13.1 02/13/2021 0914   LYMPHSABS 1,068 02/13/2021 0914   MONOABS 434 05/14/2016 1314   EOSABS 78 02/13/2021 0914   BASOSABS 48 02/13/2021 0914    Baseline Immunosuppressant Therapy Labs TB GOLD Quantiferon TB Gold Latest Ref Rng & Units 04/27/2020  Quantiferon TB Gold Plus NEGATIVE NEGATIVE   Hepatitis Panel Hepatitis Latest Ref Rng & Units 03/05/2018  Hep B Surface Ag NON-REACTI NON-REACTIVE  Hep B IgM NON-REACTI NON-REACTIVE  Hep C Ab NON-REACTI NON-REACTIVE  Hep C Ab NON-REACTI NON-REACTIVE   HIV Lab Results  Component Value Date   HIV NON-REACTIVE 03/05/2018   Immunoglobulins Immunoglobulin Electrophoresis Latest Ref Rng & Units 03/05/2018  IgA  20 - 320 mg/dL 119  IgG 600 - 1,540 mg/dL 844  IgM 50 - 300 mg/dL 156   SPEP Serum Protein Electrophoresis Latest Ref Rng & Units 02/13/2021  Total  Protein 6.1 - 8.1 g/dL 6.6  Albumin 3.8 - 4.8 g/dL -  Alpha-1 0.2 - 0.3 g/dL -  Alpha-2 0.5 - 0.9 g/dL -  Beta Globulin 0.4 - 0.6 g/dL -  Beta 2 0.2 - 0.5 g/dL -  Gamma Globulin 0.8 - 1.7 g/dL -   G6PD Lab Results  Component Value Date   G6PDH 17.9 11/19/2017   TPMT No results found for: TPMT   Chest x-ray:  10/2005  Assessment/Plan:   Administrations This Visit     Certolizumab Pegol KIT 400 mg     Admin Date 04/10/2021 Action Given Dose 400 mg Route Subcutaneous Administered By Carole Binning, LPN             Patient tolerated injection well.   Appointment for next injection scheduled for 05/08/2021.  Patient due for labs in November 2022.  Patient is to call and reschedule appointment if running a fever with signs/symptoms of infection, on antibiotics for active infection or has an upcoming invasive procedure.  All questions encouraged and answered.  Instructed patient to call with any further questions or concerns.

## 2021-05-01 NOTE — Progress Notes (Signed)
Office Visit Note  Patient: April Shepard             Date of Birth: 10/01/1940           MRN: [id number]             PCP: Artis Delay, MD Referring: Artis Delay, MD Visit Date: 05/15/2021 Occupation: _0 @  Subjective:  Medication monitoring  History of Present Illness: April Shepard is a 80 y.o. female with history of seronegative rheumatoid arthritis.  Patient is on Cimzia 400 mg sq injections every 4 weeks, methotrexate 0.5 ml injections once weekly, and folic acid 1 mg by mouth daily.  She has not missed any doses recently.  She is tolerating these medications without any side effects.  She denies any signs or symptoms of a rheumatoid arthritis flare.  She is not experiencing any joint pain or joint swelling at this time.  She has not had any morning stiffness, nocturnal pain, or difficulty with ADLs.  She denies any recent infections.  She received the annual influenza vaccination.    Activities of Daily Living:  Patient reports morning stiffness for 0 minutes.   Patient Denies nocturnal pain.  Difficulty dressing/grooming: Denies Difficulty climbing stairs: Denies Difficulty getting out of chair: Denies Difficulty using hands for taps, buttons, cutlery, and/or writing: Denies  Review of Systems  Constitutional:  Negative for fatigue.  HENT:  Negative for mouth sores, mouth dryness and nose dryness.   Eyes:  Negative for pain, itching and dryness.  Respiratory:  Negative for shortness of breath and difficulty breathing.   Cardiovascular:  Negative for chest pain and palpitations.  Gastrointestinal:  Negative for blood in stool, constipation and diarrhea.  Endocrine: Negative for increased urination.  Genitourinary:  Negative for difficulty urinating.  Musculoskeletal:  Negative for joint pain, joint pain, joint swelling, myalgias, morning stiffness, muscle tenderness and myalgias.  Skin:  Negative for color change, rash and redness.   Allergic/Immunologic: Negative for susceptible to infections.  Neurological:  Negative for dizziness, numbness, headaches, memory loss and weakness.  Hematological:  Negative for bruising/bleeding tendency.  Psychiatric/Behavioral:  Negative for confusion.    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   High risk medication use 10/22/2016   Trigger finger, right ring finger 10/22/2016   Osteopenia 05/13/2016   Osteoarthritis of both hands 05/13/2016   RA (rheumatoid arthritis) ([place]) 05/13/2016    Past Medical History:  Diagnosis Date   Osteoarthritis of both hands 05/13/2016   Osteopenia 05/13/2016   RA (rheumatoid arthritis) ([place]) 05/13/2016    Family History  Problem Relation Age of Onset   Breast cancer Mother    Heart attack Father    Melanoma Brother    Past Surgical History:  Procedure Laterality Date   CHOLECYSTECTOMY     Social History   Social History Narrative   Not on file   Immunization History  Administered Date(s) Administered   Influenza, High Dose Seasonal PF 03/27/2018   Moderna Sars-Covid-2 Vaccination 08/17/2019, 09/14/2019     Objective: Vital Signs: BP 117/75 (BP Location: Left Arm, Patient Position: Sitting, Cuff Size: Normal)   Pulse 69   Ht 5' 2" (1.575 m)   Wt 123 lb 9.6 oz (56.1 kg)   BMI 22.61 kg/m    Physical Exam Vitals and nursing note reviewed.  Constitutional:      Appearance: She is  well-developed.  HENT:     Head: Normocephalic and atraumatic.  Eyes:     Conjunctiva/sclera: Conjunctivae normal.  Pulmonary:     Effort: Pulmonary effort is normal.  Abdominal:     Palpations: Abdomen is soft.  Musculoskeletal:     Cervical back: Normal range of motion.  Skin:    General: Skin is warm and dry.     Capillary Refill: Capillary refill takes less than 2 seconds.  Neurological:     Mental Status: She is alert  and oriented to person, place, and time.  Psychiatric:        Behavior: Behavior normal.     Musculoskeletal Exam: C-spine, thoracic spine, and lumbar spine good ROM.  Shoulder joints, elbow joints, wrist joints, MCPs, PIPs, and DIPs good ROM with no synovitis.  Complete fist formation bilaterally.  CMC joint, PIPs, and DIP thickening consistent with OA both hands noted.  Hip joints have good ROM with no groin pain.  Knee joints have good ROM with no warmth or effusion.  Ankle joints have good ROM with no tenderness or joint swelling. Bunions over 1st and 5th MTPs noted.  Dorsal spurs noted bilaterally.    CDAI Exam: CDAI Score: 0.2  Patient Global: 1 mm; Provider Global: 1 mm Swollen: 0 ; Tender: 0  Joint Exam 05/15/2021   No joint exam has been documented for this visit   There is currently no information documented on the homunculus. Go to the Rheumatology activity and complete the homunculus joint exam.  Investigation: No additional findings.  Imaging: No results found.  Recent Labs: Lab Results  Component Value Date   WBC 6.0 02/13/2021   HGB 12.5 02/13/2021   PLT 296 02/13/2021   NA 137 02/13/2021   K 4.1 02/13/2021   CL 99 02/13/2021   CO2 31 02/13/2021   GLUCOSE 66 02/13/2021   BUN 17 02/13/2021   CREATININE 0.74 02/13/2021   BILITOT 0.5 02/13/2021   ALKPHOS 59 05/14/2016   AST 24 02/13/2021   ALT 17 02/13/2021   PROT 6.6 02/13/2021   ALBUMIN 4.5 05/14/2016   CALCIUM 9.7 02/13/2021   GFRAA 83 11/20/2020   QFTBGOLDPLUS NEGATIVE 04/27/2020    Speciality Comments: no baseline biologic labs  Procedures:  No procedures performed Allergies: Patient has no known allergies.   Assessment / Plan:     Visit Diagnoses: Rheumatoid arthritis of multiple sites with negative rheumatoid factor (HCC): She has no joint tenderness or synovitis on examination.  She has not had any signs or symptoms of a rheumatoid arthritis flare.  She is clinically doing well on Cimzia 400  mg sq injections every 28 days, methotrexate 0.5 mL sq injections once weekly, and folic acid 2 mg by mouth daily.  She has not missed any doses of these medications recently continues to tolerate them without any side effects.  She has not had any morning stiffness, nocturnal pain, or difficulty with ADLs.  She will remain on the current treatment regimen.  Refills of MTX, syringes, and folic acid were sent to the pharmacy.  Her dose of in-office cimzia was administered today in the office. She was advised to notify us if she develops increased joint pain or joint swelling.  She will follow-up in the office in 5 months.  High risk medication use - In-office Cimzia 400 mg sq injection every 28 days, methotrexate 0.5 ml sq injections every 7 days, and folic acid 1 mg 2 tablets daily.  CMP updated on 05/07/2021.  CBC   updated on 02/13/2021.  She is due to update CBC today.  Next lab work will be due and February and every 3 months to monitor for drug toxicity.  Standing orders for CBC and CMP will be placed today.  TB Gold negative on 04/27/2020.  Order for TB gold released today. - Plan: CBC with Differential/Platelet, QuantiFERON-TB Gold Plus, CBC with Differential/Platelet, COMPLETE METABOLIC PANEL WITH GFR She has not had any recent infections.  Discussed the importance of holding Cimzia and methotrexate if she develops signs or symptoms of an infection and to resume once the infection has completely cleared. She has received the annual influenza vaccination.  She has not received the most recent COVID booster and is going to hold off at this time. She had a recent skin examination as advised.   Screening for tuberculosis - Order for TB gold released today. Plan: QuantiFERON-TB Gold Plus  Pain in left foot: Resolved.  X-rays of the left foot were obtained on 02/13/2021 which were consistent with a nondisplaced fracture of the base of the fifth metatarsal.  She was evaluated by orthopedics and was given a  boot to wear.  Her discomfort is completely resolved.  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  No tenderness or inflammation was noted on examination today.  She was able to make a complete fist bilaterally.  Discussed the importance of joint protection and muscle strengthening.  Trigger finger, right ring finger: Resolved  Other medical conditions are listed as follows:   Osteopenia of multiple sites - DEXA is not in Epic.  According to the patient her gynecologist has ordered her bone densities in the past.   Dyslipidemia: Lipid panel updated on 05/07/2021: Cholesterol 135, triglycerides 79, and HDL 74.  History of Clostridium difficile infection    Orders: Orders Placed This Encounter  Procedures   CBC with Differential/Platelet   QuantiFERON-TB Gold Plus   CBC with Differential/Platelet   COMPLETE METABOLIC PANEL WITH GFR   Meds ordered this encounter  Medications   methotrexate 50 MG/2ML injection    Sig: Inject 0.5 mLs (12.5 mg total) into the skin once a week.    Dispense:  6 mL    Refill:  0   Tuberculin-Allergy Syringes 27G X 1/2" 1 ML MISC    Sig: Use 1 syringe once weekly to inject methotrexate.    Dispense:  12 each    Refill:  3   folic acid (FOLVITE) 1 MG tablet    Sig: Take 2 tablets (2 mg total) by mouth daily.    Dispense:  180 tablet    Refill:  2   Certolizumab Pegol KIT 400 mg     Follow-Up Instructions: Return in 5 months (on 10/13/2021) for Rheumatoid arthritis.   Ofilia Neas, PA-C  Note - This record has been created using Dragon software.  Chart creation errors have been sought, but may not always  have been located. Such creation errors do not reflect on  the standard of medical care.

## 2021-05-08 ENCOUNTER — Ambulatory Visit: Payer: MEDICARE

## 2021-05-15 ENCOUNTER — Ambulatory Visit (INDEPENDENT_AMBULATORY_CARE_PROVIDER_SITE_OTHER): Payer: MEDICARE | Admitting: Physician Assistant

## 2021-05-15 ENCOUNTER — Other Ambulatory Visit: Payer: Self-pay

## 2021-05-15 ENCOUNTER — Encounter: Payer: Self-pay | Admitting: Physician Assistant

## 2021-05-15 VITALS — BP 117/75 | HR 69 | Ht 62.0 in | Wt 123.6 lb

## 2021-05-15 DIAGNOSIS — Z8619 Personal history of other infectious and parasitic diseases: Secondary | ICD-10-CM

## 2021-05-15 DIAGNOSIS — Z111 Encounter for screening for respiratory tuberculosis: Secondary | ICD-10-CM

## 2021-05-15 DIAGNOSIS — Z79899 Other long term (current) drug therapy: Secondary | ICD-10-CM | POA: Diagnosis not present

## 2021-05-15 DIAGNOSIS — M65341 Trigger finger, right ring finger: Secondary | ICD-10-CM

## 2021-05-15 DIAGNOSIS — M0609 Rheumatoid arthritis without rheumatoid factor, multiple sites: Secondary | ICD-10-CM | POA: Diagnosis not present

## 2021-05-15 DIAGNOSIS — M8589 Other specified disorders of bone density and structure, multiple sites: Secondary | ICD-10-CM

## 2021-05-15 DIAGNOSIS — M79672 Pain in left foot: Secondary | ICD-10-CM | POA: Diagnosis not present

## 2021-05-15 DIAGNOSIS — H35389 Toxic maculopathy, unspecified eye: Secondary | ICD-10-CM | POA: Diagnosis not present

## 2021-05-15 DIAGNOSIS — E785 Hyperlipidemia, unspecified: Secondary | ICD-10-CM

## 2021-05-15 DIAGNOSIS — M19041 Primary osteoarthritis, right hand: Secondary | ICD-10-CM

## 2021-05-15 DIAGNOSIS — M19042 Primary osteoarthritis, left hand: Secondary | ICD-10-CM

## 2021-05-15 DIAGNOSIS — T372X5A Adverse effect of antimalarials and drugs acting on other blood protozoa, initial encounter: Secondary | ICD-10-CM

## 2021-05-15 MED ORDER — "TUBERCULIN-ALLERGY SYRINGES 27G X 1/2"" 1 ML MISC": 3 refills | Status: DC

## 2021-05-15 MED ORDER — METHOTREXATE SODIUM CHEMO INJECTION 50 MG/2ML: 12.5000 mg | INTRAMUSCULAR | 0 refills | Status: DC

## 2021-05-15 MED ORDER — CERTOLIZUMAB PEGOL 2 X 200 MG ~~LOC~~ KIT
400.0000 mg | PACK | Freq: Once | SUBCUTANEOUS | Status: AC
  Administered 2021-05-15: 400 mg via SUBCUTANEOUS

## 2021-05-15 MED ORDER — FOLIC ACID 1 MG PO TABS
2.0000 mg | ORAL_TABLET | Freq: Every day | ORAL | 2 refills | Status: DC
Start: 2021-05-15 — End: 2022-01-16

## 2021-05-15 NOTE — Progress Notes (Signed)
Subjective:   Patient presents to clinic today to receive monthly dose of Cimzia.  Patient running a fever or have signs/symptoms of infection? No  Patient currently on antibiotics for the treatment of infection? No  Patient have any upcoming invasive procedures/surgeries? No  Objective: CMP     Component Value Date/Time   NA 137 02/13/2021 0914   NA 138 02/20/2016 0000   K 4.1 02/13/2021 0914   CL 99 02/13/2021 0914   CO2 31 02/13/2021 0914   GLUCOSE 66 02/13/2021 0914   BUN 17 02/13/2021 0914   BUN 13 02/20/2016 0000   CREATININE 0.74 02/13/2021 0914   CALCIUM 9.7 02/13/2021 0914   PROT 6.6 02/13/2021 0914   ALBUMIN 4.5 05/14/2016 1314   AST 24 02/13/2021 0914   ALT 17 02/13/2021 0914   ALKPHOS 59 05/14/2016 1314   BILITOT 0.5 02/13/2021 0914   GFRNONAA 72 11/20/2020 1401   GFRAA 83 11/20/2020 1401    CBC    Component Value Date/Time   WBC 6.0 02/13/2021 0914   RBC 3.98 02/13/2021 0914   HGB 12.5 02/13/2021 0914   HCT 38.7 02/13/2021 0914   PLT 296 02/13/2021 0914   MCV 97.2 02/13/2021 0914   MCH 31.4 02/13/2021 0914   MCHC 32.3 02/13/2021 0914   RDW 13.1 02/13/2021 0914   LYMPHSABS 1,068 02/13/2021 0914   MONOABS 434 05/14/2016 1314   EOSABS 78 02/13/2021 0914   BASOSABS 48 02/13/2021 0914    Baseline Immunosuppressant Therapy Labs TB GOLD Quantiferon TB Gold Latest Ref Rng & Units 04/27/2020  Quantiferon TB Gold Plus NEGATIVE NEGATIVE   Hepatitis Panel Hepatitis Latest Ref Rng & Units 03/05/2018  Hep B Surface Ag NON-REACTI NON-REACTIVE  Hep B IgM NON-REACTI NON-REACTIVE  Hep C Ab NON-REACTI NON-REACTIVE  Hep C Ab NON-REACTI NON-REACTIVE   HIV Lab Results  Component Value Date   HIV NON-REACTIVE 03/05/2018   Immunoglobulins Immunoglobulin Electrophoresis Latest Ref Rng & Units 03/05/2018  IgA  20 - 320 mg/dL 119  IgG 600 - 1,540 mg/dL 844  IgM 50 - 300 mg/dL 156   SPEP Serum Protein Electrophoresis Latest Ref Rng & Units 02/13/2021  Total  Protein 6.1 - 8.1 g/dL 6.6  Albumin 3.8 - 4.8 g/dL -  Alpha-1 0.2 - 0.3 g/dL -  Alpha-2 0.5 - 0.9 g/dL -  Beta Globulin 0.4 - 0.6 g/dL -  Beta 2 0.2 - 0.5 g/dL -  Gamma Globulin 0.8 - 1.7 g/dL -   G6PD Lab Results  Component Value Date   G6PDH 17.9 11/19/2017   TPMT No results found for: TPMT   Chest x-ray: 10/2005  Assessment/Plan:   Administrations This Visit     Certolizumab Pegol KIT 400 mg     Admin Date 05/15/2021 Action Given Dose 400 mg Route Subcutaneous Administered By Carole Binning, LPN             Patient tolerated injection well.   Appointment for next injection scheduled for 06/12/2021.  Patient due for labs today and were drawn in office. Labs will be due again February 2023.  Patient is to call and reschedule appointment if running a fever with signs/symptoms of infection, on antibiotics for active infection or has an upcoming invasive procedure.  All questions encouraged and answered.  Instructed patient to call with any further questions or concerns.

## 2021-05-15 NOTE — Patient Instructions (Signed)
Standing Labs We placed an order today for your standing lab work.   Please have your standing labs drawn in February and every 3 months   If possible, please have your labs drawn 2 weeks prior to your appointment so that the provider can discuss your results at your appointment.  Please note that you may see your imaging and lab results in MyChart before we have reviewed them. We may be awaiting multiple results to interpret others before contacting you. Please allow our office up to 72 hours to thoroughly review all of the results before contacting the office for clarification of your results.  We have open lab daily: Monday through Thursday from 1:30-4:30 PM and Friday from 1:30-4:00 PM at the office of Dr. Shaili Deveshwar, [facility].   Please be advised, all patients with office appointments requiring lab work will take precedent over walk-in lab work.  If possible, please come for your lab work on Monday and Friday afternoons, as you may experience shorter wait times. The office is located at [address], [address], [place], [place] [address] No appointment is necessary.   Labs are drawn by [facility]. Please bring your co-pay at the time of your lab draw.  You may receive a bill from [facility] for your lab work.  If you wish to have your labs drawn at another location, please call the office 24 hours in advance to send orders.  If you have any questions regarding directions or hours of operation,  please call [phone].   As a reminder, please drink plenty of water prior to coming for your lab work. Thanks!  

## 2021-05-16 NOTE — Progress Notes (Signed)
CBC with diff WNL

## 2021-05-17 LAB — QUANTIFERON-TB GOLD PLUS
Mitogen-NIL: >10 IU/mL
NIL: 0.07 IU/mL
QuantiFERON-TB Gold Plus: NEGATIVE
TB1-NIL: <0 IU/mL
TB2-NIL: <0 IU/mL

## 2021-05-17 LAB — CBC WITH DIFFERENTIAL/PLATELET
Absolute Monocytes: 569 cells/uL (ref 200–950)
Basophils Absolute: 71 cells/uL (ref 0–200)
Basophils Relative: 0.9 %
Eosinophils Absolute: 40 cells/uL (ref 15–500)
Eosinophils Relative: 0.5 %
HCT: 37.2 % (ref 35.0–45.0)
Hemoglobin: 12.8 g/dL (ref 11.7–15.5)
Lymphs Abs: 1912 cells/uL (ref 850–3900)
MCH: 31.9 pg (ref 27.0–33.0)
MCHC: 34.4 g/dL (ref 32.0–36.0)
MCV: 92.8 fL (ref 80.0–100.0)
MPV: 8.8 fL (ref 7.5–12.5)
Monocytes Relative: 7.2 %
Neutro Abs: 5309 cells/uL (ref 1500–7800)
Neutrophils Relative %: 67.2 %
Platelets: 272 10*3/uL (ref 140–400)
RBC: 4.01 10*6/uL (ref 3.80–5.10)
RDW: 13.2 % (ref 11.0–15.0)
Total Lymphocyte: 24.2 %
WBC: 7.9 10*3/uL (ref 3.8–10.8)

## 2021-05-17 NOTE — Progress Notes (Signed)
TB gold negative

## 2021-06-12 ENCOUNTER — Ambulatory Visit (INDEPENDENT_AMBULATORY_CARE_PROVIDER_SITE_OTHER): Payer: MEDICARE | Admitting: *Deleted

## 2021-06-12 ENCOUNTER — Other Ambulatory Visit: Payer: Self-pay

## 2021-06-12 VITALS — BP 149/76 | HR 84

## 2021-06-12 DIAGNOSIS — M0609 Rheumatoid arthritis without rheumatoid factor, multiple sites: Secondary | ICD-10-CM

## 2021-06-12 MED ORDER — CERTOLIZUMAB PEGOL 2 X 200 MG ~~LOC~~ KIT
400.0000 mg | PACK | Freq: Once | SUBCUTANEOUS | Status: AC
  Administered 2021-06-12: 400 mg via SUBCUTANEOUS

## 2021-06-12 NOTE — Progress Notes (Signed)
Subjective:   Patient presents to clinic today to receive monthly dose of Cimzia.  Patient running a fever or have signs/symptoms of infection? No  Patient currently on antibiotics for the treatment of infection? No  Patient have any upcoming invasive procedures/surgeries? No  Objective: CMP     Component Value Date/Time   NA 137 02/13/2021 0914   NA 138 02/20/2016 0000   K 4.1 02/13/2021 0914   CL 99 02/13/2021 0914   CO2 31 02/13/2021 0914   GLUCOSE 66 02/13/2021 0914   BUN 17 02/13/2021 0914   BUN 13 02/20/2016 0000   CREATININE 0.74 02/13/2021 0914   CALCIUM 9.7 02/13/2021 0914   PROT 6.6 02/13/2021 0914   ALBUMIN 4.5 05/14/2016 1314   AST 24 02/13/2021 0914   ALT 17 02/13/2021 0914   ALKPHOS 59 05/14/2016 1314   BILITOT 0.5 02/13/2021 0914   GFRNONAA 72 11/20/2020 1401   GFRAA 83 11/20/2020 1401    CBC    Component Value Date/Time   WBC 7.9 05/15/2021 1345   RBC 4.01 05/15/2021 1345   HGB 12.8 05/15/2021 1345   HCT 37.2 05/15/2021 1345   PLT 272 05/15/2021 1345   MCV 92.8 05/15/2021 1345   MCH 31.9 05/15/2021 1345   MCHC 34.4 05/15/2021 1345   RDW 13.2 05/15/2021 1345   LYMPHSABS 1,912 05/15/2021 1345   MONOABS 434 05/14/2016 1314   EOSABS 40 05/15/2021 1345   BASOSABS 71 05/15/2021 1345    Baseline Immunosuppressant Therapy Labs TB GOLD Quantiferon TB Gold Latest Ref Rng & Units 05/15/2021  Quantiferon TB Gold Plus NEGATIVE NEGATIVE   Hepatitis Panel Hepatitis Latest Ref Rng & Units 03/05/2018  Hep B Surface Ag NON-REACTI NON-REACTIVE  Hep B IgM NON-REACTI NON-REACTIVE  Hep C Ab NON-REACTI NON-REACTIVE  Hep C Ab NON-REACTI NON-REACTIVE   HIV Lab Results  Component Value Date   HIV NON-REACTIVE 03/05/2018   Immunoglobulins Immunoglobulin Electrophoresis Latest Ref Rng & Units 03/05/2018  IgA  20 - 320 mg/dL 119  IgG 600 - 1,540 mg/dL 844  IgM 50 - 300 mg/dL 156   SPEP Serum Protein Electrophoresis Latest Ref Rng & Units 02/13/2021  Total  Protein 6.1 - 8.1 g/dL 6.6  Albumin 3.8 - 4.8 g/dL -  Alpha-1 0.2 - 0.3 g/dL -  Alpha-2 0.5 - 0.9 g/dL -  Beta Globulin 0.4 - 0.6 g/dL -  Beta 2 0.2 - 0.5 g/dL -  Gamma Globulin 0.8 - 1.7 g/dL -   G6PD Lab Results  Component Value Date   G6PDH 17.9 11/19/2017   TPMT No results found for: TPMT   Chest x-ray: 10/2005  Assessment/Plan:   Administrations This Visit     Certolizumab Pegol KIT 400 mg     Admin Date 06/12/2021 Action Given Dose 400 mg Route Subcutaneous Administered By Carole Binning, LPN             Patient tolerated injection well.   Appointment for next injection scheduled for 07/12/2021.  Patient due for labs in February 2023.  Patient is to call and reschedule appointment if running a fever with signs/symptoms of infection, on antibiotics for active infection or has an upcoming invasive procedure.  All questions encouraged and answered.  Instructed patient to call with any further questions or concerns.

## 2021-07-09 ENCOUNTER — Other Ambulatory Visit: Payer: Self-pay | Admitting: Physician Assistant

## 2021-07-09 NOTE — Telephone Encounter (Signed)
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 0.6   Okay to refill MTX?

## 2021-07-12 ENCOUNTER — Other Ambulatory Visit: Payer: Self-pay

## 2021-07-12 ENCOUNTER — Ambulatory Visit (INDEPENDENT_AMBULATORY_CARE_PROVIDER_SITE_OTHER): Payer: MEDICARE | Admitting: *Deleted

## 2021-07-12 VITALS — BP 135/76 | HR 76

## 2021-07-12 DIAGNOSIS — M0609 Rheumatoid arthritis without rheumatoid factor, multiple sites: Secondary | ICD-10-CM

## 2021-07-12 MED ORDER — CERTOLIZUMAB PEGOL 2 X 200 MG ~~LOC~~ KIT
400.0000 mg | PACK | Freq: Once | SUBCUTANEOUS | Status: AC
  Administered 2021-07-12: 400 mg via SUBCUTANEOUS

## 2021-07-12 NOTE — Progress Notes (Signed)
Subjective:   Patient presents to clinic today to receive monthly dose of Cimzia.  Patient running a fever or have signs/symptoms of infection? No  Patient currently on antibiotics for the treatment of infection? No  Patient have any upcoming invasive procedures/surgeries? No  Objective: CMP     Component Value Date/Time   NA 137 02/13/2021 0914   NA 138 02/20/2016 0000   K 4.1 02/13/2021 0914   CL 99 02/13/2021 0914   CO2 31 02/13/2021 0914   GLUCOSE 66 02/13/2021 0914   BUN 17 02/13/2021 0914   BUN 13 02/20/2016 0000   CREATININE 0.74 02/13/2021 0914   CALCIUM 9.7 02/13/2021 0914   PROT 6.6 02/13/2021 0914   ALBUMIN 4.5 05/14/2016 1314   AST 24 02/13/2021 0914   ALT 17 02/13/2021 0914   ALKPHOS 59 05/14/2016 1314   BILITOT 0.5 02/13/2021 0914   GFRNONAA 72 11/20/2020 1401   GFRAA 83 11/20/2020 1401    CBC    Component Value Date/Time   WBC 7.9 05/15/2021 1345   RBC 4.01 05/15/2021 1345   HGB 12.8 05/15/2021 1345   HCT 37.2 05/15/2021 1345   PLT 272 05/15/2021 1345   MCV 92.8 05/15/2021 1345   MCH 31.9 05/15/2021 1345   MCHC 34.4 05/15/2021 1345   RDW 13.2 05/15/2021 1345   LYMPHSABS 1,912 05/15/2021 1345   MONOABS 434 05/14/2016 1314   EOSABS 40 05/15/2021 1345   BASOSABS 71 05/15/2021 1345    Baseline Immunosuppressant Therapy Labs TB GOLD Quantiferon TB Gold Latest Ref Rng & Units 05/15/2021  Quantiferon TB Gold Plus NEGATIVE NEGATIVE   Hepatitis Panel Hepatitis Latest Ref Rng & Units 03/05/2018  Hep B Surface Ag NON-REACTI NON-REACTIVE  Hep B IgM NON-REACTI NON-REACTIVE  Hep C Ab NON-REACTI NON-REACTIVE  Hep C Ab NON-REACTI NON-REACTIVE   HIV Lab Results  Component Value Date   HIV NON-REACTIVE 03/05/2018   Immunoglobulins Immunoglobulin Electrophoresis Latest Ref Rng & Units 03/05/2018  IgA  20 - 320 mg/dL 119  IgG 600 - 1,540 mg/dL 844  IgM 50 - 300 mg/dL 156   SPEP Serum Protein Electrophoresis Latest Ref Rng & Units 02/13/2021  Total  Protein 6.1 - 8.1 g/dL 6.6  Albumin 3.8 - 4.8 g/dL -  Alpha-1 0.2 - 0.3 g/dL -  Alpha-2 0.5 - 0.9 g/dL -  Beta Globulin 0.4 - 0.6 g/dL -  Beta 2 0.2 - 0.5 g/dL -  Gamma Globulin 0.8 - 1.7 g/dL -   G6PD Lab Results  Component Value Date   G6PDH 17.9 11/19/2017   TPMT No results found for: TPMT   Chest x-ray: 10/2005  Assessment/Plan:   Administrations This Visit     Certolizumab Pegol KIT 400 mg     Admin Date 07/12/2021 Action Given Dose 400 mg Route Subcutaneous Administered By Carole Binning, LPN             Patient tolerated injection well.   Appointment for next injection scheduled for 08/09/2021.  Patient due for labs in February 2023.  Patient is to call and reschedule appointment if running a fever with signs/symptoms of infection, on antibiotics for active infection or has an upcoming invasive procedure.  All questions encouraged and answered.  Instructed patient to call with any further questions or concerns.

## 2021-08-09 ENCOUNTER — Other Ambulatory Visit: Payer: Self-pay

## 2021-08-09 ENCOUNTER — Ambulatory Visit (INDEPENDENT_AMBULATORY_CARE_PROVIDER_SITE_OTHER): Payer: MEDICARE | Admitting: *Deleted

## 2021-08-09 VITALS — BP 124/74 | HR 76

## 2021-08-09 DIAGNOSIS — M0609 Rheumatoid arthritis without rheumatoid factor, multiple sites: Secondary | ICD-10-CM

## 2021-08-09 DIAGNOSIS — Z79899 Other long term (current) drug therapy: Secondary | ICD-10-CM

## 2021-08-09 LAB — COMPLETE METABOLIC PANEL WITH GFR
AG Ratio: 1.9 (calc) (ref 1.0–2.5)
ALT: 18 U/L (ref 6–29)
AST: 27 U/L (ref 10–35)
Albumin: 4.3 g/dL (ref 3.6–5.1)
Alkaline phosphatase (APISO): 84 U/L (ref 37–153)
BUN: 21 mg/dL (ref 7–25)
CO2: 31 mmol/L (ref 20–32)
Calcium: 9.5 mg/dL (ref 8.6–10.4)
Chloride: 95 mmol/L — ABNORMAL LOW (ref 98–110)
Creat: 0.76 mg/dL (ref 0.60–0.95)
Globulin: 2.3 g/dL (calc) (ref 1.9–3.7)
Glucose, Bld: 82 mg/dL (ref 65–99)
Potassium: 3.6 mmol/L (ref 3.5–5.3)
Sodium: 132 mmol/L — ABNORMAL LOW (ref 135–146)
Total Bilirubin: 0.5 mg/dL (ref 0.2–1.2)
Total Protein: 6.6 g/dL (ref 6.1–8.1)
eGFR: 79 mL/min/{1.73_m2} (ref >=60)

## 2021-08-09 LAB — CBC WITH DIFFERENTIAL/PLATELET
Absolute Monocytes: 462 cells/uL (ref 200–950)
Basophils Absolute: 62 cells/uL (ref 0–200)
Basophils Relative: 0.8 %
Eosinophils Absolute: 39 cells/uL (ref 15–500)
Eosinophils Relative: 0.5 %
HCT: 36.1 % (ref 35.0–45.0)
Hemoglobin: 12.3 g/dL (ref 11.7–15.5)
Lymphs Abs: 1887 cells/uL (ref 850–3900)
MCH: 31.5 pg (ref 27.0–33.0)
MCHC: 34.1 g/dL (ref 32.0–36.0)
MCV: 92.6 fL (ref 80.0–100.0)
MPV: 8.6 fL (ref 7.5–12.5)
Monocytes Relative: 6 %
Neutro Abs: 5251 cells/uL (ref 1500–7800)
Neutrophils Relative %: 68.2 %
Platelets: 293 10*3/uL (ref 140–400)
RBC: 3.9 10*6/uL (ref 3.80–5.10)
RDW: 13.6 % (ref 11.0–15.0)
Total Lymphocyte: 24.5 %
WBC: 7.7 10*3/uL (ref 3.8–10.8)

## 2021-08-09 MED ORDER — CERTOLIZUMAB PEGOL 2 X 200 MG ~~LOC~~ KIT
400.0000 mg | PACK | Freq: Once | SUBCUTANEOUS | Status: AC
  Administered 2021-08-09: 400 mg via SUBCUTANEOUS

## 2021-08-09 NOTE — Progress Notes (Signed)
Subjective:   Patient presents to clinic today to receive monthly dose of Cimzia.  Patient running a fever or have signs/symptoms of infection? No  Patient currently on antibiotics for the treatment of infection? No  Patient have any upcoming invasive procedures/surgeries? No  Objective: CMP     Component Value Date/Time   NA 137 02/13/2021 0914   NA 138 02/20/2016 0000   K 4.1 02/13/2021 0914   CL 99 02/13/2021 0914   CO2 31 02/13/2021 0914   GLUCOSE 66 02/13/2021 0914   BUN 17 02/13/2021 0914   BUN 13 02/20/2016 0000   CREATININE 0.74 02/13/2021 0914   CALCIUM 9.7 02/13/2021 0914   PROT 6.6 02/13/2021 0914   ALBUMIN 4.5 05/14/2016 1314   AST 24 02/13/2021 0914   ALT 17 02/13/2021 0914   ALKPHOS 59 05/14/2016 1314   BILITOT 0.5 02/13/2021 0914   GFRNONAA 72 11/20/2020 1401   GFRAA 83 11/20/2020 1401    CBC    Component Value Date/Time   WBC 7.9 05/15/2021 1345   RBC 4.01 05/15/2021 1345   HGB 12.8 05/15/2021 1345   HCT 37.2 05/15/2021 1345   PLT 272 05/15/2021 1345   MCV 92.8 05/15/2021 1345   MCH 31.9 05/15/2021 1345   MCHC 34.4 05/15/2021 1345   RDW 13.2 05/15/2021 1345   LYMPHSABS 1,912 05/15/2021 1345   MONOABS 434 05/14/2016 1314   EOSABS 40 05/15/2021 1345   BASOSABS 71 05/15/2021 1345    Baseline Immunosuppressant Therapy Labs TB GOLD Quantiferon TB Gold Latest Ref Rng & Units 05/15/2021  Quantiferon TB Gold Plus NEGATIVE NEGATIVE   Hepatitis Panel Hepatitis Latest Ref Rng & Units 03/05/2018  Hep B Surface Ag NON-REACTI NON-REACTIVE  Hep B IgM NON-REACTI NON-REACTIVE  Hep C Ab NON-REACTI NON-REACTIVE  Hep C Ab NON-REACTI NON-REACTIVE   HIV Lab Results  Component Value Date   HIV NON-REACTIVE 03/05/2018   Immunoglobulins Immunoglobulin Electrophoresis Latest Ref Rng & Units 03/05/2018  IgA  20 - 320 mg/dL 119  IgG 600 - 1,540 mg/dL 844  IgM 50 - 300 mg/dL 156   SPEP Serum Protein Electrophoresis Latest Ref Rng & Units 02/13/2021  Total  Protein 6.1 - 8.1 g/dL 6.6  Albumin 3.8 - 4.8 g/dL -  Alpha-1 0.2 - 0.3 g/dL -  Alpha-2 0.5 - 0.9 g/dL -  Beta Globulin 0.4 - 0.6 g/dL -  Beta 2 0.2 - 0.5 g/dL -  Gamma Globulin 0.8 - 1.7 g/dL -   G6PD Lab Results  Component Value Date   G6PDH 17.9 11/19/2017   TPMT No results found for: TPMT   Chest x-ray:  10/2005  Assessment/Plan:   Patient tolerated injection well.   Appointment for next injection scheduled for September 06, 2021.  Patient due for labs today and they were drawn while in office.  Patient is to call and reschedule appointment if running a fever with signs/symptoms of infection, on antibiotics for active infection or has an upcoming invasive procedure.  All questions encouraged and answered.  Instructed patient to call with any further questions or concerns.

## 2021-08-10 ENCOUNTER — Telehealth: Payer: Self-pay

## 2021-08-10 NOTE — Progress Notes (Signed)
CBC is normal.  CMP showed low sodium and low potassium.  Please forward results to her PCP.

## 2021-08-10 NOTE — Telephone Encounter (Signed)
Patient called stating she just noticed she has her Cimzia injection scheduled for 3/23 and her 5 month follow-up appointment with Dr. Corliss Skains on 3/29.  Patient is requesting to have the injection on 3/29 with her appointment so she doesn't have to make two trips.  Patient requested a return call.

## 2021-08-14 ENCOUNTER — Encounter: Payer: Self-pay | Admitting: *Deleted

## 2021-08-30 NOTE — Progress Notes (Signed)
? ?Office Visit Note ? ?Patient: April Shepard             ?Date of Birth: 08/10/1940           ?MRN: [id number]             ?PCP: Artis Delay, MD ?Referring: Artis Delay, MD ?Visit Date: 09/12/2021 ?Occupation: _0 @ ? ?Subjective:  ?Medication management ? ?History of Present Illness: April Shepard is a 81 y.o. female with history of seronegative rheumatoid arthritis and osteoarthritis.  She has been doing well on the combination of Cimzia in office monthly injections and methotrexate 0.5 mL SQ weekly.  She has not noticed any joint pain or joint swelling recently.  She also has underlying osteoarthritis in her feet which causes some discomfort off and on.  She has not noticed any swelling in her feet.  She has been tolerating medications well.  She continues to have problems with her right ring finger. ? ?Activities of Daily Living:  ?Patient reports morning stiffness for 0  none .   ?Patient Denies nocturnal pain.  ?Difficulty dressing/grooming: Denies ?Difficulty climbing stairs: Denies ?Difficulty getting out of chair: Denies ?Difficulty using hands for taps, buttons, cutlery, and/or writing: Denies ? ?Review of Systems  ?Constitutional:  Negative for fatigue.  ?HENT:  Negative for mouth dryness.   ?Eyes:  Negative for dryness.  ?Respiratory:  Negative for shortness of breath.   ?Cardiovascular:  Negative for swelling in legs/feet.  ?Gastrointestinal:  Negative for constipation.  ?Endocrine: Negative for excessive thirst.  ?Genitourinary:  Negative for difficulty urinating.  ?Musculoskeletal:  Negative for joint pain and joint pain.  ?Skin:  Negative for rash.  ?Allergic/Immunologic: Negative for susceptible to infections.  ?Neurological:  Negative for weakness.  ?Hematological:  Negative for bruising/bleeding tendency.  ?Psychiatric/Behavioral:  Negative for sleep disturbance.   ? ?PMFS History:  ?Patient Active Problem List  ? Diagnosis Date Noted  ? Dyslipidemia 07/23/2018  ? 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 High risk medication use 10/22/2016  ? Trigger finger, right ring finger 10/22/2016  ? Osteopenia 05/13/2016  ? Osteoarthritis of both hands 05/13/2016  ? RA (rheumatoid arthritis) ([place]) 05/13/2016  ?  ?Past Medical History:  ?Diagnosis Date  ? Osteoarthritis of both hands 05/13/2016  ? Osteopenia 05/13/2016  ? RA (rheumatoid arthritis) ([place]) 05/13/2016  ?  ?Family History  ?Problem Relation Age of Onset  ? Breast cancer Mother   ? Heart attack Father   ? Melanoma Brother   ? ?Past Surgical History:  ?Procedure Laterality Date  ? CHOLECYSTECTOMY    ? ?Social History  ? ?Social History Narrative  ? Not on file  ? ?Immunization History  ?Administered Date(s) Administered  ? Influenza, High Dose Seasonal PF 03/27/2018  ? Moderna Sars-Covid-2 Vaccination 08/17/2019, 09/14/2019, 05/08/2020  ?  ? ?Objective: ?Vital Signs: BP 136/72 (BP Location: Left Arm, Patient Position: Sitting, Cuff Size: Normal)   Pulse 70   Resp 15   Ht _1  (1.575 m)   Wt 124 lb (56.2 kg)   BMI 22.68 kg/m?   ? ?Physical Exam ?Vitals and nursing note reviewed.  ?Constitutional:   ?   Appearance: She is well-developed.  ?HENT:  ?   Head: Normocephalic and atraumatic.  ?Eyes:  ?   Conjunctiva/sclera: Conjunctivae normal.  ?Cardiovascular:  ?   Rate and Rhythm: Normal rate and regular rhythm.  ?   Heart sounds: Normal heart sounds.  ?Pulmonary:  ?  Effort: Pulmonary effort is normal.  ?   Breath sounds: Normal breath sounds.  ?Abdominal:  ?   General: Bowel sounds are normal.  ?   Palpations: Abdomen is soft.  ?Musculoskeletal:  ?   Cervical back: Normal range of motion.  ?Lymphadenopathy:  ?   Cervical: No cervical adenopathy.  ?Skin: ?   General: Skin is warm and dry.  ?   Capillary Refill: Capillary refill takes less than 2 seconds.  ?Neurological:  ?   Mental Status: She is alert and oriented to person, place, and time.  ?Psychiatric:     ?   Behavior: Behavior normal.  ?   ? ?Musculoskeletal Exam: C-spine was in good range of motion.  Shoulder joints, elbow elbow joints, wrist joints.  She has synovial thickening but no synovitis over MCPs.  Bilateral PIP and DIP thickening was noted.  She had right fourth trigger finger.  Hip joints and knee joints with good range of motion.  There was no tenderness over ankles or MTPs. ? ?CDAI Exam: ?CDAI Score: 0  ?Patient Global: 0 mm; Provider Global: 0 mm ?Swollen: 0 ; Tender: 0  ?Joint Exam 09/12/2021  ? ?No joint exam has been documented for this visit  ? ?There is currently no information documented on the homunculus. Go to the Rheumatology activity and complete the homunculus joint exam. ? ?Investigation: ?No additional findings. ? ?Imaging: ?No results found. ? ?Recent Labs: ?Lab Results  ?Component Value Date  ? WBC 7.7 08/09/2021  ? HGB 12.3 08/09/2021  ? PLT 293 08/09/2021  ? NA 132 (L) 08/09/2021  ? K 3.6 08/09/2021  ? CL 95 (L) 08/09/2021  ? CO2 31 08/09/2021  ? GLUCOSE 82 08/09/2021  ? BUN 21 08/09/2021  ? CREATININE 0.76 08/09/2021  ? BILITOT 0.5 08/09/2021  ? ALKPHOS 59 05/14/2016  ? AST 27 08/09/2021  ? ALT 18 08/09/2021  ? PROT 6.6 08/09/2021  ? ALBUMIN 4.5 05/14/2016  ? CALCIUM 9.5 08/09/2021  ? GFRAA 83 11/20/2020  ? QFTBGOLDPLUS NEGATIVE 05/15/2021  ? ? ?Speciality Comments: no baseline biologic labs ? ?Procedures:  ?No procedures performed ?Allergies: Patient has no known allergies.  ? ?Assessment / Plan:     ?Visit Diagnoses: Rheumatoid arthritis of multiple sites with negative rheumatoid factor ([place]) -she had no synovitis on examination.  She has been doing well on the combination of methotrexate and Cimzia injections.  I advised her to reduce methotrexate to 0.3 mL subcu weekly.  She has been taking folic acid 1 mg p.o. daily.  Plan: certolizumab pegol (CIMZIA) kit 400 mg.  It was given today. ? ?High risk medication use - In-office Cimzia 400 mg sq injection every 28 days, methotrexate 0.5 ml sq injections every 7 days,  and folic acid 1 mg 2 tablets daily.   Her labs obtained on August 09, 2021 were reviewed which were within normal limits.  She was advised to get labs every 3 months.  Information about immunization was provided.  She was also advised to hold Cimzia and methotrexate in case she develops an infection.  Annual skin examination to screen for skin cancer was advised. ? 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 ?Primary osteoarthritis of both hands-joint protection muscle strengthening was discussed. ? ?Trigger finger, right ring finger -she has recurrence of trigger finger.  We will schedule ultrasound-guided trigger finger injection. ? ?Primary osteoarthritis of both feet-she has overcrowding of toes.  No synovitis was noted. ? ?Osteopenia of multiple sites - DEXA is  not in [place].  According to the patient her gynecologist has ordered her bone densities in the past.  ? ?Dyslipidemia-dietary modifications were discussed. ? ?History of Clostridium difficile infection ? ?Orders: ?No orders of the defined types were placed in this encounter. ? ?Meds ordered this encounter  ?Medications  ? certolizumab pegol (CIMZIA) kit 400 mg  ? ? ? ?Follow-Up Instructions: Return in about 5 months (around 02/12/2022) for Rheumatoid arthritis, Osteoarthritis. ? ? ?Bo Merino, MD ? ?Note - This record has been created using [employer].  ?Chart creation errors have been sought, but may not always  ?have been located. Such creation errors do not reflect on  ?the standard of medical care.  ?

## 2021-09-06 ENCOUNTER — Ambulatory Visit: Payer: MEDICARE

## 2021-09-12 ENCOUNTER — Ambulatory Visit (INDEPENDENT_AMBULATORY_CARE_PROVIDER_SITE_OTHER): Payer: MEDICARE | Admitting: Rheumatology

## 2021-09-12 ENCOUNTER — Encounter: Payer: Self-pay | Admitting: Rheumatology

## 2021-09-12 VITALS — BP 136/72 | HR 70 | Resp 15 | Ht 62.0 in | Wt 124.0 lb

## 2021-09-12 DIAGNOSIS — M65341 Trigger finger, right ring finger: Secondary | ICD-10-CM

## 2021-09-12 DIAGNOSIS — M0609 Rheumatoid arthritis without rheumatoid factor, multiple sites: Secondary | ICD-10-CM | POA: Diagnosis not present

## 2021-09-12 DIAGNOSIS — H35389 Toxic maculopathy, unspecified eye: Secondary | ICD-10-CM | POA: Diagnosis not present

## 2021-09-12 DIAGNOSIS — M8589 Other specified disorders of bone density and structure, multiple sites: Secondary | ICD-10-CM

## 2021-09-12 DIAGNOSIS — M19041 Primary osteoarthritis, right hand: Secondary | ICD-10-CM

## 2021-09-12 DIAGNOSIS — Z79899 Other long term (current) drug therapy: Secondary | ICD-10-CM

## 2021-09-12 DIAGNOSIS — Z8619 Personal history of other infectious and parasitic diseases: Secondary | ICD-10-CM

## 2021-09-12 DIAGNOSIS — M19042 Primary osteoarthritis, left hand: Secondary | ICD-10-CM

## 2021-09-12 DIAGNOSIS — M19072 Primary osteoarthritis, left ankle and foot: Secondary | ICD-10-CM

## 2021-09-12 DIAGNOSIS — M19071 Primary osteoarthritis, right ankle and foot: Secondary | ICD-10-CM

## 2021-09-12 DIAGNOSIS — T372X5A Adverse effect of antimalarials and drugs acting on other blood protozoa, initial encounter: Secondary | ICD-10-CM

## 2021-09-12 DIAGNOSIS — E785 Hyperlipidemia, unspecified: Secondary | ICD-10-CM

## 2021-09-12 MED ORDER — CERTOLIZUMAB PEGOL 2 X 200 MG ~~LOC~~ KIT
400.0000 mg | PACK | Freq: Once | SUBCUTANEOUS | Status: AC
  Administered 2021-09-12: 400 mg via SUBCUTANEOUS

## 2021-09-12 NOTE — Patient Instructions (Signed)
Standing Labs ?We placed an order today for your standing lab work.  ? ?Please have your standing labs drawn in May and every 3 months ? ?If possible, please have your labs drawn 2 weeks prior to your appointment so that the provider can discuss your results at your appointment. ? ?Please note that you may see your imaging and lab results in [place] before we have reviewed them. ?We may be awaiting multiple results to interpret others before contacting you. ?Please allow our office up to 72 hours to thoroughly review all of the results before contacting the office for clarification of your results. ? ?We have open lab daily: ?Monday through Thursday from 1:30-4:30 PM and Friday from 1:30-4:00 PM ?at the office of Dr. Bo Merino, [facility].   ?Please be advised, all patients with office appointments requiring lab work will take precedent over walk-in lab work.  ?If possible, please come for your lab work on Monday and Friday afternoons, as you may experience shorter wait times. ?The office is located at [address] [place], [place], [place] [address] ?No appointment is necessary.   ?Labs are drawn by [facility]. Please bring your co-pay at the time of your lab draw.  You may receive a bill from [facility] for your lab work. ? ?Please note if you are on Hydroxychloroquine and and an order has been placed for a Hydroxychloroquine level, you will need to have it drawn 4 hours or more after your last dose. ? ?If you wish to have your labs drawn at another location, please call the office 24 hours in advance to send orders. ? ?If you have any questions regarding directions or hours of operation,  ?please call [phone].   ?As a reminder, please drink plenty of water prior to coming for your lab work. Thanks!  ? ?Vaccines ?You are taking a medication(s) that can suppress your immune system.  The following immunizations are recommended: ?Flu annually ?Covid-19  ?Td/Tdap (tetanus, diphtheria, pertussis)  every 10 years ?Pneumonia (Prevnar 15 then Pneumovax 23 at least 1 year apart.  Alternatively, can take Prevnar 20 without needing additional dose) ?Shingrix: 2 doses from 4 weeks to 6 months apart ? ?Please check with your PCP to make sure you are up to date.  ? ?If you have signs or symptoms of an infection or start antibiotics: ?First, call your PCP for workup of your infection. ?Hold your medication through the infection, until you complete your antibiotics, and until symptoms resolve if you take the following: ?Injectable medication (Actemra, Benlysta, Cimzia, Cosentyx, Enbrel, Humira, Kevzara, Orencia, Remicade, Simponi, [place], [place], [place]) ?Methotrexate ?Leflunomide Jolee Ewing) ?Mycophenolate (Cellcept) ?Roma Kayser, or Rinvoq  ?Please  get annual skin examination to screen for skin cancer while you are on Cimzia. ?

## 2021-09-12 NOTE — Progress Notes (Signed)
Subjective:   ?Patient presents to clinic today to receive monthly dose of Cimzia. ? ?Patient running a fever or have signs/symptoms of infection? No ? ?Patient currently on antibiotics for the treatment of infection? No ? ?Patient have any upcoming invasive procedures/surgeries? No ? ?Objective: ?CMP  ?   ?Component Value Date/Time  ? NA 132 (L) 08/09/2021 1442  ? NA 138 02/20/2016 0000  ? K 3.6 08/09/2021 1442  ? CL 95 (L) 08/09/2021 1442  ? CO2 31 08/09/2021 1442  ? GLUCOSE 82 08/09/2021 1442  ? BUN 21 08/09/2021 1442  ? BUN 13 02/20/2016 0000  ? CREATININE 0.76 08/09/2021 1442  ? CALCIUM 9.5 08/09/2021 1442  ? PROT 6.6 08/09/2021 1442  ? ALBUMIN 4.5 05/14/2016 1314  ? AST 27 08/09/2021 1442  ? ALT 18 08/09/2021 1442  ? ALKPHOS 59 05/14/2016 1314  ? BILITOT 0.5 08/09/2021 1442  ? GFRNONAA 72 11/20/2020 1401  ? GFRAA 83 11/20/2020 1401  ? ? ?CBC ?   ?Component Value Date/Time  ? WBC 7.7 08/09/2021 1442  ? RBC 3.90 08/09/2021 1442  ? HGB 12.3 08/09/2021 1442  ? HCT 36.1 08/09/2021 1442  ? PLT 293 08/09/2021 1442  ? MCV 92.6 08/09/2021 1442  ? MCH 31.5 08/09/2021 1442  ? MCHC 34.1 08/09/2021 1442  ? RDW 13.6 08/09/2021 1442  ? LYMPHSABS 1,887 08/09/2021 1442  ? MONOABS 434 05/14/2016 1314  ? EOSABS 39 08/09/2021 1442  ? BASOSABS 62 08/09/2021 1442  ? ? ?Baseline Immunosuppressant Therapy Labs ?TB GOLD ? ?  Latest Ref Rng & Units 05/15/2021  ?  1:45 PM  ?Quantiferon TB Gold  ?Quantiferon TB Gold Plus NEGATIVE NEGATIVE    ? ?Hepatitis Panel ? ?  Latest Ref Rng & Units 03/05/2018  ?  1:53 PM  ?Hepatitis  ?Hep B Surface Ag NON-REACTI NON-REACTIVE    ?Hep B IgM NON-REACTI NON-REACTIVE    ?Hep C Ab NON-REACTI NON-REACTIVE    ? ?HIV ?Lab Results  ?Component Value Date  ? HIV NON-REACTIVE 03/05/2018  ? ?Immunoglobulins ? ?  Latest Ref Rng & Units 03/05/2018  ?  1:53 PM  ?Immunoglobulin Electrophoresis  ?IgA  20 - 320 mg/dL 119    ?IgG 600 - 1,540 mg/dL 844    ?IgM 50 - 300 mg/dL 156    ? ?SPEP ? ?  Latest Ref Rng & Units  08/09/2021  ?  2:42 PM  ?Serum Protein Electrophoresis  ?Total Protein 6.1 - 8.1 g/dL 6.6    ? ?G6PD ?Lab Results  ?Component Value Date  ? G6PDH 17.9 11/19/2017  ? ?TPMT ?No results found for: TPMT  ? ?Chest x-ray:  10/2005 ? ?Assessment/Plan:  ? ?Administrations This Visit   ? ? certolizumab pegol (CIMZIA) kit 400 mg   ? ? Admin Date ?09/12/2021 Action ?Given Dose ?400 mg Route ?Subcutaneous Administered By ?Carole Binning, LPN  ? ?  ?  ? ?  ?  ? ?Patient tolerated injection well.  ? ?Appointment for next injection scheduled for 09/10/2021.  Patient due for labs in May 2023.  Patient is to call and reschedule appointment if running a fever with signs/symptoms of infection, on antibiotics for active infection or has an upcoming invasive procedure. ? ?All questions encouraged and answered.  Instructed patient to call with any further questions or concerns. ?  ?

## 2021-09-14 ENCOUNTER — Telehealth: Payer: Self-pay | Admitting: Pharmacist

## 2021-09-14 NOTE — Telephone Encounter (Signed)
Received notification from billing dept that they authorization for Cimiza may have expired. ? ?The patient has Medicare A/B and [employer] supplemental plan is effective as of 06/17/2018. The plan follows Medicare guidelines. Supplement covers anything Medicare covers. No prior authorization not required. Supplement does not cover deductible of $226. Coinsurance/copay is covered 100% after deductible is met. ? ?Ref # [id number] ? ?Submitted case renewal on [employer] portal for [employer] ? ?Knox Saliva, PharmD, MPH, BCPS ?Clinical Pharmacist (Rheumatology and Pulmonology) ?

## 2021-09-14 NOTE — Telephone Encounter (Signed)
[employer] verification of benefits received. Sent to media tab of patient's chart ? ?Knox Saliva, PharmD, MPH, BCPS ?Clinical Pharmacist (Rheumatology and Pulmonology) ?

## 2021-09-24 ENCOUNTER — Other Ambulatory Visit: Payer: Self-pay | Admitting: Physician Assistant

## 2021-09-24 NOTE — Telephone Encounter (Signed)
Next Visit: 01/23/2022 ? ?Last Visit: 09/12/2021 ? ?Last Fill: 07/09/2021 ? 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 ?Current Dose per office note 09/12/2021: methotrexate 0.5 ml sq injections every 7 days ? ?Labs: 08/09/2020, CBC is normal.  CMP showed low sodium and low potassium ? ?Okay to refill MTX? ? ?

## 2021-10-11 ENCOUNTER — Other Ambulatory Visit: Payer: MEDICARE | Admitting: Rheumatology

## 2021-10-12 ENCOUNTER — Telehealth: Payer: Self-pay | Admitting: Rheumatology
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
## 2021-10-24 ENCOUNTER — Ambulatory Visit (INDEPENDENT_AMBULATORY_CARE_PROVIDER_SITE_OTHER): Payer: MEDICARE | Admitting: Rheumatology

## 2021-10-24 ENCOUNTER — Ambulatory Visit: Payer: Self-pay

## 2021-10-24 VITALS — BP 168/83 | HR 76

## 2021-10-24 DIAGNOSIS — M0609 Rheumatoid arthritis without rheumatoid factor, multiple sites: Secondary | ICD-10-CM

## 2021-10-24 DIAGNOSIS — Z79899 Other long term (current) drug therapy: Secondary | ICD-10-CM

## 2021-10-24 DIAGNOSIS — M65341 Trigger finger, right ring finger: Secondary | ICD-10-CM

## 2021-10-24 MED ORDER — LIDOCAINE HCL 1 % IJ SOLN
0.5000 mL | INTRAMUSCULAR | Status: AC | PRN
  Administered 2021-10-24: .5 mL

## 2021-10-24 MED ORDER — TRIAMCINOLONE ACETONIDE 40 MG/ML IJ SUSP
10.0000 mg | INTRAMUSCULAR | Status: AC | PRN
  Administered 2021-10-24: 10 mg

## 2021-10-24 MED ORDER — CERTOLIZUMAB PEGOL 2 X 200 MG ~~LOC~~ KIT
400.0000 mg | PACK | Freq: Once | SUBCUTANEOUS | Status: AC
  Administered 2021-10-24: 400 mg via SUBCUTANEOUS

## 2021-10-24 NOTE — Progress Notes (Signed)
Pharmacy Note ? ?Subjective:   ?Patient presents to clinic today to receive monthly dose of Cimzia. ? ?Patient running a fever or have signs/symptoms of infection? No ? ?Patient currently on antibiotics for the treatment of infection? No ? ?Patient have any upcoming invasive procedures/surgeries? No ? ?Objective: ?CMP  ?   ?Component Value Date/Time  ? NA 132 (L) 08/09/2021 1442  ? NA 138 02/20/2016 0000  ? K 3.6 08/09/2021 1442  ? CL 95 (L) 08/09/2021 1442  ? CO2 31 08/09/2021 1442  ? GLUCOSE 82 08/09/2021 1442  ? BUN 21 08/09/2021 1442  ? BUN 13 02/20/2016 0000  ? CREATININE 0.76 08/09/2021 1442  ? CALCIUM 9.5 08/09/2021 1442  ? PROT 6.6 08/09/2021 1442  ? ALBUMIN 4.5 05/14/2016 1314  ? AST 27 08/09/2021 1442  ? ALT 18 08/09/2021 1442  ? ALKPHOS 59 05/14/2016 1314  ? BILITOT 0.5 08/09/2021 1442  ? GFRNONAA 72 11/20/2020 1401  ? GFRAA 83 11/20/2020 1401  ? ? ?CBC ?   ?Component Value Date/Time  ? WBC 7.7 08/09/2021 1442  ? RBC 3.90 08/09/2021 1442  ? HGB 12.3 08/09/2021 1442  ? HCT 36.1 08/09/2021 1442  ? PLT 293 08/09/2021 1442  ? MCV 92.6 08/09/2021 1442  ? MCH 31.5 08/09/2021 1442  ? MCHC 34.1 08/09/2021 1442  ? RDW 13.6 08/09/2021 1442  ? LYMPHSABS 1,887 08/09/2021 1442  ? MONOABS 434 05/14/2016 1314  ? EOSABS 39 08/09/2021 1442  ? BASOSABS 62 08/09/2021 1442  ? ? ?Baseline Immunosuppressant Therapy Labs ?TB GOLD ? ?  Latest Ref Rng & Units 05/15/2021  ?  1:45 PM  ?Quantiferon TB Gold  ?Quantiferon TB Gold Plus NEGATIVE NEGATIVE    ? ?Hepatitis Panel ? ?  Latest Ref Rng & Units 03/05/2018  ?  1:53 PM  ?Hepatitis  ?Hep B Surface Ag NON-REACTI NON-REACTIVE    ?Hep B IgM NON-REACTI NON-REACTIVE    ?Hep C Ab NON-REACTI NON-REACTIVE    ? ?HIV ?Lab Results  ?Component Value Date  ? HIV NON-REACTIVE 03/05/2018  ? ?Immunoglobulins ? ?  Latest Ref Rng & Units 03/05/2018  ?  1:53 PM  ?Immunoglobulin Electrophoresis  ?IgA  20 - 320 mg/dL 119    ?IgG 600 - 1,540 mg/dL 844    ?IgM 50 - 300 mg/dL 156    ? ?SPEP ? ?  Latest Ref  Rng & Units 08/09/2021  ?  2:42 PM  ?Serum Protein Electrophoresis  ?Total Protein 6.1 - 8.1 g/dL 6.6    ? ?G6PD ?Lab Results  ?Component Value Date  ? G6PDH 17.9 11/19/2017  ? ?TPMT ?No results found for: TPMT  ? ?Chest x-ray: 10/2005 ? ?Assessment/Plan:  ? ?Administrations This Visit   ? ? certolizumab pegol (CIMZIA) kit 400 mg   ? ? Admin Date ?10/24/2021 Action ?Given Dose ?400 mg Route ?Subcutaneous Administered By ?Carole Binning, LPN  ? ?  ?  ? ?  ?  ? ?Patient tolerated injection well.  ? ?Appointment for next injection scheduled for 11/21/2021.  Patient due for labs in May 2023 and is having them drawn in office today.  Patient is to call and reschedule appointment if running a fever with signs/symptoms of infection, on antibiotics for active infection or has an upcoming invasive procedure. ? ?All questions encouraged and answered.  Instructed patient to call with any further questions or concerns. ?  ?

## 2021-10-24 NOTE — Progress Notes (Signed)
? ?  Procedure Note ? ?Patient: April Shepard             ?Date of Birth: [birth year]           ?MRN: [id number]             ?Visit Date: 10/24/2021 ? ?Procedures: ?Visit Diagnoses:  ?1. Trigger finger, right ring finger   ?2. Rheumatoid arthritis of multiple sites with negative rheumatoid factor (HCC)   ?3. High risk medication use   ? ? ?Hand/UE Inj: R ring A1 for trigger finger on 10/24/2021 3:20 PM ?Indications: pain, tendon swelling and therapeutic ?Details: 27 G needle, ultrasound-guided volar approach ?Medications: 0.5 mL lidocaine 1 %; 10 mg triamcinolone acetonide 40 MG/ML ?Aspirate: 0 mL ?Procedure, treatment alternatives, risks and benefits explained, specific risks discussed. Immediately prior to procedure a time out was called to verify the correct patient, procedure, equipment, support staff and site/side marked as required. Patient was prepped and draped in the usual sterile fashion.  ? ? ? ?Patient tolerated the procedure well.  Postprocedure instructions were given. ? ?Pollyann Savoy, MD  ? ? ? ?

## 2021-10-25 LAB — COMPLETE METABOLIC PANEL WITH GFR
AG Ratio: 2 (calc) (ref 1.0–2.5)
ALT: 17 U/L (ref 6–29)
AST: 27 U/L (ref 10–35)
Albumin: 4.3 g/dL (ref 3.6–5.1)
Alkaline phosphatase (APISO): 109 U/L (ref 37–153)
BUN: 10 mg/dL (ref 7–25)
CO2: 29 mmol/L (ref 20–32)
Calcium: 9.6 mg/dL (ref 8.6–10.4)
Chloride: 95 mmol/L — ABNORMAL LOW (ref 98–110)
Creat: 0.64 mg/dL (ref 0.60–0.95)
Globulin: 2.1 g/dL (calc) (ref 1.9–3.7)
Glucose, Bld: 98 mg/dL (ref 65–99)
Potassium: 3.5 mmol/L (ref 3.5–5.3)
Sodium: 134 mmol/L — ABNORMAL LOW (ref 135–146)
Total Bilirubin: 0.6 mg/dL (ref 0.2–1.2)
Total Protein: 6.4 g/dL (ref 6.1–8.1)
eGFR: 89 mL/min/{1.73_m2} (ref >=60)

## 2021-10-25 LAB — CBC WITH DIFFERENTIAL/PLATELET
Absolute Monocytes: 468 cells/uL (ref 200–950)
Basophils Absolute: 59 cells/uL (ref 0–200)
Basophils Relative: 0.9 %
Eosinophils Absolute: 39 cells/uL (ref 15–500)
Eosinophils Relative: 0.6 %
HCT: 35.9 % (ref 35.0–45.0)
Hemoglobin: 12.4 g/dL (ref 11.7–15.5)
Lymphs Abs: 1801 cells/uL (ref 850–3900)
MCH: 32.1 pg (ref 27.0–33.0)
MCHC: 34.5 g/dL (ref 32.0–36.0)
MCV: 93 fL (ref 80.0–100.0)
MPV: 8.4 fL (ref 7.5–12.5)
Monocytes Relative: 7.2 %
Neutro Abs: 4134 cells/uL (ref 1500–7800)
Neutrophils Relative %: 63.6 %
Platelets: 266 10*3/uL (ref 140–400)
RBC: 3.86 10*6/uL (ref 3.80–5.10)
RDW: 12.8 % (ref 11.0–15.0)
Total Lymphocyte: 27.7 %
WBC: 6.5 10*3/uL (ref 3.8–10.8)

## 2021-10-25 NOTE — Progress Notes (Signed)
CBC WNL. Sodium and chloride are borderline low but stable. Rest of CMP WNL.

## 2021-11-22 ENCOUNTER — Ambulatory Visit (INDEPENDENT_AMBULATORY_CARE_PROVIDER_SITE_OTHER): Payer: MEDICARE | Admitting: *Deleted

## 2021-11-22 VITALS — BP 147/79 | HR 74

## 2021-11-22 DIAGNOSIS — M0609 Rheumatoid arthritis without rheumatoid factor, multiple sites: Secondary | ICD-10-CM

## 2021-11-22 MED ORDER — CERTOLIZUMAB PEGOL 2 X 200 MG ~~LOC~~ KIT
400.0000 mg | PACK | Freq: Once | SUBCUTANEOUS | Status: AC
  Administered 2021-11-22: 400 mg via SUBCUTANEOUS

## 2021-11-22 NOTE — Progress Notes (Signed)
Pharmacy Note  Subjective:   Patient presents to clinic today to receive monthly dose of Cimzia.  Patient running a fever or have signs/symptoms of infection? No  Patient currently on antibiotics for the treatment of infection? No  Patient have any upcoming invasive procedures/surgeries? No  Objective: CMP     Component Value Date/Time   NA 134 (L) 10/24/2021 1519   NA 138 02/20/2016 0000   K 3.5 10/24/2021 1519   CL 95 (L) 10/24/2021 1519   CO2 29 10/24/2021 1519   GLUCOSE 98 10/24/2021 1519   BUN 10 10/24/2021 1519   BUN 13 02/20/2016 0000   CREATININE 0.64 10/24/2021 1519   CALCIUM 9.6 10/24/2021 1519   PROT 6.4 10/24/2021 1519   ALBUMIN 4.5 05/14/2016 1314   AST 27 10/24/2021 1519   ALT 17 10/24/2021 1519   ALKPHOS 59 05/14/2016 1314   BILITOT 0.6 10/24/2021 1519   GFRNONAA 72 11/20/2020 1401   GFRAA 83 11/20/2020 1401    CBC    Component Value Date/Time   WBC 6.5 10/24/2021 1519   RBC 3.86 10/24/2021 1519   HGB 12.4 10/24/2021 1519   HCT 35.9 10/24/2021 1519   PLT 266 10/24/2021 1519   MCV 93.0 10/24/2021 1519   MCH 32.1 10/24/2021 1519   MCHC 34.5 10/24/2021 1519   RDW 12.8 10/24/2021 1519   LYMPHSABS 1,801 10/24/2021 1519   MONOABS 434 05/14/2016 1314   EOSABS 39 10/24/2021 1519   BASOSABS 59 10/24/2021 1519    Baseline Immunosuppressant Therapy Labs TB GOLD    Latest Ref Rng & Units 05/15/2021    1:45 PM  Quantiferon TB Gold  Quantiferon TB Gold Plus NEGATIVE NEGATIVE    Hepatitis Panel    Latest Ref Rng & Units 03/05/2018    1:53 PM  Hepatitis  Hep B Surface Ag NON-REACTI NON-REACTIVE   Hep B IgM NON-REACTI NON-REACTIVE   Hep C Ab NON-REACTI NON-REACTIVE    HIV Lab Results  Component Value Date   HIV NON-REACTIVE 03/05/2018   Immunoglobulins    Latest Ref Rng & Units 03/05/2018    1:53 PM  Immunoglobulin Electrophoresis  IgA  20 - 320 mg/dL 119   IgG 600 - 1,540 mg/dL 844   IgM 50 - 300 mg/dL 156    SPEP    Latest Ref Rng &  Units 10/24/2021    3:19 PM  Serum Protein Electrophoresis  Total Protein 6.1 - 8.1 g/dL 6.4    G6PD Lab Results  Component Value Date   G6PDH 17.9 11/19/2017   TPMT No results found for: "TPMT"   Chest x-ray:  10/2005  Assessment/Plan:   Administrations This Visit     certolizumab pegol (CIMZIA) kit 400 mg     Admin Date 11/22/2021 Action Given Dose 400 mg Route Subcutaneous Administered By Carole Binning, LPN             Patient tolerated injection well .   Appointment for next injection scheduled for December 20, 2021.  Patient due for labs in August 2023.  Patient is to call and reschedule appointment if running a fever with signs/symptoms of infection, on antibiotics for active infection or has an upcoming invasive procedure.  All questions encouraged and answered.  Instructed patient to call with any further questions or concerns.

## 2021-12-20 ENCOUNTER — Other Ambulatory Visit: Payer: Self-pay | Admitting: *Deleted

## 2021-12-20 ENCOUNTER — Ambulatory Visit (INDEPENDENT_AMBULATORY_CARE_PROVIDER_SITE_OTHER): Payer: MEDICARE | Admitting: *Deleted

## 2021-12-20 ENCOUNTER — Ambulatory Visit: Payer: MEDICARE

## 2021-12-20 VITALS — BP 138/80 | HR 71

## 2021-12-20 DIAGNOSIS — M0609 Rheumatoid arthritis without rheumatoid factor, multiple sites: Secondary | ICD-10-CM | POA: Diagnosis not present

## 2021-12-20 MED ORDER — PREDNISONE 5 MG PO TABS: ORAL_TABLET | ORAL | 0 refills | Status: DC

## 2021-12-20 MED ORDER — CERTOLIZUMAB PEGOL 2 X 200 MG ~~LOC~~ KIT
400.0000 mg | PACK | Freq: Once | SUBCUTANEOUS | Status: AC
  Administered 2021-12-20: 400 mg via SUBCUTANEOUS
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
## 2021-12-20 NOTE — Telephone Encounter (Signed)
Please send a prednisone taper starting at 20 mg and taper by 5 mg every 4 days.  Please advise patient that prednisone increases the risk of hypertension, diabetes, osteoporosis and weight gain.

## 2021-12-20 NOTE — Telephone Encounter (Signed)
Left message to advise patient a prednisone taper has been sent to the pharmacy.  Advised patient that prednisone increases the risk of hypertension, diabetes, osteoporosis and weight gain.

## 2021-12-20 NOTE — Progress Notes (Signed)
Subjective:   Patient presents to clinic today to receive monthly dose of Cimzia.  Patient running a fever or have signs/symptoms of infection? No  Patient currently on antibiotics for the treatment of infection? No  Patient have any upcoming invasive procedures/surgeries? No  Objective: CMP     Component Value Date/Time   NA 134 (L) 10/24/2021 1519   NA 138 02/20/2016 0000   K 3.5 10/24/2021 1519   CL 95 (L) 10/24/2021 1519   CO2 29 10/24/2021 1519   GLUCOSE 98 10/24/2021 1519   BUN 10 10/24/2021 1519   BUN 13 02/20/2016 0000   CREATININE 0.64 10/24/2021 1519   CALCIUM 9.6 10/24/2021 1519   PROT 6.4 10/24/2021 1519   ALBUMIN 4.5 05/14/2016 1314   AST 27 10/24/2021 1519   ALT 17 10/24/2021 1519   ALKPHOS 59 05/14/2016 1314   BILITOT 0.6 10/24/2021 1519   GFRNONAA 72 11/20/2020 1401   GFRAA 83 11/20/2020 1401    CBC    Component Value Date/Time   WBC 6.5 10/24/2021 1519   RBC 3.86 10/24/2021 1519   HGB 12.4 10/24/2021 1519   HCT 35.9 10/24/2021 1519   PLT 266 10/24/2021 1519   MCV 93.0 10/24/2021 1519   MCH 32.1 10/24/2021 1519   MCHC 34.5 10/24/2021 1519   RDW 12.8 10/24/2021 1519   LYMPHSABS 1,801 10/24/2021 1519   MONOABS 434 05/14/2016 1314   EOSABS 39 10/24/2021 1519   BASOSABS 59 10/24/2021 1519    Baseline Immunosuppressant Therapy Labs TB GOLD    Latest Ref Rng & Units 05/15/2021    1:45 PM  Quantiferon TB Gold  Quantiferon TB Gold Plus NEGATIVE NEGATIVE    Hepatitis Panel    Latest Ref Rng & Units 03/05/2018    1:53 PM  Hepatitis  Hep B Surface Ag NON-REACTI NON-REACTIVE   Hep B IgM NON-REACTI NON-REACTIVE   Hep C Ab NON-REACTI NON-REACTIVE    HIV Lab Results  Component Value Date   HIV NON-REACTIVE 03/05/2018   Immunoglobulins    Latest Ref Rng & Units 03/05/2018    1:53 PM  Immunoglobulin Electrophoresis  IgA  20 - 320 mg/dL 119   IgG 600 - 1,540 mg/dL 844   IgM 50 - 300 mg/dL 156    SPEP    Latest Ref Rng & Units 10/24/2021     3:19 PM  Serum Protein Electrophoresis  Total Protein 6.1 - 8.1 g/dL 6.4    G6PD Lab Results  Component Value Date   G6PDH 17.9 11/19/2017   TPMT No results found for: "TPMT"   Chest x-ray:  10/2005  Assessment/Plan:   Administrations This Visit     certolizumab pegol (CIMZIA) kit 400 mg     Admin Date 12/20/2021 Action Given Dose 400 mg Route Subcutaneous Administered By Carole Binning, LPN             Patient tolerated injection well.   Appointment for next injection scheduled for January 17, 2022.  Patient due for labs in August 2023.  Patient is to call and reschedule appointment if running a fever with signs/symptoms of infection, on antibiotics for active infection or has an upcoming invasive procedure.  All questions encouraged and answered.  Instructed patient to call with any further questions or concerns.

## 2021-12-27 ENCOUNTER — Other Ambulatory Visit: Payer: Self-pay | Admitting: Physician Assistant

## 2021-12-28 NOTE — Telephone Encounter (Signed)
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 Sodium and chloride are borderline low but stable. Rest of CMP WNL.   Okay to refill MTX?

## 2022-01-16 ENCOUNTER — Other Ambulatory Visit: Payer: Self-pay | Admitting: Rheumatology

## 2022-01-16 MED ORDER — FOLIC ACID 1 MG PO TABS: 2.0000 mg | ORAL_TABLET | Freq: Every day | ORAL | 2 refills | Status: DC

## 2022-01-16 NOTE — Telephone Encounter (Signed)
Next Visit: 02/12/2022  Last Visit: 09/12/2021  Last Fill: 05/15/2021:  Dx: Rheumatoid arthritis of multiple sites with negative rheumatoid factor   Current Dose per office note on 09/12/2021:  folic acid 1 mg 2 tablets daily  Okay to refill Folic Acid?

## 2022-01-16 NOTE — Telephone Encounter (Signed)
Patient called the office requesting a refill of Folic acid 1mg  be sent to [facility] in [place].

## 2022-01-17 ENCOUNTER — Ambulatory Visit: Payer: MEDICARE | Attending: Rheumatology | Admitting: *Deleted

## 2022-01-17 VITALS — BP 130/67 | HR 77

## 2022-01-17 DIAGNOSIS — Z79899 Other long term (current) drug therapy: Secondary | ICD-10-CM

## 2022-01-17 DIAGNOSIS — M0609 Rheumatoid arthritis without rheumatoid factor, multiple sites: Secondary | ICD-10-CM | POA: Diagnosis present

## 2022-01-17 MED ORDER — CERTOLIZUMAB PEGOL 2 X 200 MG ~~LOC~~ KIT
400.0000 mg | PACK | SUBCUTANEOUS | Status: AC
  Administered 2022-01-17: 400 mg via SUBCUTANEOUS

## 2022-01-17 NOTE — Progress Notes (Signed)
Subjective:   Patient presents to clinic today to receive monthly dose of Cimzia.  Patient running a fever or have signs/symptoms of infection? No  Patient currently on antibiotics for the treatment of infection? No  Patient have any upcoming invasive procedures/surgeries? No  Objective: CMP     Component Value Date/Time   NA 134 (L) 10/24/2021 1519   NA 138 02/20/2016 0000   K 3.5 10/24/2021 1519   CL 95 (L) 10/24/2021 1519   CO2 29 10/24/2021 1519   GLUCOSE 98 10/24/2021 1519   BUN 10 10/24/2021 1519   BUN 13 02/20/2016 0000   CREATININE 0.64 10/24/2021 1519   CALCIUM 9.6 10/24/2021 1519   PROT 6.4 10/24/2021 1519   ALBUMIN 4.5 05/14/2016 1314   AST 27 10/24/2021 1519   ALT 17 10/24/2021 1519   ALKPHOS 59 05/14/2016 1314   BILITOT 0.6 10/24/2021 1519   GFRNONAA 72 11/20/2020 1401   GFRAA 83 11/20/2020 1401    CBC    Component Value Date/Time   WBC 6.5 10/24/2021 1519   RBC 3.86 10/24/2021 1519   HGB 12.4 10/24/2021 1519   HCT 35.9 10/24/2021 1519   PLT 266 10/24/2021 1519   MCV 93.0 10/24/2021 1519   MCH 32.1 10/24/2021 1519   MCHC 34.5 10/24/2021 1519   RDW 12.8 10/24/2021 1519   LYMPHSABS 1,801 10/24/2021 1519   MONOABS 434 05/14/2016 1314   EOSABS 39 10/24/2021 1519   BASOSABS 59 10/24/2021 1519    Baseline Immunosuppressant Therapy Labs TB GOLD    Latest Ref Rng & Units 05/15/2021    1:45 PM  Quantiferon TB Gold  Quantiferon TB Gold Plus NEGATIVE NEGATIVE    Hepatitis Panel    Latest Ref Rng & Units 03/05/2018    1:53 PM  Hepatitis  Hep B Surface Ag NON-REACTI NON-REACTIVE   Hep B IgM NON-REACTI NON-REACTIVE   Hep C Ab NON-REACTI NON-REACTIVE    HIV Lab Results  Component Value Date   HIV NON-REACTIVE 03/05/2018   Immunoglobulins    Latest Ref Rng & Units 03/05/2018    1:53 PM  Immunoglobulin Electrophoresis  IgA  20 - 320 mg/dL 119   IgG 600 - 1,540 mg/dL 844   IgM 50 - 300 mg/dL 156    SPEP    Latest Ref Rng & Units 10/24/2021     3:19 PM  Serum Protein Electrophoresis  Total Protein 6.1 - 8.1 g/dL 6.4    G6PD Lab Results  Component Value Date   G6PDH 17.9 11/19/2017   TPMT No results found for: "TPMT"   Chest x-ray: 10/2005  Assessment/Plan:   Administrations This Visit     certolizumab pegol (CIMZIA) kit 400 mg     Admin Date 01/17/2022 Action Given Dose 400 mg Route Subcutaneous Administered By Carole Binning, LPN             Patient tolerated injection well.   Appointment for next injection scheduled for 02/14/2022.  Patient due for labs in August 2023 and had them drawn while in office.  Patient is to call and reschedule appointment if running a fever with signs/symptoms of infection, on antibiotics for active infection or has an upcoming invasive procedure.  All questions encouraged and answered.  Instructed patient to call with any further questions or concerns.

## 2022-01-18 LAB — COMPLETE METABOLIC PANEL WITH GFR
AG Ratio: 2 (calc) (ref 1.0–2.5)
ALT: 16 U/L (ref 6–29)
AST: 28 U/L (ref 10–35)
Albumin: 4.1 g/dL (ref 3.6–5.1)
Alkaline phosphatase (APISO): 85 U/L (ref 37–153)
BUN: 14 mg/dL (ref 7–25)
CO2: 26 mmol/L (ref 20–32)
Calcium: 9.4 mg/dL (ref 8.6–10.4)
Chloride: 96 mmol/L — ABNORMAL LOW (ref 98–110)
Creat: 0.73 mg/dL (ref 0.60–0.95)
Globulin: 2.1 g/dL (calc) (ref 1.9–3.7)
Glucose, Bld: 100 mg/dL — ABNORMAL HIGH (ref 65–99)
Potassium: 3.8 mmol/L (ref 3.5–5.3)
Sodium: 132 mmol/L — ABNORMAL LOW (ref 135–146)
Total Bilirubin: 0.5 mg/dL (ref 0.2–1.2)
Total Protein: 6.2 g/dL (ref 6.1–8.1)
eGFR: 83 mL/min/{1.73_m2} (ref >=60)

## 2022-01-18 LAB — CBC WITH DIFFERENTIAL/PLATELET
Absolute Monocytes: 483 cells/uL (ref 200–950)
Basophils Absolute: 48 cells/uL (ref 0–200)
Basophils Relative: 0.7 %
Eosinophils Absolute: 109 cells/uL (ref 15–500)
Eosinophils Relative: 1.6 %
HCT: 34.6 % — ABNORMAL LOW (ref 35.0–45.0)
Hemoglobin: 12.1 g/dL (ref 11.7–15.5)
Lymphs Abs: 1979 cells/uL (ref 850–3900)
MCH: 33.9 pg — ABNORMAL HIGH (ref 27.0–33.0)
MCHC: 35 g/dL (ref 32.0–36.0)
MCV: 96.9 fL (ref 80.0–100.0)
MPV: 8.4 fL (ref 7.5–12.5)
Monocytes Relative: 7.1 %
Neutro Abs: 4182 cells/uL (ref 1500–7800)
Neutrophils Relative %: 61.5 %
Platelets: 270 10*3/uL (ref 140–400)
RBC: 3.57 10*6/uL — ABNORMAL LOW (ref 3.80–5.10)
RDW: 13.2 % (ref 11.0–15.0)
Total Lymphocyte: 29.1 %
WBC: 6.8 10*3/uL (ref 3.8–10.8)

## 2022-01-18 NOTE — Progress Notes (Signed)
Sodium and chloride are low and stable.  CBC is normal.  Please forward results to her PCP.

## 2022-02-06 NOTE — Progress Notes (Signed)
Office Visit Note  Patient: April Shepard             Date of Birth: Aug 07, 1940           MRN: [id number]             PCP: Artis Delay, MD Referring: Artis Delay, MD Visit Date: 02/20/2022 Occupation: '@[email or web address]' @  Subjective:  Medication monitoring   History of Present Illness: April Shepard is a 81 y.o. female with history of seronegative rheumatoid arthritis and osteoarthritis.  She remains on in-office Cimzia 400 mg sq injection every 28 days, methotrexate 0.5 ml sq injections every 7 days, and folic acid 1 mg 2 tablets daily.  She received her in office Cimzia injection today.  She continues to tolerate Cimzia and methotrexate without any side effects or injection site reactions.  Patient reports that in early July she had a rheumatoid arthritis flare involving her hands and right knee.  She took a prednisone taper starting at 20 mg tapering by 5 mg every 4 days which alleviated her flare.  She has not had any recurrence of symptoms.  She denies any morning stiffness or nocturnal pain at this time.  She denies any difficulty with ADLs. She denies any recent or recurrent infections.  She denies any new medical conditions.    Activities of Daily Living:  Patient reports morning stiffness for 0 minutes.   Patient Denies nocturnal pain.  Difficulty dressing/grooming: Denies Difficulty climbing stairs: Denies Difficulty getting out of chair: Denies Difficulty using hands for taps, buttons, cutlery, and/or writing: Denies  Review of Systems  Constitutional:  Negative for fatigue.  HENT:  Negative for mouth sores and mouth dryness.   Eyes:  Negative for dryness.  Respiratory:  Negative for shortness of breath.   Cardiovascular:  Negative for chest pain and palpitations.  Gastrointestinal:  Negative for blood in stool, constipation and diarrhea.  Endocrine: Negative for increased urination.  Genitourinary:  Negative for involuntary urination.  Musculoskeletal:   Negative for joint pain, gait problem, joint pain, joint swelling, myalgias, muscle weakness, morning stiffness, muscle tenderness and myalgias.  Skin:  Negative for color change, rash, hair loss and sensitivity to sunlight.  Allergic/Immunologic: Negative for susceptible to infections.  Neurological:  Positive for numbness. Negative for dizziness and headaches.  Hematological:  Negative for swollen glands.  Psychiatric/Behavioral:  Negative for depressed mood and sleep disturbance. The patient is not nervous/anxious.     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   High risk medication use 10/22/2016   Trigger finger, right ring finger 10/22/2016   Osteopenia 05/13/2016   Osteoarthritis of both hands 05/13/2016   RA (rheumatoid arthritis) ([place]) 05/13/2016    Past Medical History:  Diagnosis Date   Osteoarthritis of both hands 05/13/2016   Osteopenia 05/13/2016   RA (rheumatoid arthritis) [place]) 05/13/2016    Family History  Problem Relation Age of Onset   Breast cancer Mother    Heart attack Father    Melanoma Brother    Past Surgical History:  Procedure Laterality Date   CHOLECYSTECTOMY     Social History   Social History Narrative   Not on file   Immunization History  Administered Date(s) Administered   Influenza, High Dose Seasonal PF 03/27/2018   Moderna Sars-Covid-2 Vaccination 08/17/2019, 09/14/2019, 05/08/2020     Objective: Vital Signs: BP (!) 146/77 (BP Location:  Left Arm, Patient Position: Sitting, Cuff Size: Normal)   Pulse 73   Resp 15   Ht '5\' 2"'  (1.575 m)   Wt 121 lb 9.6 oz (55.2 kg)   BMI 22.24 kg/m    Physical Exam Vitals and nursing note reviewed.  Constitutional:      Appearance: She is well-developed.  HENT:     Head: Normocephalic and atraumatic.  Eyes:     Conjunctiva/sclera: Conjunctivae normal.  Cardiovascular:     Rate  and Rhythm: Normal rate and regular rhythm.     Heart sounds: Normal heart sounds.  Pulmonary:     Effort: Pulmonary effort is normal.     Breath sounds: Normal breath sounds.  Abdominal:     General: Bowel sounds are normal.     Palpations: Abdomen is soft.  Musculoskeletal:     Cervical back: Normal range of motion.  Skin:    General: Skin is warm and dry.     Capillary Refill: Capillary refill takes less than 2 seconds.     Comments: Seborrheic keratosis on chest noted.  Neurological:     Mental Status: She is alert and oriented to person, place, and time.  Psychiatric:        Behavior: Behavior normal.      Musculoskeletal Exam: C-spine, thoracic spine, and lumbar spine good ROM.  Shoulder joints have good ROM.  Elbow joints have good ROM with no tenderness or inflammation. Synovial thickening of MCP joints.  PIP and DIP thickening. Flexion contracture of right 4th PIP.  No tenderness or synovitis over MCP joints.  Hip joints have good ROM with no groin pain.  Right knee fullness and mild warmth noted.  Left knee has good ROM with no warmth or effusion.  Ankle joints have good ROM with no tenderness.  Overcrowding of toes.  Thickening of both 1st MTP joints.  PIP and DIP thickening consistent with OA of both feet.   CDAI Exam: CDAI Score: 0.2  Patient Global: 1 mm; Provider Global: 1 mm Swollen: 0 ; Tender: 0  Joint Exam 02/20/2022   No joint exam has been documented for this visit   There is currently no information documented on the homunculus. Go to the Rheumatology activity and complete the homunculus joint exam.  Investigation: No additional findings.  Imaging: No results found.  Recent Labs: Lab Results  Component Value Date   WBC 6.8 01/17/2022   HGB 12.1 01/17/2022   PLT 270 01/17/2022   NA 132 (L) 01/17/2022   K 3.8 01/17/2022   CL 96 (L) 01/17/2022   CO2 26 01/17/2022   GLUCOSE 100 (H) 01/17/2022   BUN 14 01/17/2022   CREATININE 0.73 01/17/2022    BILITOT 0.5 01/17/2022   ALKPHOS 59 05/14/2016   AST 28 01/17/2022   ALT 16 01/17/2022   PROT 6.2 01/17/2022   ALBUMIN 4.5 05/14/2016   CALCIUM 9.4 01/17/2022   GFRAA 83 11/20/2020   QFTBGOLDPLUS NEGATIVE 05/15/2021    Speciality Comments: no baseline biologic labs  Procedures:  No procedures performed Allergies: Patient has no known allergies.    Assessment / Plan:     Visit Diagnoses: Rheumatoid arthritis of multiple sites with negative rheumatoid factor ([place]) -She has no synovitis on examination today.  She has synovial thickening over all MCP joints but no active synovitis or tenderness noted.  She has some fullness and mild warmth in the right knee but no effusion was noted.  Patient declined updated x-rays of the right knee  today.  She had a flare in the right knee in July requiring a prednisone taper starting 20 mg tapering by 5 mg every 4 days.  She has not had any recurrence of symptoms since completing the prednisone taper.  She had previous x-rays of her hands and feet on 11/19/2017.  She declined having updated x-rays today to assess for radiographic progression.  She is currently asymptomatic on current therapy and does not want to make any medication changes.  She remains on Cimzia 400 mg sq injections every 28 days, methotrexate 0.5 mg sq injections once weekly, and folic acid 2 mg daily.  Her Cimzia dose was administered while she was in the office today.   She was advised to notify us if she develops increased joint pain or joint swelling.  She will follow-up in the office in 5 months or sooner if needed. Plan: certolizumab pegol (CIMZIA) kit 400 mg  High risk medication use - In-office Cimzia 400 mg sq injection every 28 days (administered today 02/20/22), methotrexate 0.5 ml sq injections every 7 days, and folic acid 1 mg 2 tablets daily.   - Plan: QuantiFERON-TB Gold Plus CBC and CMP were updated on 01/17/2022.  Her next lab work will be due in November and every 3 months to  monitor for drug toxicity.  Standing orders for CBC and CMP remain in place. TB Gold negative on 05/15/2021.  Future order for TB Gold placed today. She has not had any recent or recurrent infections.  Discussed the importance of holding Cimzia and methotrexate if she develops signs or symptoms of an infection and to resume once the infection has completely cleared.  Screening for tuberculosis -Future order for TB gold was placed today.  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  Flexion contracture noted in the right fourth PIP joint.  Discussed the importance of joint protection and muscle strengthening.  Primary osteoarthritis of both feet: She has PIP and DIP thickening as well as first MTP joint prominence consistent with osteoarthritic changes.  Ankle joints have good range of motion with no tenderness or synovitis.  Trigger finger, right ring finger: Resolved.  Cortisone injection administered on 10/24/2021.  Other medical conditions are listed as follows:  Osteopenia of multiple sites - DEXA is not in Epic.  According to the patient her gynecologist has ordered her bone densities in the past.   Dyslipidemia  History of Clostridium difficile infection    Orders: Orders Placed This Encounter  Procedures   QuantiFERON-TB Gold Plus   Meds ordered this encounter  Medications   Tuberculin-Allergy Syringes 27G X 1/2" 1 ML MISC    Sig: Use 1 syringe once weekly to inject methotrexate.    Dispense:  12 each    Refill:  3   certolizumab pegol (CIMZIA) kit 400 mg      Follow-Up Instructions: Return in about 5 months (around 07/23/2022) for Rheumatoid arthritis.   Ofilia Neas, PA-C  Note - This record has been created using Dragon software.  Chart creation errors have been sought, but may not always  have been located.  Such creation errors do not reflect on  the standard of medical care.

## 2022-02-12 ENCOUNTER — Ambulatory Visit: Payer: MEDICARE | Admitting: Physician Assistant

## 2022-02-14 ENCOUNTER — Ambulatory Visit: Payer: MEDICARE

## 2022-02-20 ENCOUNTER — Ambulatory Visit: Payer: MEDICARE | Attending: Physician Assistant | Admitting: Physician Assistant

## 2022-02-20 ENCOUNTER — Encounter: Payer: Self-pay | Admitting: Physician Assistant

## 2022-02-20 VITALS — BP 146/77 | HR 73 | Resp 15 | Ht 62.0 in | Wt 121.6 lb

## 2022-02-20 DIAGNOSIS — M65341 Trigger finger, right ring finger: Secondary | ICD-10-CM | POA: Diagnosis present

## 2022-02-20 DIAGNOSIS — M19042 Primary osteoarthritis, left hand: Secondary | ICD-10-CM | POA: Insufficient documentation

## 2022-02-20 DIAGNOSIS — M19071 Primary osteoarthritis, right ankle and foot: Secondary | ICD-10-CM | POA: Insufficient documentation

## 2022-02-20 DIAGNOSIS — M0609 Rheumatoid arthritis without rheumatoid factor, multiple sites: Secondary | ICD-10-CM | POA: Diagnosis present

## 2022-02-20 DIAGNOSIS — M8589 Other specified disorders of bone density and structure, multiple sites: Secondary | ICD-10-CM | POA: Diagnosis present

## 2022-02-20 DIAGNOSIS — M19041 Primary osteoarthritis, right hand: Secondary | ICD-10-CM | POA: Insufficient documentation

## 2022-02-20 DIAGNOSIS — E785 Hyperlipidemia, unspecified: Secondary | ICD-10-CM | POA: Diagnosis present

## 2022-02-20 DIAGNOSIS — T372X5A Adverse effect of antimalarials and drugs acting on other blood protozoa, initial encounter: Secondary | ICD-10-CM | POA: Insufficient documentation

## 2022-02-20 DIAGNOSIS — Z8619 Personal history of other infectious and parasitic diseases: Secondary | ICD-10-CM | POA: Insufficient documentation

## 2022-02-20 DIAGNOSIS — M19072 Primary osteoarthritis, left ankle and foot: Secondary | ICD-10-CM | POA: Diagnosis present

## 2022-02-20 DIAGNOSIS — H35389 Toxic maculopathy, unspecified eye: Secondary | ICD-10-CM | POA: Diagnosis not present

## 2022-02-20 DIAGNOSIS — Z111 Encounter for screening for respiratory tuberculosis: Secondary | ICD-10-CM | POA: Diagnosis present

## 2022-02-20 DIAGNOSIS — Z79899 Other long term (current) drug therapy: Secondary | ICD-10-CM | POA: Insufficient documentation

## 2022-02-20 MED ORDER — CERTOLIZUMAB PEGOL 2 X 200 MG ~~LOC~~ KIT
400.0000 mg | PACK | Freq: Once | SUBCUTANEOUS | Status: AC
  Administered 2022-02-20: 400 mg via SUBCUTANEOUS

## 2022-02-20 MED ORDER — TUBERCULIN-ALLERGY SYRINGES 27G X 1/2" 1 ML MISC
3 refills | Status: DC
Start: 2022-02-20 — End: 2022-07-24

## 2022-02-20 NOTE — Patient Instructions (Addendum)
Nutrafol for hair loss    Seborrheic keratosis    Standing Labs We placed an order today for your standing lab work.   Please have your standing labs drawn in November and every 3 months   If possible, please have your labs drawn 2 weeks prior to your appointment so that the provider can discuss your results at your appointment.  Please note that you may see your imaging and lab results in MyChart before we have reviewed them. We may be awaiting multiple results to interpret others before contacting you. Please allow our office up to 72 hours to thoroughly review all of the results before contacting the office for clarification of your results.  We currently have open lab daily: Monday through Thursday from 1:30 PM-4:30 PM and Friday from 1:30 PM- 4:00 PM If possible, please come for your lab work on Monday, Thursday or Friday afternoons, as you may experience shorter wait times.   Effective April 17, 2022 the new lab hours will change to: Monday through Thursday from 1:30 PM-5:00 PM and Friday from 8:30 AM-12:00 PM If possible, please come for your lab work on Monday and Thursday afternoons, as you may experience shorter wait times.  Please be advised, all patients with office appointments requiring lab work will take precedent over walk-in lab work.    The office is located at [address], [place], [place] [address] No appointment is necessary.   Labs are drawn by [facility]. Please bring your co-pay at the time of your lab draw.  You may receive a bill from [facility] for your lab work.  Please note if you are on Hydroxychloroquine and and an order has been placed for a Hydroxychloroquine level, you will need to have it drawn 4 hours or more after your last dose.  If you wish to have your labs drawn at another location, please call the office 24 hours in advance to send orders.  If you have any questions regarding directions or hours of operation,  please call  [phone].   As a reminder, please drink plenty of water prior to coming for your lab work. Thanks!  If you have signs or symptoms of an infection or start antibiotics: First, call your PCP for workup of your infection. Hold your medication through the infection, until you complete your antibiotics, and until symptoms resolve if you take the following: Injectable medication (Actemra, Benlysta, Cimzia, Cosentyx, Enbrel, Humira, Kevzara, Orencia, Remicade, Simponi, Stelara, Taltz, Tremfya) Methotrexate Leflunomide (Arava) Mycophenolate (Cellcept) Harriette Ohara, Olumiant, or Rinvoq  Vaccines You are taking a medication(s) that can suppress your immune system.  The following immunizations are recommended: Flu annually Covid-19  Td/Tdap (tetanus, diphtheria, pertussis) every 10 years Pneumonia (Prevnar 15 then Pneumovax 23 at least 1 year apart.  Alternatively, can take Prevnar 20 without needing additional dose) Shingrix: 2 doses from 4 weeks to 6 months apart  Please check with your PCP to make sure you are up to date.

## 2022-02-20 NOTE — Progress Notes (Signed)
Subjective:   Patient presents to clinic today to receive monthly dose of Cimzia.  Patient running a fever or have signs/symptoms of infection? No  Patient currently on antibiotics for the treatment of infection? No  Patient have any upcoming invasive procedures/surgeries? No  Objective: CMP     Component Value Date/Time   NA 132 (L) 01/17/2022 1426   NA 138 02/20/2016 0000   K 3.8 01/17/2022 1426   CL 96 (L) 01/17/2022 1426   CO2 26 01/17/2022 1426   GLUCOSE 100 (H) 01/17/2022 1426   BUN 14 01/17/2022 1426   BUN 13 02/20/2016 0000   CREATININE 0.73 01/17/2022 1426   CALCIUM 9.4 01/17/2022 1426   PROT 6.2 01/17/2022 1426   ALBUMIN 4.5 05/14/2016 1314   AST 28 01/17/2022 1426   ALT 16 01/17/2022 1426   ALKPHOS 59 05/14/2016 1314   BILITOT 0.5 01/17/2022 1426   GFRNONAA 72 11/20/2020 1401   GFRAA 83 11/20/2020 1401    CBC    Component Value Date/Time   WBC 6.8 01/17/2022 1426   RBC 3.57 (L) 01/17/2022 1426   HGB 12.1 01/17/2022 1426   HCT 34.6 (L) 01/17/2022 1426   PLT 270 01/17/2022 1426   MCV 96.9 01/17/2022 1426   MCH 33.9 (H) 01/17/2022 1426   MCHC 35.0 01/17/2022 1426   RDW 13.2 01/17/2022 1426   LYMPHSABS 1,979 01/17/2022 1426   MONOABS 434 05/14/2016 1314   EOSABS 109 01/17/2022 1426   BASOSABS 48 01/17/2022 1426    Baseline Immunosuppressant Therapy Labs TB GOLD    Latest Ref Rng & Units 05/15/2021    1:45 PM  Quantiferon TB Gold  Quantiferon TB Gold Plus NEGATIVE NEGATIVE    Hepatitis Panel    Latest Ref Rng & Units 03/05/2018    1:53 PM  Hepatitis  Hep B Surface Ag NON-REACTI NON-REACTIVE   Hep B IgM NON-REACTI NON-REACTIVE   Hep C Ab NON-REACTI NON-REACTIVE    HIV Lab Results  Component Value Date   HIV NON-REACTIVE 03/05/2018   Immunoglobulins    Latest Ref Rng & Units 03/05/2018    1:53 PM  Immunoglobulin Electrophoresis  IgA  20 - 320 mg/dL 119   IgG 600 - 1,540 mg/dL 844   IgM 50 - 300 mg/dL 156    SPEP    Latest Ref Rng &  Units 01/17/2022    2:26 PM  Serum Protein Electrophoresis  Total Protein 6.1 - 8.1 g/dL 6.2    G6PD Lab Results  Component Value Date   G6PDH 17.9 11/19/2017   TPMT No results found for: "TPMT"   Chest x-ray: 10/2005  Assessment/Plan:   Administrations This Visit     certolizumab pegol (CIMZIA) kit 400 mg     Admin Date 02/20/2022 Action Given Dose 400 mg Route Subcutaneous Administered By Carole Binning, LPN             Patient tolerated injection well.   Appointment for next injection scheduled for 03/20/2022. Patient due for labs in November 2023.  Patient is to call and reschedule appointment if running a fever with signs/symptoms of infection, on antibiotics for active infection or has an upcoming invasive procedure.  All questions encouraged and answered.  Instructed patient to call with any further questions or concerns.

## 2022-03-20 ENCOUNTER — Ambulatory Visit: Payer: MEDICARE | Attending: Rheumatology | Admitting: *Deleted

## 2022-03-20 VITALS — BP 147/76 | HR 69

## 2022-03-20 DIAGNOSIS — M0609 Rheumatoid arthritis without rheumatoid factor, multiple sites: Secondary | ICD-10-CM | POA: Insufficient documentation

## 2022-03-20 MED ORDER — CERTOLIZUMAB PEGOL 2 X 200 MG ~~LOC~~ KIT
400.0000 mg | PACK | Freq: Once | SUBCUTANEOUS | Status: AC
  Administered 2022-03-20: 400 mg via SUBCUTANEOUS

## 2022-03-20 NOTE — Progress Notes (Signed)
Pharmacy Note  Subjective:   Patient presents to clinic today to receive monthly dose of Cimzia.  Patient running a fever or have signs/symptoms of infection? No  Patient currently on antibiotics for the treatment of infection? No  Patient have any upcoming invasive procedures/surgeries? No  Objective: CMP     Component Value Date/Time   NA 132 (L) 01/17/2022 1426   NA 138 02/20/2016 0000   K 3.8 01/17/2022 1426   CL 96 (L) 01/17/2022 1426   CO2 26 01/17/2022 1426   GLUCOSE 100 (H) 01/17/2022 1426   BUN 14 01/17/2022 1426   BUN 13 02/20/2016 0000   CREATININE 0.73 01/17/2022 1426   CALCIUM 9.4 01/17/2022 1426   PROT 6.2 01/17/2022 1426   ALBUMIN 4.5 05/14/2016 1314   AST 28 01/17/2022 1426   ALT 16 01/17/2022 1426   ALKPHOS 59 05/14/2016 1314   BILITOT 0.5 01/17/2022 1426   GFRNONAA 72 11/20/2020 1401   GFRAA 83 11/20/2020 1401    CBC    Component Value Date/Time   WBC 6.8 01/17/2022 1426   RBC 3.57 (L) 01/17/2022 1426   HGB 12.1 01/17/2022 1426   HCT 34.6 (L) 01/17/2022 1426   PLT 270 01/17/2022 1426   MCV 96.9 01/17/2022 1426   MCH 33.9 (H) 01/17/2022 1426   MCHC 35.0 01/17/2022 1426   RDW 13.2 01/17/2022 1426   LYMPHSABS 1,979 01/17/2022 1426   MONOABS 434 05/14/2016 1314   EOSABS 109 01/17/2022 1426   BASOSABS 48 01/17/2022 1426    Baseline Immunosuppressant Therapy Labs TB GOLD    Latest Ref Rng & Units 05/15/2021    1:45 PM  Quantiferon TB Gold  Quantiferon TB Gold Plus NEGATIVE NEGATIVE    Hepatitis Panel    Latest Ref Rng & Units 03/05/2018    1:53 PM  Hepatitis  Hep B Surface Ag NON-REACTI NON-REACTIVE   Hep B IgM NON-REACTI NON-REACTIVE   Hep C Ab NON-REACTI NON-REACTIVE    HIV Lab Results  Component Value Date   HIV NON-REACTIVE 03/05/2018   Immunoglobulins    Latest Ref Rng & Units 03/05/2018    1:53 PM  Immunoglobulin Electrophoresis  IgA  20 - 320 mg/dL 119   IgG 600 - 1,540 mg/dL 844   IgM 50 - 300 mg/dL 156    SPEP     Latest Ref Rng & Units 01/17/2022    2:26 PM  Serum Protein Electrophoresis  Total Protein 6.1 - 8.1 g/dL 6.2    G6PD Lab Results  Component Value Date   G6PDH 17.9 11/19/2017   TPMT No results found for: "TPMT"   Chest x-ray:  10/2005  Assessment/Plan:   Administrations This Visit     certolizumab pegol (CIMZIA) kit 400 mg     Admin Date 03/20/2022 Action Given Dose 400 mg Route Subcutaneous Administered By Carole Binning, LPN             Patient tolerated injection well.   Appointment for next injection scheduled for 04/17/2022.  Patient due for labs in November 2023.  Patient is to call and reschedule appointment if running a fever with signs/symptoms of infection, on antibiotics for active infection or has an upcoming invasive procedure.  All questions encouraged and answered.  Instructed patient to call with any further questions or concerns.

## 2022-04-02 ENCOUNTER — Other Ambulatory Visit: Payer: Self-pay | Admitting: Physician Assistant

## 2022-04-02 NOTE — Telephone Encounter (Signed)
Next Visit: 07/24/2022  Last Visit: 02/20/2022  Last Fill: 12/28/2021   DX: Rheumatoid arthritis of multiple sites with negative rheumatoid factor   Current Dose per office note 02/20/2022: methotrexate 0.5 ml sq injections every 7 days   Labs: 01/17/2022 Sodium and chloride are low and stable.  CBC is normal.    Okay to refill MTX?

## 2022-04-06 ENCOUNTER — Other Ambulatory Visit: Payer: Self-pay | Admitting: Physician Assistant

## 2022-04-08 NOTE — Telephone Encounter (Signed)
Next Visit: 07/24/2022   Last Visit: 02/20/2022   DX: Rheumatoid arthritis of multiple sites with negative rheumatoid factor    Current Dose per office note 02/20/2022: methotrexate 0.5 ml sq injections every 7 days   Labs: 01/17/2022 Sodium and chloride are low and stable.  CBC is normal.    Spoke with patient and she is agreeable to change to tablets until the vial is no longer on backorder.    Okay to change MTX to tablets?

## 2022-04-17 ENCOUNTER — Ambulatory Visit: Payer: MEDICARE | Attending: Rheumatology | Admitting: *Deleted

## 2022-04-17 VITALS — BP 134/85 | HR 81

## 2022-04-17 DIAGNOSIS — M0609 Rheumatoid arthritis without rheumatoid factor, multiple sites: Secondary | ICD-10-CM | POA: Diagnosis present

## 2022-04-17 DIAGNOSIS — Z79899 Other long term (current) drug therapy: Secondary | ICD-10-CM | POA: Insufficient documentation

## 2022-04-17 DIAGNOSIS — Z111 Encounter for screening for respiratory tuberculosis: Secondary | ICD-10-CM | POA: Insufficient documentation

## 2022-04-17 MED ORDER — CERTOLIZUMAB PEGOL 2 X 200 MG ~~LOC~~ KIT
400.0000 mg | PACK | Freq: Once | SUBCUTANEOUS | Status: AC
  Administered 2022-04-17: 400 mg via SUBCUTANEOUS

## 2022-04-17 NOTE — Progress Notes (Signed)
Subjective:   Patient presents to clinic today to receive monthly dose of Cimzia.  Patient running a fever or have signs/symptoms of infection? No  Patient currently on antibiotics for the treatment of infection? No  Patient have any upcoming invasive procedures/surgeries? No  Objective: CMP     Component Value Date/Time   NA 132 (L) 01/17/2022 1426   NA 138 02/20/2016 0000   K 3.8 01/17/2022 1426   CL 96 (L) 01/17/2022 1426   CO2 26 01/17/2022 1426   GLUCOSE 100 (H) 01/17/2022 1426   BUN 14 01/17/2022 1426   BUN 13 02/20/2016 0000   CREATININE 0.73 01/17/2022 1426   CALCIUM 9.4 01/17/2022 1426   PROT 6.2 01/17/2022 1426   ALBUMIN 4.5 05/14/2016 1314   AST 28 01/17/2022 1426   ALT 16 01/17/2022 1426   ALKPHOS 59 05/14/2016 1314   BILITOT 0.5 01/17/2022 1426   GFRNONAA 72 11/20/2020 1401   GFRAA 83 11/20/2020 1401    CBC    Component Value Date/Time   WBC 6.8 01/17/2022 1426   RBC 3.57 (L) 01/17/2022 1426   HGB 12.1 01/17/2022 1426   HCT 34.6 (L) 01/17/2022 1426   PLT 270 01/17/2022 1426   MCV 96.9 01/17/2022 1426   MCH 33.9 (H) 01/17/2022 1426   MCHC 35.0 01/17/2022 1426   RDW 13.2 01/17/2022 1426   LYMPHSABS 1,979 01/17/2022 1426   MONOABS 434 05/14/2016 1314   EOSABS 109 01/17/2022 1426   BASOSABS 48 01/17/2022 1426    Baseline Immunosuppressant Therapy Labs TB GOLD    Latest Ref Rng & Units 05/15/2021    1:45 PM  Quantiferon TB Gold  Quantiferon TB Gold Plus NEGATIVE NEGATIVE    Hepatitis Panel    Latest Ref Rng & Units 03/05/2018    1:53 PM  Hepatitis  Hep B Surface Ag NON-REACTI NON-REACTIVE   Hep B IgM NON-REACTI NON-REACTIVE   Hep C Ab NON-REACTI NON-REACTIVE    HIV Lab Results  Component Value Date   HIV NON-REACTIVE 03/05/2018   Immunoglobulins    Latest Ref Rng & Units 03/05/2018    1:53 PM  Immunoglobulin Electrophoresis  IgA  20 - 320 mg/dL 119   IgG 600 - 1,540 mg/dL 844   IgM 50 - 300 mg/dL 156    SPEP    Latest Ref Rng &  Units 01/17/2022    2:26 PM  Serum Protein Electrophoresis  Total Protein 6.1 - 8.1 g/dL 6.2    G6PD Lab Results  Component Value Date   G6PDH 17.9 11/19/2017   TPMT No results found for: "TPMT"   Chest x-ray:   10/2005  Assessment/Plan:   Administrations This Visit     certolizumab pegol (CIMZIA) kit 400 mg     Admin Date 04/17/2022 Action Given Dose 400 mg Route Subcutaneous Administered By Carole Binning, LPN             Patient tolerated injection well.   Appointment for next injection scheduled for 05/15/2022.  Patient due for labs today and had them drawn in office.  Patient is to call and reschedule appointment if running a fever with signs/symptoms of infection, on antibiotics for active infection or has an upcoming invasive procedure.  All questions encouraged and answered.  Instructed patient to call with any further questions or concerns.

## 2022-04-18 NOTE — Progress Notes (Signed)
CBC and CMP WNL

## 2022-04-21 LAB — COMPLETE METABOLIC PANEL WITH GFR
AG Ratio: 1.7 (calc) (ref 1.0–2.5)
ALT: 18 U/L (ref 6–29)
AST: 25 U/L (ref 10–35)
Albumin: 4.3 g/dL (ref 3.6–5.1)
Alkaline phosphatase (APISO): 101 U/L (ref 37–153)
BUN: 16 mg/dL (ref 7–25)
CO2: 27 mmol/L (ref 20–32)
Calcium: 9.4 mg/dL (ref 8.6–10.4)
Chloride: 99 mmol/L (ref 98–110)
Creat: 0.75 mg/dL (ref 0.60–0.95)
Globulin: 2.5 g/dL (calc) (ref 1.9–3.7)
Glucose, Bld: 85 mg/dL (ref 65–99)
Potassium: 3.7 mmol/L (ref 3.5–5.3)
Sodium: 135 mmol/L (ref 135–146)
Total Bilirubin: 0.7 mg/dL (ref 0.2–1.2)
Total Protein: 6.8 g/dL (ref 6.1–8.1)
eGFR: 80 mL/min/{1.73_m2} (ref >=60)

## 2022-04-21 LAB — CBC WITH DIFFERENTIAL/PLATELET
Absolute Monocytes: 439 cells/uL (ref 200–950)
Basophils Absolute: 79 cells/uL (ref 0–200)
Basophils Relative: 1.3 %
Eosinophils Absolute: 31 cells/uL (ref 15–500)
Eosinophils Relative: 0.5 %
HCT: 35.4 % (ref 35.0–45.0)
Hemoglobin: 12.3 g/dL (ref 11.7–15.5)
Lymphs Abs: 1867 cells/uL (ref 850–3900)
MCH: 32.3 pg (ref 27.0–33.0)
MCHC: 34.7 g/dL (ref 32.0–36.0)
MCV: 92.9 fL (ref 80.0–100.0)
MPV: 8.8 fL (ref 7.5–12.5)
Monocytes Relative: 7.2 %
Neutro Abs: 3684 cells/uL (ref 1500–7800)
Neutrophils Relative %: 60.4 %
Platelets: 361 10*3/uL (ref 140–400)
RBC: 3.81 10*6/uL (ref 3.80–5.10)
RDW: 12.1 % (ref 11.0–15.0)
Total Lymphocyte: 30.6 %
WBC: 6.1 10*3/uL (ref 3.8–10.8)

## 2022-04-21 LAB — QUANTIFERON-TB GOLD PLUS
Mitogen-NIL: 5.67 IU/mL
NIL: 0.05 IU/mL
QuantiFERON-TB Gold Plus: NEGATIVE
TB1-NIL: <0 IU/mL
TB2-NIL: <0 IU/mL

## 2022-04-22 NOTE — Progress Notes (Signed)
TB gold negative

## 2022-05-15 ENCOUNTER — Ambulatory Visit: Payer: MEDICARE | Attending: Rheumatology | Admitting: *Deleted

## 2022-05-15 VITALS — BP 155/85 | HR 82

## 2022-05-15 DIAGNOSIS — M0609 Rheumatoid arthritis without rheumatoid factor, multiple sites: Secondary | ICD-10-CM | POA: Insufficient documentation

## 2022-05-15 MED ORDER — CERTOLIZUMAB PEGOL 2 X 200 MG ~~LOC~~ KIT
400.0000 mg | PACK | Freq: Once | SUBCUTANEOUS | Status: AC
  Administered 2022-05-15: 400 mg via SUBCUTANEOUS

## 2022-05-15 NOTE — Progress Notes (Signed)
Subjective:   Patient presents to clinic today to receive monthly dose of Cimzia.  Patient running a fever or have signs/symptoms of infection? No  Patient currently on antibiotics for the treatment of infection? No  Patient have any upcoming invasive procedures/surgeries? No  Objective: CMP     Component Value Date/Time   NA 135 04/17/2022 1458   NA 138 02/20/2016 0000   K 3.7 04/17/2022 1458   CL 99 04/17/2022 1458   CO2 27 04/17/2022 1458   GLUCOSE 85 04/17/2022 1458   BUN 16 04/17/2022 1458   BUN 13 02/20/2016 0000   CREATININE 0.75 04/17/2022 1458   CALCIUM 9.4 04/17/2022 1458   PROT 6.8 04/17/2022 1458   ALBUMIN 4.5 05/14/2016 1314   AST 25 04/17/2022 1458   ALT 18 04/17/2022 1458   ALKPHOS 59 05/14/2016 1314   BILITOT 0.7 04/17/2022 1458   GFRNONAA 72 11/20/2020 1401   GFRAA 83 11/20/2020 1401    CBC    Component Value Date/Time   WBC 6.1 04/17/2022 1458   RBC 3.81 04/17/2022 1458   HGB 12.3 04/17/2022 1458   HCT 35.4 04/17/2022 1458   PLT 361 04/17/2022 1458   MCV 92.9 04/17/2022 1458   MCH 32.3 04/17/2022 1458   MCHC 34.7 04/17/2022 1458   RDW 12.1 04/17/2022 1458   LYMPHSABS 1,867 04/17/2022 1458   MONOABS 434 05/14/2016 1314   EOSABS 31 04/17/2022 1458   BASOSABS 79 04/17/2022 1458    Baseline Immunosuppressant Therapy Labs TB GOLD    Latest Ref Rng & Units 04/17/2022    2:58 PM  Quantiferon TB Gold  Quantiferon TB Gold Plus NEGATIVE NEGATIVE    Hepatitis Panel    Latest Ref Rng & Units 03/05/2018    1:53 PM  Hepatitis  Hep B Surface Ag NON-REACTI NON-REACTIVE   Hep B IgM NON-REACTI NON-REACTIVE   Hep C Ab NON-REACTI NON-REACTIVE    HIV Lab Results  Component Value Date   HIV NON-REACTIVE 03/05/2018   Immunoglobulins    Latest Ref Rng & Units 03/05/2018    1:53 PM  Immunoglobulin Electrophoresis  IgA  20 - 320 mg/dL 119   IgG 600 - 1,540 mg/dL 844   IgM 50 - 300 mg/dL 156    SPEP    Latest Ref Rng & Units 04/17/2022    2:58  PM  Serum Protein Electrophoresis  Total Protein 6.1 - 8.1 g/dL 6.8    G6PD Lab Results  Component Value Date   G6PDH 17.9 11/19/2017   TPMT No results found for: "TPMT"   Chest x-ray: 10/2005  Assessment/Plan:   Administrations This Visit     certolizumab pegol (CIMZIA) kit 400 mg     Admin Date 05/15/2022 Action Given Dose 400 mg Route Subcutaneous Administered By Carole Binning, LPN             Patient tolerated injection well.   Appointment for next injection scheduled for 06/13/2022.  Patient due for labs in February 2024.  Patient is to call and reschedule appointment if running a fever with signs/symptoms of infection, on antibiotics for active infection or has an upcoming invasive procedure.  All questions encouraged and answered.  Instructed patient to call with any further questions or concerns.

## 2022-05-17 ENCOUNTER — Telehealth: Payer: Self-pay | Admitting: Pharmacist

## 2022-05-17 NOTE — Telephone Encounter (Signed)
Submitted reverification of benefits request in [facility] portal. Patient confirmed that she will not be having any changes to insurance in 2024  April Shepard, PharmD, MPH, BCPS, CPP Clinical Pharmacist (Rheumatology and Pulmonology)

## 2022-06-13 ENCOUNTER — Ambulatory Visit: Payer: MEDICARE | Attending: Rheumatology | Admitting: *Deleted

## 2022-06-13 VITALS — BP 122/78 | HR 84

## 2022-06-13 DIAGNOSIS — M0609 Rheumatoid arthritis without rheumatoid factor, multiple sites: Secondary | ICD-10-CM | POA: Diagnosis present

## 2022-06-13 MED ORDER — CERTOLIZUMAB PEGOL 2 X 200 MG ~~LOC~~ KIT
400.0000 mg | PACK | Freq: Once | SUBCUTANEOUS | Status: AC
  Administered 2022-06-13: 400 mg via SUBCUTANEOUS

## 2022-06-13 NOTE — Progress Notes (Signed)
Subjective:   Patient presents to clinic today to receive monthly dose of Cimzia.  Patient running a fever or have signs/symptoms of infection? No  Patient currently on antibiotics for the treatment of infection? No  Patient have any upcoming invasive procedures/surgeries? No  Objective: CMP     Component Value Date/Time   NA 135 04/17/2022 1458   NA 138 02/20/2016 0000   K 3.7 04/17/2022 1458   CL 99 04/17/2022 1458   CO2 27 04/17/2022 1458   GLUCOSE 85 04/17/2022 1458   BUN 16 04/17/2022 1458   BUN 13 02/20/2016 0000   CREATININE 0.75 04/17/2022 1458   CALCIUM 9.4 04/17/2022 1458   PROT 6.8 04/17/2022 1458   ALBUMIN 4.5 05/14/2016 1314   AST 25 04/17/2022 1458   ALT 18 04/17/2022 1458   ALKPHOS 59 05/14/2016 1314   BILITOT 0.7 04/17/2022 1458   GFRNONAA 72 11/20/2020 1401   GFRAA 83 11/20/2020 1401    CBC    Component Value Date/Time   WBC 6.1 04/17/2022 1458   RBC 3.81 04/17/2022 1458   HGB 12.3 04/17/2022 1458   HCT 35.4 04/17/2022 1458   PLT 361 04/17/2022 1458   MCV 92.9 04/17/2022 1458   MCH 32.3 04/17/2022 1458   MCHC 34.7 04/17/2022 1458   RDW 12.1 04/17/2022 1458   LYMPHSABS 1,867 04/17/2022 1458   MONOABS 434 05/14/2016 1314   EOSABS 31 04/17/2022 1458   BASOSABS 79 04/17/2022 1458    Baseline Immunosuppressant Therapy Labs TB GOLD    Latest Ref Rng & Units 04/17/2022    2:58 PM  Quantiferon TB Gold  Quantiferon TB Gold Plus NEGATIVE NEGATIVE    Hepatitis Panel    Latest Ref Rng & Units 03/05/2018    1:53 PM  Hepatitis  Hep B Surface Ag NON-REACTI NON-REACTIVE   Hep B IgM NON-REACTI NON-REACTIVE   Hep C Ab NON-REACTI NON-REACTIVE    HIV Lab Results  Component Value Date   HIV NON-REACTIVE 03/05/2018   Immunoglobulins    Latest Ref Rng & Units 03/05/2018    1:53 PM  Immunoglobulin Electrophoresis  IgA  20 - 320 mg/dL 119   IgG 600 - 1,540 mg/dL 844   IgM 50 - 300 mg/dL 156    SPEP    Latest Ref Rng & Units 04/17/2022    2:58  PM  Serum Protein Electrophoresis  Total Protein 6.1 - 8.1 g/dL 6.8    G6PD Lab Results  Component Value Date   G6PDH 17.9 11/19/2017   TPMT No results found for: "TPMT"   Chest x-ray:  10/2005   Assessment/Plan:   Administrations This Visit     certolizumab pegol (CIMZIA) kit 400 mg     Admin Date 06/13/2022 Action Given Dose 400 mg Route Subcutaneous Administered By ,  L, LPN             Patient tolerated injection well.   Appointment for next injection scheduled for 07/11/2022.  Patient due for labs in February 2024.  Patient is to call and reschedule appointment if running a fever with signs/symptoms of infection, on antibiotics for active infection or has an upcoming invasive procedure.  All questions encouraged and answered.  Instructed patient to call with any further questions or concerns.   

## 2022-07-05 NOTE — Telephone Encounter (Signed)
Message sent to Case manager on [facility] portal for update as patient's next Cimzia dose is scheduled for 07/11/2022.  Knox Saliva, PharmD, MPH, BCPS, CPP Clinical Pharmacist (Rheumatology and Pulmonology)

## 2022-07-08 NOTE — Telephone Encounter (Signed)
Received fax from [facility] for Cimzia in-office injection verification of benefits. Patient has  Medicare + [employer] supplement plan. Both are active plans. [employer] supplemental plan follows Medicare guidelines.  [facility] is in network. Patient has $240 Medicare A/B deductible. Once deductible is met, patient's [place] G plan will pick up 20% coinsurance that is associated with Cimzia in-office injection. 80% is covered by Medicare A/B. Pre-certification is not required for Cimzia J0717. CPT code [phone] is valid and billable with 20% coinsurance.  Pdf sent to media tab of patient's chart.  Knox Saliva, PharmD, MPH, BCPS, CPP Clinical Pharmacist (Rheumatology and Pulmonology)

## 2022-07-11 ENCOUNTER — Ambulatory Visit: Payer: MEDICARE | Attending: Rheumatology | Admitting: *Deleted

## 2022-07-11 VITALS — BP 176/83 | HR 74 | Resp 16

## 2022-07-11 DIAGNOSIS — M0609 Rheumatoid arthritis without rheumatoid factor, multiple sites: Secondary | ICD-10-CM

## 2022-07-11 MED ORDER — CERTOLIZUMAB PEGOL 2 X 200 MG ~~LOC~~ KIT
400.0000 mg | PACK | Freq: Once | SUBCUTANEOUS | Status: AC
  Administered 2022-07-11: 400 mg via SUBCUTANEOUS

## 2022-07-11 NOTE — Progress Notes (Signed)
Office Visit Note  Patient: April Shepard             Date of Birth: [birth year]           MRN: [id number]             PCP: Colbert Coyer, MD Referring: Colbert Coyer, MD Visit Date: 07/24/2022 Occupation: @GUAROCC @  Subjective:  Medication management  History of Present Illness: April Shepard is a 82 y.o. female with history of seropositive rheumatoid arthritis and osteoarthritis.  She denies any history of rheumatoid arthritis flare since her last visit.  She denies any increased joint pain or joint swelling.  She has been on Cimzia 400 mg subcutaneous in office injections every 28 days.  She is also on methotrexate 5 tablets p.o. weekly along with folic acid 1 mg p.o. daily.  She switched from subcu methotrexate to oral methotrexate due to shortage of subcutaneous methotrexate..  She has not had a flare on oral medication.  She is tolerating it well.    Activities of Daily Living:  Patient reports morning stiffness for 0 minutes.   Patient Denies nocturnal pain.  Difficulty dressing/grooming: Denies Difficulty climbing stairs: Denies Difficulty getting out of chair: Denies Difficulty using hands for taps, buttons, cutlery, and/or writing: Denies  Review of Systems  Constitutional:  Positive for fatigue.  HENT: Negative.  Negative for mouth sores and mouth dryness.   Eyes: Negative.  Negative for dryness.  Respiratory: Negative.  Negative for shortness of breath.   Cardiovascular: Negative.  Negative for chest pain and palpitations.  Gastrointestinal: Negative.  Negative for blood in stool, constipation and diarrhea.  Endocrine: Negative.  Negative for increased urination.  Genitourinary: Negative.  Negative for involuntary urination.  Musculoskeletal:  Positive for joint pain, gait problem, joint pain and joint swelling. Negative for myalgias, muscle weakness, morning stiffness, muscle tenderness and myalgias.  Skin: Negative.  Negative for color change,  rash, hair loss and sensitivity to sunlight.  Allergic/Immunologic: Negative.  Negative for susceptible to infections.  Neurological:  Negative for dizziness and headaches.  Hematological: Negative.  Negative for swollen glands.  Psychiatric/Behavioral: Negative.  Negative for depressed mood and sleep disturbance. The patient is not nervous/anxious.     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   High risk medication use 10/22/2016   Trigger finger, right ring finger 10/22/2016   Osteopenia 05/13/2016   Osteoarthritis of both hands 05/13/2016   RA (rheumatoid arthritis) (HCC) 05/13/2016    Past Medical History:  Diagnosis Date   Osteoarthritis of both hands 05/13/2016   Osteopenia 05/13/2016   RA (rheumatoid arthritis) (HCC) 05/13/2016    Family History  Problem Relation Age of Onset   Breast cancer Mother    Heart attack Father    Melanoma Brother    Past Surgical History:  Procedure Laterality Date   CHOLECYSTECTOMY     Social History   Social History Narrative   Not on file   Immunization History  Administered Date(s) Administered   Influenza, High Dose Seasonal PF 03/27/2018   Moderna Sars-Covid-2 Vaccination 08/17/2019, 09/14/2019, 05/08/2020     Objective: Vital Signs: BP (!) 150/71 (BP Location: Left Arm, Patient Position: Sitting, Cuff Size: Normal)   Pulse 71   Ht 5\' 2"  (1.575 m)   Wt 124 lb (56.2 kg)   BMI 22.68 kg/m    Physical Exam Vitals  and nursing note reviewed.  Constitutional:      Appearance: She is well-developed.  HENT:     Head: Normocephalic and atraumatic.  Eyes:     Conjunctiva/sclera: Conjunctivae normal.  Cardiovascular:     Rate and Rhythm: Normal rate and regular rhythm.     Heart sounds: Normal heart sounds.  Pulmonary:     Effort: Pulmonary effort is normal.     Breath sounds: Normal breath sounds.   Abdominal:     General: Bowel sounds are normal.     Palpations: Abdomen is soft.  Musculoskeletal:     Cervical back: Normal range of motion.  Lymphadenopathy:     Cervical: No cervical adenopathy.  Skin:    General: Skin is warm and dry.     Capillary Refill: Capillary refill takes less than 2 seconds.  Neurological:     Mental Status: She is alert and oriented to person, place, and time.  Psychiatric:        Behavior: Behavior normal.      Musculoskeletal Exam: Cervical spine was in good range of motion.  Shoulder joints, elbow joints, wrist joints were in good range of motion.  She had contracture of her right fourth PIP joint.  MCP thickening with no synovitis was noted.  PIP and DIP thickening was noted.  Hip joints and knee joints in good range of motion.  She had no tenderness over ankles or MTPs.  CDAI Exam: CDAI Score: -- Patient Global: 1 mm; Provider Global: 1 mm Swollen: --; Tender: -- Joint Exam 07/24/2022   No joint exam has been documented for this visit   There is currently no information documented on the homunculus. Go to the Rheumatology activity and complete the homunculus joint exam.  Investigation: No additional findings.  Imaging: No results found.  Recent Labs: Lab Results  Component Value Date   WBC 6.1 04/17/2022   HGB 12.3 04/17/2022   PLT 361 04/17/2022   NA 135 04/17/2022   K 3.7 04/17/2022   CL 99 04/17/2022   CO2 27 04/17/2022   GLUCOSE 85 04/17/2022   BUN 16 04/17/2022   CREATININE 0.75 04/17/2022   BILITOT 0.7 04/17/2022   ALKPHOS 59 05/14/2016   AST 25 04/17/2022   ALT 18 04/17/2022   PROT 6.8 04/17/2022   ALBUMIN 4.5 05/14/2016   CALCIUM 9.4 04/17/2022   GFRAA 83 11/20/2020   QFTBGOLDPLUS NEGATIVE 04/17/2022    Speciality Comments: no baseline biologic labs  Procedures:  No procedures performed Allergies: Patient has no known allergies.   Assessment / Plan:     Visit Diagnoses: Rheumatoid arthritis of multiple  sites with negative rheumatoid factor (HCC)-she had no synovitis on my examination today.  She had bilateral MCP PIP and DIP thickening.  She has not had any flares since the last visit.  She has been getting Cimzia in office injections.  She is also on methotrexate 5 tablets p.o. weekly along with folic acid 1 mg p.o. daily which she has been tolerating well.  She has not noticed flare of rheumatoid arthritis on oral methotrexate.  She she switched from subcu to oral methotrexate due to shortage of subcutaneous injections.  High risk medication use - In-office Cimzia 400 mg sq injection every 28 days, methotrexate 2.5 mg, 5 tablets every 7 days, and folic acid 1 mg, 1 tablet p.o. daily. -April 17, 2022 CBC and CMP were normal.  TB Gold was negative on April 17, 2022.  Plan: CBC with Differential/Platelet, COMPLETE METABOLIC  PANEL WITH GFR today.  TB gold will be checked annually.  Information on immunization was placed in the AVS.  She was advised to hold Cimzia and methotrexate if she develops an infection and resume after the infection resolves.  Use of sunscreen was advised.  Annual skin examination to screen for skin cancer was advised.  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  Joint protection muscle strengthening was advised.  Primary osteoarthritis of both feet-she had no tender nodes.  Proper fitting shoes were advised.  Trigger finger, right ring finger - Resolved.  Cortisone injection administered on 10/24/2021.  She has contracture in her right fifth PIP joint.  No synovitis was noted.  Osteopenia of multiple sites - DEXA is not in Epic.  According to the patient her gynecologist has ordered her bone densities in the past.  Patient reports it has been many years since she had a DEXA scan.  She would schedule DEXA scan [place].  Dyslipidemia  History of Clostridium difficile infection-she denies  diarrhea or recent infection.  Orders: Orders Placed This Encounter  Procedures   CBC with Differential/Platelet   COMPLETE METABOLIC PANEL WITH GFR   No orders of the defined types were placed in this encounter.    Follow-Up Instructions: Return in about 5 months (around 12/22/2022) for Rheumatoid arthritis, Osteoarthritis.   Bo Merino, MD  Note - This record has been created using Editor, commissioning.  Chart creation errors have been sought, but may not always  have been located. Such creation errors do not reflect on  the standard of medical care.

## 2022-07-11 NOTE — Progress Notes (Signed)
Pharmacy Note  Subjective:   Patient presents to clinic today to receive monthly dose of Cimzia.  Patient running a fever or have signs/symptoms of infection? No  Patient currently on antibiotics for the treatment of infection? No  Patient have any upcoming invasive procedures/surgeries? No  Objective: CMP     Component Value Date/Time   NA 135 04/17/2022 1458   NA 138 02/20/2016 0000   K 3.7 04/17/2022 1458   CL 99 04/17/2022 1458   CO2 27 04/17/2022 1458   GLUCOSE 85 04/17/2022 1458   BUN 16 04/17/2022 1458   BUN 13 02/20/2016 0000   CREATININE 0.75 04/17/2022 1458   CALCIUM 9.4 04/17/2022 1458   PROT 6.8 04/17/2022 1458   ALBUMIN 4.5 05/14/2016 1314   AST 25 04/17/2022 1458   ALT 18 04/17/2022 1458   ALKPHOS 59 05/14/2016 1314   BILITOT 0.7 04/17/2022 1458   GFRNONAA 72 11/20/2020 1401   GFRAA 83 11/20/2020 1401    CBC    Component Value Date/Time   WBC 6.1 04/17/2022 1458   RBC 3.81 04/17/2022 1458   HGB 12.3 04/17/2022 1458   HCT 35.4 04/17/2022 1458   PLT 361 04/17/2022 1458   MCV 92.9 04/17/2022 1458   MCH 32.3 04/17/2022 1458   MCHC 34.7 04/17/2022 1458   RDW 12.1 04/17/2022 1458   LYMPHSABS 1,867 04/17/2022 1458   MONOABS 434 05/14/2016 1314   EOSABS 31 04/17/2022 1458   BASOSABS 79 04/17/2022 1458    Baseline Immunosuppressant Therapy Labs TB GOLD    Latest Ref Rng & Units 04/17/2022    2:58 PM  Quantiferon TB Gold  Quantiferon TB Gold Plus NEGATIVE NEGATIVE    Hepatitis Panel    Latest Ref Rng & Units 03/05/2018    1:53 PM  Hepatitis  Hep B Surface Ag NON-REACTI NON-REACTIVE   Hep B IgM NON-REACTI NON-REACTIVE   Hep C Ab NON-REACTI NON-REACTIVE    HIV Lab Results  Component Value Date   HIV NON-REACTIVE 03/05/2018   Immunoglobulins    Latest Ref Rng & Units 03/05/2018    1:53 PM  Immunoglobulin Electrophoresis  IgA  20 - 320 mg/dL 119   IgG 600 - 1,540 mg/dL 844   IgM 50 - 300 mg/dL 156    SPEP    Latest Ref Rng & Units  04/17/2022    2:58 PM  Serum Protein Electrophoresis  Total Protein 6.1 - 8.1 g/dL 6.8    G6PD Lab Results  Component Value Date   G6PDH 17.9 11/19/2017   TPMT No results found for: "TPMT"   Chest x-ray: 10/2005    Assessment/Plan:   Administrations This Visit     certolizumab pegol (CIMZIA) kit 400 mg     Admin Date 07/11/2022 Action Given Dose 400 mg Route Subcutaneous Administered By Carole Binning, LPN             Patient tolerated injection well.   Appointment for next injection scheduled for 08/08/2022.  Patient due for labs in April 2024.  Patient is to call and reschedule appointment if// running a fever with signs/symptoms of infection, on antibiotics for active infection or has an upcoming invasive procedure.  All questions encouraged and answered.  Instructed patient to call with any further questions or concerns.

## 2022-07-17 ENCOUNTER — Other Ambulatory Visit: Payer: Self-pay | Admitting: Physician Assistant

## 2022-07-17 NOTE — Telephone Encounter (Signed)
Next Visit: 07/24/2022  Last Visit: 02/20/2022  Last Fill: 04/08/2022  DX: Rheumatoid arthritis of multiple sites with negative rheumatoid factor   Current Dose per office note on 02/20/2022: methotrexate 0.5 ml sq injections every 7 days  Per telephone encounter on 04/06/2022: Spoke with patient and she is agreeable to change to tablets until the vial is no longer on backorder.    Labs: 06/26/2022 sodium 131, chloride 93, alkaline phosphatase 133, creatine 0.6, MPV 8.7  Okay to refill MTX?

## 2022-07-24 ENCOUNTER — Encounter: Payer: Self-pay | Admitting: Rheumatology

## 2022-07-24 ENCOUNTER — Ambulatory Visit: Payer: MEDICARE | Attending: Rheumatology | Admitting: Rheumatology

## 2022-07-24 VITALS — BP 157/88 | HR 74 | Ht 62.0 in | Wt 124.0 lb

## 2022-07-24 DIAGNOSIS — M65341 Trigger finger, right ring finger: Secondary | ICD-10-CM | POA: Diagnosis present

## 2022-07-24 DIAGNOSIS — M19071 Primary osteoarthritis, right ankle and foot: Secondary | ICD-10-CM | POA: Insufficient documentation

## 2022-07-24 DIAGNOSIS — M19041 Primary osteoarthritis, right hand: Secondary | ICD-10-CM | POA: Diagnosis not present

## 2022-07-24 DIAGNOSIS — H35389 Toxic maculopathy, unspecified eye: Secondary | ICD-10-CM | POA: Diagnosis not present

## 2022-07-24 DIAGNOSIS — E785 Hyperlipidemia, unspecified: Secondary | ICD-10-CM | POA: Diagnosis present

## 2022-07-24 DIAGNOSIS — T372X5A Adverse effect of antimalarials and drugs acting on other blood protozoa, initial encounter: Secondary | ICD-10-CM | POA: Insufficient documentation

## 2022-07-24 DIAGNOSIS — Z8619 Personal history of other infectious and parasitic diseases: Secondary | ICD-10-CM | POA: Insufficient documentation

## 2022-07-24 DIAGNOSIS — M19072 Primary osteoarthritis, left ankle and foot: Secondary | ICD-10-CM | POA: Diagnosis present

## 2022-07-24 DIAGNOSIS — M0609 Rheumatoid arthritis without rheumatoid factor, multiple sites: Secondary | ICD-10-CM | POA: Diagnosis not present

## 2022-07-24 DIAGNOSIS — M19042 Primary osteoarthritis, left hand: Secondary | ICD-10-CM

## 2022-07-24 DIAGNOSIS — M8589 Other specified disorders of bone density and structure, multiple sites: Secondary | ICD-10-CM | POA: Diagnosis present

## 2022-07-24 DIAGNOSIS — Z79899 Other long term (current) drug therapy: Secondary | ICD-10-CM | POA: Insufficient documentation

## 2022-07-24 NOTE — Patient Instructions (Signed)
Standing Labs We placed an order today for your standing lab work.   Please have your standing labs drawn in April and every 3 months  Please have your labs drawn 2 weeks prior to your appointment so that the provider can discuss your lab results at your appointment.  Please note that you may see your imaging and lab results in [place] before we have reviewed them. We will contact you once all results are reviewed. Please allow our office up to 72 hours to thoroughly review all of the results before contacting the office for clarification of your results.  Lab hours are:   Monday through Thursday from 8:00 am -12:30 pm and 1:00 pm-5:00 pm and Friday from 8:00 am-12:00 pm.  Please be advised, all patients with office appointments requiring lab work will take precedent over walk-in lab work.   Labs are drawn by [facility]. Please bring your co-pay at the time of your lab draw.  You may receive a bill from [facility] for your lab work.  Please note if you are on Hydroxychloroquine and and an order has been placed for a Hydroxychloroquine level, you will need to have it drawn 4 hours or more after your last dose.  If you wish to have your labs drawn at another location, please call the office 24 hours in advance so we can fax the orders.  The office is located at [address] [place], [place], [place] [address] No appointment is necessary.    If you have any questions regarding directions or hours of operation,  please call [phone].   As a reminder, please drink plenty of water prior to coming for your lab work. Thanks!   Vaccines You are taking a medication(s) that can suppress your immune system.  The following immunizations are recommended: Flu annually Covid-19  RSV Td/Tdap (tetanus, diphtheria, pertussis) every 10 years Pneumonia (Prevnar 15 then Pneumovax 23 at least 1 year apart.  Alternatively, can take Prevnar 20 without needing additional dose) Shingrix: 2 doses from 4  weeks to 6 months apart  Please check with your PCP to make sure you are up to date.   If you have signs or symptoms of an infection or start antibiotics: First, call your PCP for workup of your infection. Hold your medication through the infection, until you complete your antibiotics, and until symptoms resolve if you take the following: Injectable medication (Actemra, Benlysta, Cimzia, Cosentyx, Enbrel, Humira, Kevzara, Orencia, Remicade, Simponi, Stelara, Taltz, Tremfya) Methotrexate Leflunomide (Arava) Mycophenolate (Cellcept) Roma Kayser, or Rinvoq  Get an annual skin examination to screen for skin cancer while you are on Cimzia

## 2022-07-25 LAB — CBC WITH DIFFERENTIAL/PLATELET
Absolute Monocytes: 433 cells/uL (ref 200–950)
Basophils Absolute: 61 cells/uL (ref 0–200)
Basophils Relative: 1 %
Eosinophils Absolute: 73 cells/uL (ref 15–500)
Eosinophils Relative: 1.2 %
HCT: 38.2 % (ref 35.0–45.0)
Hemoglobin: 13.5 g/dL (ref 11.7–15.5)
Lymphs Abs: 1989 cells/uL (ref 850–3900)
MCH: 32.8 pg (ref 27.0–33.0)
MCHC: 35.3 g/dL (ref 32.0–36.0)
MCV: 92.7 fL (ref 80.0–100.0)
MPV: 8.5 fL (ref 7.5–12.5)
Monocytes Relative: 7.1 %
Neutro Abs: 3544 cells/uL (ref 1500–7800)
Neutrophils Relative %: 58.1 %
Platelets: 299 10*3/uL (ref 140–400)
RBC: 4.12 10*6/uL (ref 3.80–5.10)
RDW: 12.9 % (ref 11.0–15.0)
Total Lymphocyte: 32.6 %
WBC: 6.1 10*3/uL (ref 3.8–10.8)

## 2022-07-25 LAB — COMPLETE METABOLIC PANEL WITH GFR
AG Ratio: 1.8 (calc) (ref 1.0–2.5)
ALT: 18 U/L (ref 6–29)
AST: 30 U/L (ref 10–35)
Albumin: 4.6 g/dL (ref 3.6–5.1)
Alkaline phosphatase (APISO): 115 U/L (ref 37–153)
BUN: 10 mg/dL (ref 7–25)
CO2: 30 mmol/L (ref 20–32)
Calcium: 9.9 mg/dL (ref 8.6–10.4)
Chloride: 94 mmol/L — ABNORMAL LOW (ref 98–110)
Creat: 0.65 mg/dL (ref 0.60–0.95)
Globulin: 2.6 g/dL (calc) (ref 1.9–3.7)
Glucose, Bld: 84 mg/dL (ref 65–99)
Potassium: 3.8 mmol/L (ref 3.5–5.3)
Sodium: 131 mmol/L — ABNORMAL LOW (ref 135–146)
Total Bilirubin: 0.5 mg/dL (ref 0.2–1.2)
Total Protein: 7.2 g/dL (ref 6.1–8.1)
eGFR: 88 mL/min/{1.73_m2} (ref >=60)

## 2022-07-25 NOTE — Progress Notes (Signed)
CBC and CMP normal

## 2022-08-08 ENCOUNTER — Ambulatory Visit: Payer: MEDICARE | Attending: Rheumatology | Admitting: *Deleted

## 2022-08-08 VITALS — BP 152/67 | HR 73

## 2022-08-08 DIAGNOSIS — M0609 Rheumatoid arthritis without rheumatoid factor, multiple sites: Secondary | ICD-10-CM | POA: Diagnosis present

## 2022-08-08 MED ORDER — CERTOLIZUMAB PEGOL 2 X 200 MG ~~LOC~~ KIT
400.0000 mg | PACK | Freq: Once | SUBCUTANEOUS | Status: AC
  Administered 2022-08-08: 400 mg via SUBCUTANEOUS

## 2022-08-08 NOTE — Progress Notes (Signed)
Subjective:   Patient presents to clinic today to receive monthly dose of Cimzia.  Patient running a fever or have signs/symptoms of infection? No  Patient currently on antibiotics for the treatment of infection? No  Patient have any upcoming invasive procedures/surgeries? No  Objective: CMP     Component Value Date/Time   NA 131 (L) 07/24/2022 1425   NA 138 02/20/2016 0000   K 3.8 07/24/2022 1425   CL 94 (L) 07/24/2022 1425   CO2 30 07/24/2022 1425   GLUCOSE 84 07/24/2022 1425   BUN 10 07/24/2022 1425   BUN 13 02/20/2016 0000   CREATININE 0.65 07/24/2022 1425   CALCIUM 9.9 07/24/2022 1425   PROT 7.2 07/24/2022 1425   ALBUMIN 4.5 05/14/2016 1314   AST 30 07/24/2022 1425   ALT 18 07/24/2022 1425   ALKPHOS 59 05/14/2016 1314   BILITOT 0.5 07/24/2022 1425   GFRNONAA 72 11/20/2020 1401   GFRAA 83 11/20/2020 1401    CBC    Component Value Date/Time   WBC 6.1 07/24/2022 1425   RBC 4.12 07/24/2022 1425   HGB 13.5 07/24/2022 1425   HCT 38.2 07/24/2022 1425   PLT 299 07/24/2022 1425   MCV 92.7 07/24/2022 1425   MCH 32.8 07/24/2022 1425   MCHC 35.3 07/24/2022 1425   RDW 12.9 07/24/2022 1425   LYMPHSABS 1,989 07/24/2022 1425   MONOABS 434 05/14/2016 1314   EOSABS 73 07/24/2022 1425   BASOSABS 61 07/24/2022 1425    Baseline Immunosuppressant Therapy Labs TB GOLD    Latest Ref Rng & Units 04/17/2022    2:58 PM  Quantiferon TB Gold  Quantiferon TB Gold Plus NEGATIVE NEGATIVE    Hepatitis Panel    Latest Ref Rng & Units 03/05/2018    1:53 PM  Hepatitis  Hep B Surface Ag NON-REACTI NON-REACTIVE   Hep B IgM NON-REACTI NON-REACTIVE   Hep C Ab NON-REACTI NON-REACTIVE    HIV Lab Results  Component Value Date   HIV NON-REACTIVE 03/05/2018   Immunoglobulins    Latest Ref Rng & Units 03/05/2018    1:53 PM  Immunoglobulin Electrophoresis  IgA  20 - 320 mg/dL 119   IgG 600 - 1,540 mg/dL 844   IgM 50 - 300 mg/dL 156    SPEP    Latest Ref Rng & Units 07/24/2022     2:25 PM  Serum Protein Electrophoresis  Total Protein 6.1 - 8.1 g/dL 7.2    G6PD Lab Results  Component Value Date   G6PDH 17.9 11/19/2017   TPMT No results found for: "TPMT"   Chest x-ray: 10/2005    Assessment/Plan:   Administrations This Visit     certolizumab pegol (CIMZIA) kit 400 mg     Admin Date 08/08/2022 Action Given Dose 400 mg Route Subcutaneous Administered By Carole Binning, LPN             Patient tolerated injection well.   Appointment for next injection scheduled for 09/05/2022.  Patient due for labs in May 2024.  Patient is to call and reschedule appointment if running a fever with signs/symptoms of infection, on antibiotics for active infection or has an upcoming invasive procedure.  All questions encouraged and answered.  Instructed patient to call with any further questions or concerns.

## 2022-09-05 ENCOUNTER — Ambulatory Visit: Payer: MEDICARE | Attending: Rheumatology | Admitting: *Deleted

## 2022-09-05 VITALS — BP 137/75 | HR 75

## 2022-09-05 DIAGNOSIS — M0609 Rheumatoid arthritis without rheumatoid factor, multiple sites: Secondary | ICD-10-CM | POA: Insufficient documentation

## 2022-09-05 MED ORDER — CERTOLIZUMAB PEGOL 2 X 200 MG ~~LOC~~ KIT
400.0000 mg | PACK | Freq: Once | SUBCUTANEOUS | Status: AC
  Administered 2022-09-05: 400 mg via SUBCUTANEOUS

## 2022-09-05 NOTE — Progress Notes (Signed)
Subjective:   Patient presents to clinic today to receive monthly dose of Cimzia.  Patient running a fever or have signs/symptoms of infection? No  Patient currently on antibiotics for the treatment of infection? No  Patient have any upcoming invasive procedures/surgeries? No  Objective: CMP     Component Value Date/Time   NA 131 (L) 07/24/2022 1425   NA 138 02/20/2016 0000   K 3.8 07/24/2022 1425   CL 94 (L) 07/24/2022 1425   CO2 30 07/24/2022 1425   GLUCOSE 84 07/24/2022 1425   BUN 10 07/24/2022 1425   BUN 13 02/20/2016 0000   CREATININE 0.65 07/24/2022 1425   CALCIUM 9.9 07/24/2022 1425   PROT 7.2 07/24/2022 1425   ALBUMIN 4.5 05/14/2016 1314   AST 30 07/24/2022 1425   ALT 18 07/24/2022 1425   ALKPHOS 59 05/14/2016 1314   BILITOT 0.5 07/24/2022 1425   GFRNONAA 72 11/20/2020 1401   GFRAA 83 11/20/2020 1401    CBC    Component Value Date/Time   WBC 6.1 07/24/2022 1425   RBC 4.12 07/24/2022 1425   HGB 13.5 07/24/2022 1425   HCT 38.2 07/24/2022 1425   PLT 299 07/24/2022 1425   MCV 92.7 07/24/2022 1425   MCH 32.8 07/24/2022 1425   MCHC 35.3 07/24/2022 1425   RDW 12.9 07/24/2022 1425   LYMPHSABS 1,989 07/24/2022 1425   MONOABS 434 05/14/2016 1314   EOSABS 73 07/24/2022 1425   BASOSABS 61 07/24/2022 1425    Baseline Immunosuppressant Therapy Labs TB GOLD    Latest Ref Rng & Units 04/17/2022    2:58 PM  Quantiferon TB Gold  Quantiferon TB Gold Plus NEGATIVE NEGATIVE    Hepatitis Panel    Latest Ref Rng & Units 03/05/2018    1:53 PM  Hepatitis  Hep B Surface Ag NON-REACTI NON-REACTIVE   Hep B IgM NON-REACTI NON-REACTIVE   Hep C Ab NON-REACTI NON-REACTIVE    HIV Lab Results  Component Value Date   HIV NON-REACTIVE 03/05/2018   Immunoglobulins    Latest Ref Rng & Units 03/05/2018    1:53 PM  Immunoglobulin Electrophoresis  IgA  20 - 320 mg/dL 119   IgG 600 - 1,540 mg/dL 844   IgM 50 - 300 mg/dL 156    SPEP    Latest Ref Rng & Units 07/24/2022     2:25 PM  Serum Protein Electrophoresis  Total Protein 6.1 - 8.1 g/dL 7.2    G6PD Lab Results  Component Value Date   G6PDH 17.9 11/19/2017   TPMT No results found for: "TPMT"   Chest x-ray: 10/2005   Assessment/Plan:   Administrations This Visit     certolizumab pegol (CIMZIA) kit 400 mg     Admin Date 09/05/2022 Action Given Dose 400 mg Route Subcutaneous Administered By Carole Binning, LPN             Patient tolerated injection well.   Appointment for next injection scheduled for 10/03/2022.  Patient due for labs in May 2024.  Patient is to call and reschedule appointment if running a fever with signs/symptoms of infection, on antibiotics for active infection or has an upcoming invasive procedure.  All questions encouraged and answered.  Instructed patient to call with any further questions or concerns.

## 2022-09-24 ENCOUNTER — Other Ambulatory Visit: Payer: Self-pay | Admitting: Physician Assistant

## 2022-09-25 NOTE — Telephone Encounter (Signed)
Last Fill: 07/17/2022  Labs: 07/24/2022 CBC and CMP normal.   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

## 2022-10-03 ENCOUNTER — Ambulatory Visit: Payer: MEDICARE | Attending: Rheumatology | Admitting: *Deleted

## 2022-10-03 DIAGNOSIS — M0609 Rheumatoid arthritis without rheumatoid factor, multiple sites: Secondary | ICD-10-CM

## 2022-10-03 MED ORDER — CERTOLIZUMAB PEGOL 2 X 200 MG ~~LOC~~ KIT
400.0000 mg | PACK | Freq: Once | SUBCUTANEOUS | Status: AC
  Administered 2022-10-03: 400 mg via SUBCUTANEOUS

## 2022-10-03 NOTE — Progress Notes (Signed)
Subjective:   Patient presents to clinic today to receive monthly dose of Cimzia.  Patient running a fever or have signs/symptoms of infection? No  Patient currently on antibiotics for the treatment of infection? No  Patient have any upcoming invasive procedures/surgeries? No  Objective: CMP     Component Value Date/Time   NA 131 (L) 07/24/2022 1425   NA 138 02/20/2016 0000   K 3.8 07/24/2022 1425   CL 94 (L) 07/24/2022 1425   CO2 30 07/24/2022 1425   GLUCOSE 84 07/24/2022 1425   BUN 10 07/24/2022 1425   BUN 13 02/20/2016 0000   CREATININE 0.65 07/24/2022 1425   CALCIUM 9.9 07/24/2022 1425   PROT 7.2 07/24/2022 1425   ALBUMIN 4.5 05/14/2016 1314   AST 30 07/24/2022 1425   ALT 18 07/24/2022 1425   ALKPHOS 59 05/14/2016 1314   BILITOT 0.5 07/24/2022 1425   GFRNONAA 72 11/20/2020 1401   GFRAA 83 11/20/2020 1401    CBC    Component Value Date/Time   WBC 6.1 07/24/2022 1425   RBC 4.12 07/24/2022 1425   HGB 13.5 07/24/2022 1425   HCT 38.2 07/24/2022 1425   PLT 299 07/24/2022 1425   MCV 92.7 07/24/2022 1425   MCH 32.8 07/24/2022 1425   MCHC 35.3 07/24/2022 1425   RDW 12.9 07/24/2022 1425   LYMPHSABS 1,989 07/24/2022 1425   MONOABS 434 05/14/2016 1314   EOSABS 73 07/24/2022 1425   BASOSABS 61 07/24/2022 1425    Baseline Immunosuppressant Therapy Labs TB GOLD    Latest Ref Rng & Units 04/17/2022    2:58 PM  Quantiferon TB Gold  Quantiferon TB Gold Plus NEGATIVE NEGATIVE    Hepatitis Panel    Latest Ref Rng & Units 03/05/2018    1:53 PM  Hepatitis  Hep B Surface Ag NON-REACTI NON-REACTIVE   Hep B IgM NON-REACTI NON-REACTIVE   Hep C Ab NON-REACTI NON-REACTIVE    HIV Lab Results  Component Value Date   HIV NON-REACTIVE 03/05/2018   Immunoglobulins    Latest Ref Rng & Units 03/05/2018    1:53 PM  Immunoglobulin Electrophoresis  IgA  20 - 320 mg/dL 119   IgG 147 - 8,295 mg/dL 621   IgM 50 - 308 mg/dL 657    SPEP    Latest Ref Rng & Units 07/24/2022     2:25 PM  Serum Protein Electrophoresis  Total Protein 6.1 - 8.1 g/dL 7.2    Q4ON Lab Results  Component Value Date   G6PDH 17.9 11/19/2017   TPMT No results found for: "TPMT"   Chest x-ray:  10/2005    Assessment/Plan:   Administrations This Visit     certolizumab pegol (CIMZIA) kit 400 mg     Admin Date 10/03/2022 Action Given Dose 400 mg Route Subcutaneous Administered By Henriette Combs, LPN             Patient tolerated injection well.   Appointment for next injection scheduled for 10/31/2022.  Patient due for labs in May 2024.  Patient is to call and reschedule appointment if running a fever with signs/symptoms of infection, on antibiotics for active infection or has an upcoming invasive procedure.  All questions encouraged and answered.  Instructed patient to call with any further questions or concerns.

## 2022-10-31 ENCOUNTER — Ambulatory Visit: Payer: BLUE CROSS/BLUE SHIELD

## 2022-11-04 ENCOUNTER — Telehealth: Payer: Self-pay | Admitting: *Deleted

## 2022-11-04 NOTE — Telephone Encounter (Signed)
Attempted to contact the patient to reschedule her Cimzia injection. Left message to see if patient would be available to come in on 11/06/2022 at 2:00 pm for her injection. Requested patient to call the office to let us know.

## 2022-11-07 ENCOUNTER — Telehealth: Payer: Self-pay

## 2022-11-07 NOTE — Telephone Encounter (Signed)
Patient called to schedule Cimzia and requested a call back. Patient stated that if she does not answer just to leave a VM.

## 2022-11-08 NOTE — Telephone Encounter (Signed)
Patient advised I will have to wait to reschedule her injection as I have a jury summons and may have to serve on jury duty next week. Patient advised I will call her next week to get her reschedule as soon as I am aware if I will be in the office. Patient expressed understanding.

## 2022-11-19 ENCOUNTER — Ambulatory Visit: Payer: MEDICARE | Attending: Rheumatology | Admitting: *Deleted

## 2022-11-19 VITALS — BP 156/91 | HR 76

## 2022-11-19 DIAGNOSIS — Z79899 Other long term (current) drug therapy: Secondary | ICD-10-CM | POA: Diagnosis present

## 2022-11-19 DIAGNOSIS — M0609 Rheumatoid arthritis without rheumatoid factor, multiple sites: Secondary | ICD-10-CM | POA: Diagnosis present

## 2022-11-19 LAB — CBC WITH DIFFERENTIAL/PLATELET
Eosinophils Absolute: 92 cells/uL (ref 15–500)
Eosinophils Relative: 1.5 %
MCHC: 34.3 g/dL (ref 32.0–36.0)
MPV: 8.4 fL (ref 7.5–12.5)
Monocytes Relative: 6.7 %
Neutro Abs: 3928 cells/uL (ref 1500–7800)
RBC: 3.76 10*6/uL — ABNORMAL LOW (ref 3.80–5.10)
RDW: 13.6 % (ref 11.0–15.0)
Total Lymphocyte: 26.3 %

## 2022-11-19 MED ORDER — CERTOLIZUMAB PEGOL 2 X 200 MG ~~LOC~~ KIT
400.0000 mg | PACK | Freq: Once | SUBCUTANEOUS | Status: AC
Start: 2022-11-19 — End: 2022-11-19
  Administered 2022-11-19: 400 mg via SUBCUTANEOUS

## 2022-11-19 NOTE — Progress Notes (Signed)
Subjective:   Patient presents to clinic today to receive monthly dose of Cimzia.  Patient running a fever or have signs/symptoms of infection? No  Patient currently on antibiotics for the treatment of infection? No  Patient have any upcoming invasive procedures/surgeries? No  Objective: CMP     Component Value Date/Time   NA 131 (L) 07/24/2022 1425   NA 138 02/20/2016 0000   K 3.8 07/24/2022 1425   CL 94 (L) 07/24/2022 1425   CO2 30 07/24/2022 1425   GLUCOSE 84 07/24/2022 1425   BUN 10 07/24/2022 1425   BUN 13 02/20/2016 0000   CREATININE 0.65 07/24/2022 1425   CALCIUM 9.9 07/24/2022 1425   PROT 7.2 07/24/2022 1425   ALBUMIN 4.5 05/14/2016 1314   AST 30 07/24/2022 1425   ALT 18 07/24/2022 1425   ALKPHOS 59 05/14/2016 1314   BILITOT 0.5 07/24/2022 1425   GFRNONAA 72 11/20/2020 1401   GFRAA 83 11/20/2020 1401    CBC    Component Value Date/Time   WBC 6.1 07/24/2022 1425   RBC 4.12 07/24/2022 1425   HGB 13.5 07/24/2022 1425   HCT 38.2 07/24/2022 1425   PLT 299 07/24/2022 1425   MCV 92.7 07/24/2022 1425   MCH 32.8 07/24/2022 1425   MCHC 35.3 07/24/2022 1425   RDW 12.9 07/24/2022 1425   LYMPHSABS 1,989 07/24/2022 1425   MONOABS 434 05/14/2016 1314   EOSABS 73 07/24/2022 1425   BASOSABS 61 07/24/2022 1425    Baseline Immunosuppressant Therapy Labs TB GOLD    Latest Ref Rng & Units 04/17/2022    2:58 PM  Quantiferon TB Gold  Quantiferon TB Gold Plus NEGATIVE NEGATIVE    Hepatitis Panel    Latest Ref Rng & Units 03/05/2018    1:53 PM  Hepatitis  Hep B Surface Ag NON-REACTI NON-REACTIVE   Hep B IgM NON-REACTI NON-REACTIVE   Hep C Ab NON-REACTI NON-REACTIVE    HIV Lab Results  Component Value Date   HIV NON-REACTIVE 03/05/2018   Immunoglobulins    Latest Ref Rng & Units 03/05/2018    1:53 PM  Immunoglobulin Electrophoresis  IgA  20 - 320 mg/dL 409   IgG 811 - 9,147 mg/dL 829   IgM 50 - 562 mg/dL 130    SPEP    Latest Ref Rng & Units 07/24/2022     2:25 PM  Serum Protein Electrophoresis  Total Protein 6.1 - 8.1 g/dL 7.2    Q6VH Lab Results  Component Value Date   G6PDH 17.9 11/19/2017   TPMT No results found for: "TPMT"   Chest x-ray: 10/2005    Assessment/Plan:   Administrations This Visit     certolizumab pegol (CIMZIA) kit 400 mg     Admin Date 11/19/2022 Action Given Dose 400 mg Route Subcutaneous Administered By Henriette Combs, LPN             Patient tolerated injection well .   Appointment for next injection scheduled for 12/17/2022.  Patient due for labs today and were drawn while in office.  Patient is to call and reschedule appointment if running a fever with signs/symptoms of infection, on antibiotics for active infection or has an upcoming invasive procedure.  All questions encouraged and answered.  Instructed patient to call with any further questions or concerns.

## 2022-11-20 LAB — CBC WITH DIFFERENTIAL/PLATELET
Absolute Monocytes: 409 cells/uL (ref 200–950)
Basophils Absolute: 67 cells/uL (ref 0–200)
Basophils Relative: 1.1 %
HCT: 36.2 % (ref 35.0–45.0)
Hemoglobin: 12.4 g/dL (ref 11.7–15.5)
Lymphs Abs: 1604 cells/uL (ref 850–3900)
MCH: 33 pg (ref 27.0–33.0)
MCV: 96.3 fL (ref 80.0–100.0)
Neutrophils Relative %: 64.4 %
Platelets: 279 10*3/uL (ref 140–400)
WBC: 6.1 10*3/uL (ref 3.8–10.8)

## 2022-11-20 LAB — COMPLETE METABOLIC PANEL WITH GFR
AG Ratio: 2.2 (calc) (ref 1.0–2.5)
ALT: 19 U/L (ref 6–29)
AST: 30 U/L (ref 10–35)
Albumin: 4.7 g/dL (ref 3.6–5.1)
Alkaline phosphatase (APISO): 99 U/L (ref 37–153)
BUN: 15 mg/dL (ref 7–25)
CO2: 28 mmol/L (ref 20–32)
Calcium: 9.7 mg/dL (ref 8.6–10.4)
Chloride: 94 mmol/L — ABNORMAL LOW (ref 98–110)
Creat: 0.66 mg/dL (ref 0.60–0.95)
Globulin: 2.1 g/dL (calc) (ref 1.9–3.7)
Glucose, Bld: 82 mg/dL (ref 65–99)
Potassium: 3.4 mmol/L — ABNORMAL LOW (ref 3.5–5.3)
Sodium: 131 mmol/L — ABNORMAL LOW (ref 135–146)
Total Bilirubin: 0.6 mg/dL (ref 0.2–1.2)
Total Protein: 6.8 g/dL (ref 6.1–8.1)
eGFR: 88 mL/min/{1.73_m2} (ref >=60)

## 2022-11-20 NOTE — Progress Notes (Signed)
Sodium and potassium are low CBC is stable.  Please notify patient of low potassium.  Please forward results to her PCP.

## 2022-12-02 ENCOUNTER — Other Ambulatory Visit: Payer: Self-pay | Admitting: Physician Assistant

## 2022-12-02 NOTE — Telephone Encounter (Signed)
Last Fill: 09/25/2022  Labs: 11/19/2022 Sodium and potassium are low CBC is stable.     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

## 2022-12-12 NOTE — Progress Notes (Signed)
Office Visit Note  Patient: April Shepard             Date of Birth: 13-Jun-1941           MRN: [id number]             PCP: Charlene Brooke, MD Referring: Colbert Coyer, MD Visit Date: 12/26/2022 Occupation: @GUAROCC @  Subjective:  Due for in-office administered Cimzia  History of Present Illness: April Shepard is a 82 y.o. female with history of seronegative rheumatoid arthritis and osteoarthritis.  Patient remains on In-office Cimzia 400 mg sq injection every 28 days, methotrexate 2.5 mg, 5 tablets every 7 days, and folic acid 1 mg, 1 tablet p.o. daily.  She is tolerating methotrexate without any side effects.  She has not missed any doses recently. She is due for her next Cimzia injection today.  She denies any recent rheumatoid arthritis flares.  She has noticed some increased swelling in the right knee but is not having any discomfort at this time.  She denies any other joint inflammation.  She denies any morning stiffness.   She denies any new medical conditions.  She denies any recent or recurrent infections.   Activities of Daily Living:  Patient reports morning stiffness for 0 minutes.   Patient Denies nocturnal pain.  Difficulty dressing/grooming: Denies Difficulty climbing stairs: Denies Difficulty getting out of chair: Denies Difficulty using hands for taps, buttons, cutlery, and/or writing: Denies  Review of Systems  Constitutional:  Negative for fatigue.  HENT:  Negative for mouth sores, mouth dryness and nose dryness.   Eyes:  Negative for pain, visual disturbance and dryness.  Respiratory:  Negative for cough, hemoptysis, shortness of breath and difficulty breathing.   Cardiovascular:  Negative for chest pain, palpitations, hypertension and swelling in legs/feet.  Gastrointestinal:  Negative for blood in stool, constipation and diarrhea.  Endocrine: Negative for increased urination.  Genitourinary:  Negative for painful urination.   Musculoskeletal:  Negative for joint pain, joint pain, joint swelling, myalgias, muscle weakness, morning stiffness, muscle tenderness and myalgias.  Skin:  Positive for hair loss. Negative for color change, pallor, rash, nodules/bumps, skin tightness, ulcers and sensitivity to sunlight.  Allergic/Immunologic: Negative for susceptible to infections.  Neurological:  Negative for dizziness, numbness, headaches and weakness.  Hematological:  Negative for swollen glands.  Psychiatric/Behavioral:  Negative for depressed mood and sleep disturbance. The patient is not nervous/anxious.     PMFS History:  Patient Active Problem List   Diagnosis Date Noted  . Dyslipidemia 07/23/2018  . 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 High risk medication use 10/22/2016  . Trigger finger, right ring finger 10/22/2016  . Osteopenia 05/13/2016  . Osteoarthritis of both hands 05/13/2016  . RA (rheumatoid arthritis) (HCC) 05/13/2016    Past Medical History:  Diagnosis Date  . Osteoarthritis of both hands 05/13/2016  . Osteopenia 05/13/2016  . RA (rheumatoid arthritis) (HCC) 05/13/2016    Family History  Problem Relation Age of Onset  . Breast cancer Mother   . Heart attack Father   . Melanoma Brother    Past Surgical History:  Procedure Laterality Date  . CHOLECYSTECTOMY     Social History   Social History Narrative  . Not on file   Immunization History  Administered Date(s) Administered  . Influenza, High Dose Seasonal PF 03/27/2018  . Moderna Sars-Covid-2 Vaccination 08/17/2019, 09/14/2019, 05/08/2020     Objective: Vital Signs: BP (!) 159/85 (BP Location: Left Arm, Patient Position:  Sitting, Cuff Size: Small)   Pulse 73   Resp 15   Ht 5\' 2"  (1.575 m)   Wt 125 lb 9.6 oz (57 kg)   BMI 22.97 kg/m    Physical Exam Vitals and nursing note reviewed.  Constitutional:      Appearance: She is well-developed.  HENT:     Head: Normocephalic and atraumatic.   Eyes:     Conjunctiva/sclera: Conjunctivae normal.  Cardiovascular:     Rate and Rhythm: Normal rate and regular rhythm.     Heart sounds: Normal heart sounds.  Pulmonary:     Effort: Pulmonary effort is normal.     Breath sounds: Normal breath sounds.  Abdominal:     General: Bowel sounds are normal.     Palpations: Abdomen is soft.  Musculoskeletal:     Cervical back: Normal range of motion.  Lymphadenopathy:     Cervical: No cervical adenopathy.  Skin:    General: Skin is warm and dry.     Capillary Refill: Capillary refill takes less than 2 seconds.  Neurological:     Mental Status: She is alert and oriented to person, place, and time.  Psychiatric:        Behavior: Behavior normal.     Musculoskeletal Exam: C-spine, thoracic spine, and lumbar spine good ROM.  Shoulder joints, elbow joints, wrist joints, MCPs, PIPs, and DIPs good ROM with no synovitis.  PIP and DIP thickening consistent with OA of both hands.  Hip joints have good ROM with no groin pain. Synovial thickening and fullness in the right knee noted.  Left knee has good ROM with no warmth or effusion.  Ankle joints have good ROM with no tenderness or joint swelling. Overcrowding of toes, left > right.  PIP and DIP thickening consistent with OA of both feet.  No synovitis of MTP joints.   CDAI Exam: CDAI Score: 3  Patient Global: 10 / 100; Provider Global: 10 / 100 Swollen: 1 ; Tender: 0  Joint Exam 12/26/2022      Right  Left  Knee  Swollen         Investigation: No additional findings.  Imaging: No results found.  Recent Labs: Lab Results  Component Value Date   WBC 6.1 11/19/2022   HGB 12.4 11/19/2022   PLT 279 11/19/2022   NA 131 (L) 11/19/2022   K 3.4 (L) 11/19/2022   CL 94 (L) 11/19/2022   CO2 28 11/19/2022   GLUCOSE 82 11/19/2022   BUN 15 11/19/2022   CREATININE 0.66 11/19/2022   BILITOT 0.6 11/19/2022   ALKPHOS 59 05/14/2016   AST 30 11/19/2022   ALT 19 11/19/2022   PROT 6.8  11/19/2022   ALBUMIN 4.5 05/14/2016   CALCIUM 9.7 11/19/2022   GFRAA 83 11/20/2020   QFTBGOLDPLUS NEGATIVE 04/17/2022    Speciality Comments: no baseline biologic labs  Procedures:  No procedures performed Allergies: Patient has no known allergies.    Assessment / Plan:     Visit Diagnoses: Rheumatoid arthritis of multiple sites with negative rheumatoid factor (HCC) - Patient presents today with some synovial thickening and fullness in the right knee joint.  She is not currently experiencing any pain or instability in her right knee.  She has not had any signs or symptoms of a rheumatoid arthritis flare recently.  She has not required a prednisone taper recently.  She remains on an office administered Cimzia 400 mg subcutaneous injections every 28 days along with methotrexate 5 tablets by mouth  once weekly.  She is tolerating combination therapy without any side effects and has not missed any doses recently.  She has not had any recent or recurrent infections. Patient declined a right knee joint cortisone injections and she is not currently experiencing any joint pain.  She will notify us if her symptoms persist or worsen.  She will remain on an office administer Cimzia and methotrexate as prescribed.  She was vies notify us if she develops signs or symptoms of a flare.  She will follow-up in the office in 5 months or sooner if needed. Plan: certolizumab pegol (CIMZIA) kit 400 mg  High risk medication use - In-office Cimzia 400 mg sq injection every 28 days, methotrexate 2.5 mg, 5 tablets every 7 days, and folic acid 1 mg, 1 tablet p.o. daily. CBC and CMP were updated on 11/19/2022.  Her next lab work will be due in September and every 3 months to monitor for drug toxicity. TB Gold negative on 04/17/22. No recent or recurrent infections. Discussed the importance of holding Cimzia and methotrexate if she develops signs or symptoms of an infection and to resume once the infection has completely  cleared.  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  Primary osteoarthritis of both feet: PP and DIP thickening consistent with OA of both feet.  Overcrowding of toes, left foot> right.    Other medical conditions are listed as follows:   Trigger finger, right ring finger  Osteopenia of multiple sites - DEXA is not in Epic.  According to the patient her gynecologist has ordered her bone densities in the past.  Closed nondisplaced fracture of fifth metatarsal bone of left foot, initial encounter  Dyslipidemia  History of Clostridium difficile infection  Orders: No orders of the defined types were placed in this encounter.  Meds ordered this encounter  Medications  . certolizumab pegol (CIMZIA) kit 400 mg     Follow-Up Instructions: Return in about 5 months (around 05/28/2023) for Rheumatoid arthritis, Osteoarthritis.   Gearldine Bienenstock, PA-C  Note - This record has been created using Dragon software.  Chart creation errors have been sought, but may not always  have been located. Such creation errors do not reflect on  the standard of medical care.

## 2022-12-26 ENCOUNTER — Ambulatory Visit: Payer: MEDICARE | Attending: Physician Assistant | Admitting: Physician Assistant

## 2022-12-26 ENCOUNTER — Encounter: Payer: Self-pay | Admitting: Physician Assistant

## 2022-12-26 VITALS — BP 159/85 | HR 73 | Resp 15 | Ht 62.0 in | Wt 125.6 lb

## 2022-12-26 DIAGNOSIS — M19071 Primary osteoarthritis, right ankle and foot: Secondary | ICD-10-CM | POA: Insufficient documentation

## 2022-12-26 DIAGNOSIS — E785 Hyperlipidemia, unspecified: Secondary | ICD-10-CM | POA: Diagnosis present

## 2022-12-26 DIAGNOSIS — M65341 Trigger finger, right ring finger: Secondary | ICD-10-CM | POA: Insufficient documentation

## 2022-12-26 DIAGNOSIS — M19042 Primary osteoarthritis, left hand: Secondary | ICD-10-CM | POA: Insufficient documentation

## 2022-12-26 DIAGNOSIS — M19041 Primary osteoarthritis, right hand: Secondary | ICD-10-CM | POA: Insufficient documentation

## 2022-12-26 DIAGNOSIS — M0609 Rheumatoid arthritis without rheumatoid factor, multiple sites: Secondary | ICD-10-CM | POA: Diagnosis present

## 2022-12-26 DIAGNOSIS — T372X5A Adverse effect of antimalarials and drugs acting on other blood protozoa, initial encounter: Secondary | ICD-10-CM | POA: Insufficient documentation

## 2022-12-26 DIAGNOSIS — Z8619 Personal history of other infectious and parasitic diseases: Secondary | ICD-10-CM | POA: Diagnosis present

## 2022-12-26 DIAGNOSIS — S92355A Nondisplaced fracture of fifth metatarsal bone, left foot, initial encounter for closed fracture: Secondary | ICD-10-CM | POA: Diagnosis present

## 2022-12-26 DIAGNOSIS — H35389 Toxic maculopathy, unspecified eye: Secondary | ICD-10-CM | POA: Diagnosis present

## 2022-12-26 DIAGNOSIS — M19072 Primary osteoarthritis, left ankle and foot: Secondary | ICD-10-CM | POA: Insufficient documentation

## 2022-12-26 DIAGNOSIS — M8589 Other specified disorders of bone density and structure, multiple sites: Secondary | ICD-10-CM | POA: Insufficient documentation

## 2022-12-26 DIAGNOSIS — Z79899 Other long term (current) drug therapy: Secondary | ICD-10-CM | POA: Diagnosis present

## 2022-12-26 MED ORDER — CERTOLIZUMAB PEGOL 2 X 200 MG ~~LOC~~ KIT
400.0000 mg | PACK | Freq: Once | SUBCUTANEOUS | Status: AC
Start: 2022-12-26 — End: 2022-12-26
  Administered 2022-12-26: 400 mg via SUBCUTANEOUS

## 2022-12-26 NOTE — Progress Notes (Signed)
Subjective:   Patient presents to clinic today to receive monthly dose of Cimzia.  Patient running a fever or have signs/symptoms of infection? No  Patient currently on antibiotics for the treatment of infection? No  Patient have any upcoming invasive procedures/surgeries? No  Objective: CMP     Component Value Date/Time   NA 131 (L) 11/19/2022 1418   NA 138 02/20/2016 0000   K 3.4 (L) 11/19/2022 1418   CL 94 (L) 11/19/2022 1418   CO2 28 11/19/2022 1418   GLUCOSE 82 11/19/2022 1418   BUN 15 11/19/2022 1418   BUN 13 02/20/2016 0000   CREATININE 0.66 11/19/2022 1418   CALCIUM 9.7 11/19/2022 1418   PROT 6.8 11/19/2022 1418   ALBUMIN 4.5 05/14/2016 1314   AST 30 11/19/2022 1418   ALT 19 11/19/2022 1418   ALKPHOS 59 05/14/2016 1314   BILITOT 0.6 11/19/2022 1418   GFRNONAA 72 11/20/2020 1401   GFRAA 83 11/20/2020 1401    CBC    Component Value Date/Time   WBC 6.1 11/19/2022 1418   RBC 3.76 (L) 11/19/2022 1418   HGB 12.4 11/19/2022 1418   HCT 36.2 11/19/2022 1418   PLT 279 11/19/2022 1418   MCV 96.3 11/19/2022 1418   MCH 33.0 11/19/2022 1418   MCHC 34.3 11/19/2022 1418   RDW 13.6 11/19/2022 1418   LYMPHSABS 1,604 11/19/2022 1418   MONOABS 434 05/14/2016 1314   EOSABS 92 11/19/2022 1418   BASOSABS 67 11/19/2022 1418    Baseline Immunosuppressant Therapy Labs TB GOLD    Latest Ref Rng & Units 04/17/2022    2:58 PM  Quantiferon TB Gold  Quantiferon TB Gold Plus NEGATIVE NEGATIVE    Hepatitis Panel    Latest Ref Rng & Units 03/05/2018    1:53 PM  Hepatitis  Hep B Surface Ag NON-REACTI NON-REACTIVE   Hep B IgM NON-REACTI NON-REACTIVE   Hep C Ab NON-REACTI NON-REACTIVE    HIV Lab Results  Component Value Date   HIV NON-REACTIVE 03/05/2018   Immunoglobulins    Latest Ref Rng & Units 03/05/2018    1:53 PM  Immunoglobulin Electrophoresis  IgA  20 - 320 mg/dL 960   IgG 454 - 0,981 mg/dL 191   IgM 50 - 478 mg/dL 295    SPEP    Latest Ref Rng & Units  11/19/2022    2:18 PM  Serum Protein Electrophoresis  Total Protein 6.1 - 8.1 g/dL 6.8    A2ZH Lab Results  Component Value Date   G6PDH 17.9 11/19/2017   TPMT No results found for: "TPMT"   Chest x-ray: 10/2005   Assessment/Plan:   Administrations This Visit     certolizumab pegol (CIMZIA) kit 400 mg     Admin Date 12/26/2022 Action Given Dose 400 mg Route Subcutaneous Documented By Henriette Combs, LPN             Patient tolerated injection well.   Appointment for next injection scheduled for January 23, 2023.  Patient due for labs in September 2024.  Patient is to call and reschedule appointment if running a fever with signs/symptoms of infection, on antibiotics for active infection or has an upcoming invasive procedure.  All questions encouraged and answered.  Instructed patient to call with any further questions or concerns.

## 2022-12-26 NOTE — Patient Instructions (Signed)
Standing Labs We placed an order today for your standing lab work.   Please have your standing labs drawn in September and every 3 months   Please have your labs drawn 2 weeks prior to your appointment so that the provider can discuss your lab results at your appointment, if possible.  Please note that you may see your imaging and lab results in MyChart before we have reviewed them. We will contact you once all results are reviewed. Please allow our office up to 72 hours to thoroughly review all of the results before contacting the office for clarification of your results.  WALK-IN LAB HOURS  Monday through Thursday from 8:00 am -12:30 pm and 1:00 pm-5:00 pm and Friday from 8:00 am-12:00 pm.  Patients with office visits requiring labs will be seen before walk-in labs.  You may encounter longer than normal wait times. Please allow additional time. Wait times may be shorter on  Monday and Thursday afternoons.  We do not book appointments for walk-in labs. We appreciate your patience and understanding with our staff.   Labs are drawn by [facility]. Please bring your co-pay at the time of your lab draw.  You may receive a bill from [facility] for your lab work.  Please note if you are on Hydroxychloroquine and and an order has been placed for a Hydroxychloroquine level,  you will need to have it drawn 4 hours or more after your last dose.  If you wish to have your labs drawn at another location, please call the office 24 hours in advance so we can fax the orders.  The office is located at [address], [address], [place], [place] [address]   If you have any questions regarding directions or hours of operation,  please call [phone].   As a reminder, please drink plenty of water prior to coming for your lab work. Thanks!  

## 2023-01-17 ENCOUNTER — Telehealth: Payer: Self-pay

## 2023-01-17 NOTE — Telephone Encounter (Signed)
Patient contacted the office and states she had a fall and fractured her tail bone. Patient states she does not have to be operated on. Patient states she needs to cancel her appointment for 01/23/2023 and will call back when she can reschedule. Patient's call back number is [phone].

## 2023-01-20 NOTE — Telephone Encounter (Signed)
Appointment cancelled

## 2023-01-23 ENCOUNTER — Ambulatory Visit: Payer: BLUE CROSS/BLUE SHIELD

## 2023-01-23 ENCOUNTER — Telehealth: Payer: Self-pay | Admitting: *Deleted

## 2023-01-23 DIAGNOSIS — S32592A Other specified fracture of left pubis, initial encounter for closed fracture: Secondary | ICD-10-CM | POA: Insufficient documentation

## 2023-01-23 NOTE — Telephone Encounter (Signed)
Patient advised should get repeat UA after the UTI has cleared to make sure there is no infection before she resumes methotrexate. She should also get repeat labs (CBC with differential and CMP with GFR) before resuming methotrexate.

## 2023-01-23 NOTE — Telephone Encounter (Signed)
She should get repeat UA after the UTI has cleared to make sure there is no infection before she resumes methotrexate.  She should also get repeat labs (CBC with differential and CMP with GFR) before resuming methotrexate.

## 2023-01-23 NOTE — Telephone Encounter (Signed)
Patient contacted the office stating she has a fracture of the left ramus of pubis. Patient is in a rehab facility in [place], [place] and will be there until she has healed. Patient states she would like to know if she should continue her MTX. Patient states she has also been diagnosed with a UTI. Patient advised she will need to hold her MTX until she has completed the antibiotics and the UTI has cleared. Patient advised to contact the office when she is discharged from the rehab facility so we can get her restarted on Cimzia. Patient expressed understanding.

## 2023-01-28 ENCOUNTER — Telehealth: Payer: Self-pay | Admitting: Rheumatology

## 2023-01-28 NOTE — Telephone Encounter (Signed)
Noted  

## 2023-01-28 NOTE — Telephone Encounter (Signed)
Patient called to let Sue Lush know that she had labwork and started back on her Methotrexate on Sunday, 01/26/23.

## 2023-02-20 ENCOUNTER — Telehealth: Payer: Self-pay | Admitting: *Deleted

## 2023-02-20 NOTE — Telephone Encounter (Signed)
Patient left message requesting a call back.   Attempted to contact the patient and left message for patient to call the office.

## 2023-02-21 ENCOUNTER — Telehealth: Payer: Self-pay | Admitting: Rheumatology

## 2023-02-21 NOTE — Telephone Encounter (Signed)
Patient left a voicemail (10/20/22 at 5:15 pm) stating she was returning Andrea's call.  Patient states she is still in rehab and wanted to fill you in with regards to her medication and when she may be discharged.

## 2023-02-24 NOTE — Telephone Encounter (Signed)
Patient states she may possibly be discharge from rehab this week. Patient advised to call if she is discharged home and then we will work on scheduling her Cimzia injection.

## 2023-02-24 NOTE — Telephone Encounter (Signed)
Attempted to contact the patient and left message for patient to call the office.  

## 2023-03-03 ENCOUNTER — Telehealth: Payer: Self-pay | Admitting: *Deleted

## 2023-03-03 NOTE — Telephone Encounter (Signed)
Patient contacted the office to advise she has just gotten home from the rehab facility. Patient states home health should be coming out tomorrow. Patient will call the office when she is able to schedule her Cimzia injection.

## 2023-03-05 DIAGNOSIS — M810 Age-related osteoporosis without current pathological fracture: Secondary | ICD-10-CM | POA: Insufficient documentation

## 2023-03-05 DIAGNOSIS — Z9181 History of falling: Secondary | ICD-10-CM | POA: Insufficient documentation

## 2023-03-27 ENCOUNTER — Other Ambulatory Visit: Payer: Self-pay | Admitting: Rheumatology

## 2023-03-27 ENCOUNTER — Other Ambulatory Visit: Payer: Self-pay | Admitting: *Deleted

## 2023-03-27 MED ORDER — METHOTREXATE SODIUM 2.5 MG PO TABS: 12.5000 mg | ORAL_TABLET | ORAL | 0 refills | Status: DC

## 2023-03-27 NOTE — Telephone Encounter (Signed)
Refill request received via fax from [facility] [place], [place]  for Methotrexate   Last Fill: 12/02/2022  Labs: 01/23/2023 Creat. 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

## 2023-04-03 ENCOUNTER — Ambulatory Visit: Payer: MEDICARE | Attending: Rheumatology | Admitting: *Deleted

## 2023-04-03 VITALS — BP 119/71 | HR 79

## 2023-04-03 DIAGNOSIS — Z79899 Other long term (current) drug therapy: Secondary | ICD-10-CM

## 2023-04-03 DIAGNOSIS — Z9225 Personal history of immunosupression therapy: Secondary | ICD-10-CM | POA: Diagnosis present

## 2023-04-03 DIAGNOSIS — Z111 Encounter for screening for respiratory tuberculosis: Secondary | ICD-10-CM | POA: Diagnosis present

## 2023-04-03 DIAGNOSIS — M0609 Rheumatoid arthritis without rheumatoid factor, multiple sites: Secondary | ICD-10-CM

## 2023-04-03 MED ORDER — CERTOLIZUMAB PEGOL 2 X 200 MG ~~LOC~~ KIT
400.0000 mg | PACK | Freq: Once | SUBCUTANEOUS | Status: AC
  Administered 2023-04-03: 400 mg via SUBCUTANEOUS

## 2023-04-03 NOTE — Progress Notes (Signed)
Subjective:   Patient presents to clinic today to receive monthly dose of Cimzia.  Patient running a fever or have signs/symptoms of infection? No  Patient currently on antibiotics for the treatment of infection? No  Patient have any upcoming invasive procedures/surgeries? No  Objective: CMP     Component Value Date/Time   NA 131 (L) 11/19/2022 1418   NA 138 02/20/2016 0000   K 3.4 (L) 11/19/2022 1418   CL 94 (L) 11/19/2022 1418   CO2 28 11/19/2022 1418   GLUCOSE 82 11/19/2022 1418   BUN 15 11/19/2022 1418   BUN 13 02/20/2016 0000   CREATININE 0.66 11/19/2022 1418   CALCIUM 9.7 11/19/2022 1418   PROT 6.8 11/19/2022 1418   ALBUMIN 4.5 05/14/2016 1314   AST 30 11/19/2022 1418   ALT 19 11/19/2022 1418   ALKPHOS 59 05/14/2016 1314   BILITOT 0.6 11/19/2022 1418   GFRNONAA 72 11/20/2020 1401   GFRAA 83 11/20/2020 1401    CBC    Component Value Date/Time   WBC 6.1 11/19/2022 1418   RBC 3.76 (L) 11/19/2022 1418   HGB 12.4 11/19/2022 1418   HCT 36.2 11/19/2022 1418   PLT 279 11/19/2022 1418   MCV 96.3 11/19/2022 1418   MCH 33.0 11/19/2022 1418   MCHC 34.3 11/19/2022 1418   RDW 13.6 11/19/2022 1418   LYMPHSABS 1,604 11/19/2022 1418   MONOABS 434 05/14/2016 1314   EOSABS 92 11/19/2022 1418   BASOSABS 67 11/19/2022 1418    Baseline Immunosuppressant Therapy Labs TB GOLD    Latest Ref Rng & Units 04/17/2022    2:58 PM  Quantiferon TB Gold  Quantiferon TB Gold Plus NEGATIVE NEGATIVE    Hepatitis Panel    Latest Ref Rng & Units 03/05/2018    1:53 PM  Hepatitis  Hep B Surface Ag NON-REACTI NON-REACTIVE   Hep B IgM NON-REACTI NON-REACTIVE   Hep C Ab NON-REACTI NON-REACTIVE    HIV Lab Results  Component Value Date   HIV NON-REACTIVE 03/05/2018   Immunoglobulins    Latest Ref Rng & Units 03/05/2018    1:53 PM  Immunoglobulin Electrophoresis  IgA  20 - 320 mg/dL 295   IgG 284 - 1,324 mg/dL 401   IgM 50 - 027 mg/dL 253    SPEP    Latest Ref Rng & Units  11/19/2022    2:18 PM  Serum Protein Electrophoresis  Total Protein 6.1 - 8.1 g/dL 6.8    G6YQ Lab Results  Component Value Date   G6PDH 17.9 11/19/2017   TPMT No results found for: "TPMT"   Chest x-ray:  10/2005    Assessment/Plan:   Administrations This Visit     certolizumab pegol (CIMZIA) kit 400 mg     Admin Date 04/03/2023 Action Given Dose 400 mg Route Subcutaneous Documented By Henriette Combs, LPN             Patient tolerated injection well.   Appointment for next injection scheduled for 05/01/2023.  Patient due for labs and they were drawn in office today.  Patient is to call and reschedule appointment if running a fever with signs/symptoms of infection, on antibiotics for active infection or has an upcoming invasive procedure.  All questions encouraged and answered.  Instructed patient to call with any further questions or concerns.

## 2023-04-04 ENCOUNTER — Other Ambulatory Visit: Payer: Self-pay | Admitting: Rheumatology

## 2023-04-04 NOTE — Progress Notes (Signed)
Alkaline phosphatase is elevated.  CBC is normal.  Please get alkaline phosphatase with fractionation.

## 2023-04-09 LAB — CBC WITH DIFFERENTIAL/PLATELET
Absolute Lymphocytes: 1388 {cells}/uL (ref 850–3900)
Absolute Monocytes: 450 {cells}/uL (ref 200–950)
Basophils Absolute: 60 {cells}/uL (ref 0–200)
Basophils Relative: 0.8 %
Eosinophils Absolute: 60 {cells}/uL (ref 15–500)
Eosinophils Relative: 0.8 %
HCT: 39.1 % (ref 35.0–45.0)
Hemoglobin: 13 g/dL (ref 11.7–15.5)
MCH: 31.9 pg (ref 27.0–33.0)
MCHC: 33.2 g/dL (ref 32.0–36.0)
MCV: 96.1 fL (ref 80.0–100.0)
MPV: 8.7 fL (ref 7.5–12.5)
Monocytes Relative: 6 %
Neutro Abs: 5543 {cells}/uL (ref 1500–7800)
Neutrophils Relative %: 73.9 %
Platelets: 352 10*3/uL (ref 140–400)
RBC: 4.07 10*6/uL (ref 3.80–5.10)
RDW: 13.4 % (ref 11.0–15.0)
Total Lymphocyte: 18.5 %
WBC: 7.5 10*3/uL (ref 3.8–10.8)

## 2023-04-09 LAB — TEST AUTHORIZATION

## 2023-04-09 LAB — QUANTIFERON-TB GOLD PLUS
Mitogen-NIL: 7.22 [IU]/mL
NIL: 0.07 [IU]/mL
QuantiFERON-TB Gold Plus: NEGATIVE
TB1-NIL: <0 [IU]/mL
TB2-NIL: <0 [IU]/mL

## 2023-04-09 LAB — COMPLETE METABOLIC PANEL WITH GFR
AG Ratio: 1.7 (calc) (ref 1.0–2.5)
ALT: 22 U/L (ref 6–29)
AST: 29 U/L (ref 10–35)
Albumin: 4.4 g/dL (ref 3.6–5.1)
Alkaline phosphatase (APISO): 188 U/L — ABNORMAL HIGH (ref 37–153)
BUN: 23 mg/dL (ref 7–25)
CO2: 25 mmol/L (ref 20–32)
Calcium: 9.7 mg/dL (ref 8.6–10.4)
Chloride: 101 mmol/L (ref 98–110)
Creat: 0.66 mg/dL (ref 0.60–0.95)
Globulin: 2.6 g/dL (ref 1.9–3.7)
Glucose, Bld: 76 mg/dL (ref 65–99)
Potassium: 4 mmol/L (ref 3.5–5.3)
Sodium: 135 mmol/L (ref 135–146)
Total Bilirubin: 0.4 mg/dL (ref 0.2–1.2)
Total Protein: 7 g/dL (ref 6.1–8.1)
eGFR: 88 mL/min/{1.73_m2} (ref >=60)

## 2023-04-09 LAB — ALKALINE PHOSPHATASE ISOENZYMES
Alkaline phosphatase (APISO): 173 U/L — ABNORMAL HIGH (ref 37–153)
Bone Isoenzymes: 66 % (ref 28–66)
Intestinal Isoenzymes: 8 % (ref 1–24)
Liver Isoenzymes: 26 % (ref 25–69)
Macrohepatic isoenzymes: 0 % (ref <=0)
Placental isoenzymes: 0 % (ref <=0)

## 2023-04-09 NOTE — Progress Notes (Signed)
Alkaline phosphatase is still mildly elevated.  TB Gold negative, CBC normal.  Will continue to monitor alkaline phosphatase.

## 2023-05-01 ENCOUNTER — Ambulatory Visit: Payer: MEDICARE | Attending: Rheumatology | Admitting: *Deleted

## 2023-05-01 DIAGNOSIS — M0609 Rheumatoid arthritis without rheumatoid factor, multiple sites: Secondary | ICD-10-CM | POA: Insufficient documentation

## 2023-05-01 MED ORDER — CERTOLIZUMAB PEGOL 2 X 200 MG ~~LOC~~ KIT
400.0000 mg | PACK | Freq: Once | SUBCUTANEOUS | Status: AC
Start: 2023-05-01 — End: 2023-05-01
  Administered 2023-05-01: 400 mg via SUBCUTANEOUS

## 2023-05-01 MED ORDER — CERTOLIZUMAB PEGOL 2 X 200 MG ~~LOC~~ KIT
400.0000 mg | PACK | Freq: Once | SUBCUTANEOUS | Status: DC
Start: 2023-05-01 — End: 2023-05-01

## 2023-05-01 NOTE — Progress Notes (Signed)
Subjective:   Patient presents to clinic today to receive monthly dose of Cimzia.  Patient running a fever or have signs/symptoms of infection? No  Patient currently on antibiotics for the treatment of infection? No  Patient have any upcoming invasive procedures/surgeries? No  Objective: CMP     Component Value Date/Time   NA 135 04/03/2023 1418   NA 138 02/20/2016 0000   K 4.0 04/03/2023 1418   CL 101 04/03/2023 1418   CO2 25 04/03/2023 1418   GLUCOSE 76 04/03/2023 1418   BUN 23 04/03/2023 1418   BUN 13 02/20/2016 0000   CREATININE 0.66 04/03/2023 1418   CALCIUM 9.7 04/03/2023 1418   PROT 7.0 04/03/2023 1418   ALBUMIN 4.5 05/14/2016 1314   AST 29 04/03/2023 1418   ALT 22 04/03/2023 1418   ALKPHOS 59 05/14/2016 1314   BILITOT 0.4 04/03/2023 1418   GFRNONAA 72 11/20/2020 1401   GFRAA 83 11/20/2020 1401    CBC    Component Value Date/Time   WBC 7.5 04/03/2023 1418   RBC 4.07 04/03/2023 1418   HGB 13.0 04/03/2023 1418   HCT 39.1 04/03/2023 1418   PLT 352 04/03/2023 1418   MCV 96.1 04/03/2023 1418   MCH 31.9 04/03/2023 1418   MCHC 33.2 04/03/2023 1418   RDW 13.4 04/03/2023 1418   LYMPHSABS 1,604 11/19/2022 1418   MONOABS 434 05/14/2016 1314   EOSABS 60 04/03/2023 1418   BASOSABS 60 04/03/2023 1418    Baseline Immunosuppressant Therapy Labs TB GOLD    Latest Ref Rng & Units 04/03/2023    2:18 PM  Quantiferon TB Gold  Quantiferon TB Gold Plus NEGATIVE NEGATIVE    Hepatitis Panel    Latest Ref Rng & Units 03/05/2018    1:53 PM  Hepatitis  Hep B Surface Ag NON-REACTI NON-REACTIVE   Hep B IgM NON-REACTI NON-REACTIVE   Hep C Ab NON-REACTI NON-REACTIVE    HIV Lab Results  Component Value Date   HIV NON-REACTIVE 03/05/2018   Immunoglobulins    Latest Ref Rng & Units 03/05/2018    1:53 PM  Immunoglobulin Electrophoresis  IgA  20 - 320 mg/dL 469   IgG 629 - 5,284 mg/dL 132   IgM 50 - 440 mg/dL 102    SPEP    Latest Ref Rng & Units 04/03/2023     2:18 PM  Serum Protein Electrophoresis  Total Protein 6.1 - 8.1 g/dL 7.0    V2ZD Lab Results  Component Value Date   G6PDH 17.9 11/19/2017   TPMT No results found for: "TPMT"   Chest x-ray:  10/2005    Assessment/Plan:   Administrations This Visit     certolizumab pegol (CIMZIA) kit 400 mg     Admin Date 05/01/2023 Action Given Dose 400 mg Route Subcutaneous Documented By Henriette Combs, LPN             Patient tolerated injection well.   Appointment for next injection scheduled for 05/29/2023.  Patient due for labs in January 2025.  Patient is to call and reschedule appointment if running a fever with signs/symptoms of infection, on antibiotics for active infection or has an upcoming invasive procedure.  All questions encouraged and answered.  Instructed patient to call with any further questions or concerns.

## 2023-05-21 NOTE — Progress Notes (Signed)
Office Visit Note  Patient: April Shepard             Date of Birth: Feb 14, 1941           MRN: [id number]             PCP: Charlene Brooke, MD Referring: Charlene Brooke, MD Visit Date: 06/04/2023 Occupation: @GUAROCC @  Subjective:  Pain of the Left Wrist    History of Present Illness: April Shepard is a 82 y.o. female with seronegative rheumatoid arthritis and osteoarthritis.  She states she has been having pain and discomfort in her left wrist joint for the last 2 weeks.  She does not recall any injury.  Neck patient states she fell in July while she was in the kitchen and landed on her side.  She states she was seen in the emergency room where she was diagnosed with pelvic fracture.  She was evaluated by an orthopedic surgeon.  She was in a rehab for 2 months and then had some home health for 1 month.  She is recovered from the pelvic fracture .  Prolonged standing causes some discomfort.  She denies any discomfort in her knee joints.  She continues to be on Cimzia 400 mg subcu every 28 days and methotrexate 2.5 mg, 5 tablets weekly along with folic acid 1 mg daily.  She denies any interruption in the treatment.    Activities of Daily Living:  Patient reports morning stiffness for 0 minutes.   Patient Denies nocturnal pain.  Difficulty dressing/grooming: Denies Difficulty climbing stairs: Denies Difficulty getting out of chair: Denies Difficulty using hands for taps, buttons, cutlery, and/or writing: Denies  Review of Systems  Constitutional:  Positive for fatigue.  HENT:  Negative for mouth sores and mouth dryness.   Eyes:  Negative for dryness.  Respiratory:  Negative for shortness of breath.   Cardiovascular:  Negative for chest pain and palpitations.  Gastrointestinal:  Negative for blood in stool, constipation and diarrhea.  Endocrine: Negative for increased urination.  Genitourinary:  Negative for involuntary urination.  Musculoskeletal:  Negative  for joint pain, gait problem, joint pain, joint swelling, myalgias, muscle weakness, morning stiffness, muscle tenderness and myalgias.  Skin:  Negative for color change, rash, hair loss and sensitivity to sunlight.  Allergic/Immunologic: Negative for susceptible to infections.  Neurological:  Negative for dizziness and headaches.  Hematological:  Negative for swollen glands.  Psychiatric/Behavioral:  Negative for depressed mood and sleep disturbance. The patient is nervous/anxious.     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   High risk medication use 10/22/2016   Trigger finger, right ring finger 10/22/2016   Osteopenia 05/13/2016   Osteoarthritis of both hands 05/13/2016   RA (rheumatoid arthritis) (HCC) 05/13/2016    Past Medical History:  Diagnosis Date   Osteoarthritis of both hands 05/13/2016   Osteopenia 05/13/2016   RA (rheumatoid arthritis) (HCC) 05/13/2016    Family History  Problem Relation Age of Onset   Breast cancer Mother    Heart attack Father    Melanoma Brother    Past Surgical History:  Procedure Laterality Date   CHOLECYSTECTOMY     Social History   Social History Narrative   Not on file   Immunization History  Administered Date(s) Administered   Influenza, High Dose Seasonal PF 03/27/2018   Moderna Sars-Covid-2 Vaccination 08/17/2019, 09/14/2019, 05/08/2020  Objective: Vital Signs: BP (!) 158/79 (BP Location: Left Arm, Patient Position: Sitting, Cuff Size: Small)   Pulse 71   Resp 13   Ht 5\' 2"  (1.575 m)   Wt 125 lb 9.6 oz (57 kg)   BMI 22.97 kg/m    Physical Exam Vitals and nursing note reviewed.  Constitutional:      Appearance: She is well-developed.  HENT:     Head: Normocephalic and atraumatic.  Eyes:     Conjunctiva/sclera: Conjunctivae normal.  Cardiovascular:     Rate and Rhythm: Normal rate and regular  rhythm.     Heart sounds: Normal heart sounds.  Pulmonary:     Effort: Pulmonary effort is normal.     Breath sounds: Normal breath sounds.  Abdominal:     General: Bowel sounds are normal.     Palpations: Abdomen is soft.  Musculoskeletal:     Cervical back: Normal range of motion.  Lymphadenopathy:     Cervical: No cervical adenopathy.  Skin:    General: Skin is warm and dry.     Capillary Refill: Capillary refill takes less than 2 seconds.  Neurological:     Mental Status: She is alert and oriented to person, place, and time.  Psychiatric:        Behavior: Behavior normal.      Musculoskeletal Exam: Cervical spine was in good range of motion.  She had no tenderness over thoracic or lumbar spine.  Shoulder joints, elbow joints, wrist joints with good range of motion.  She had tenderness over left CMC joint.  CMC PIP and DIP thickening with no synovitis was noted.  She had contracture of her right fourth PIP joint.  Synovial thickening was noted over MCP joints.  Hip joints with good range of motion.  She had swelling and mild effusion in her right knee joint.  Left knee joint was in good range of motion.  There was no tenderness over ankles or MTPs.  CDAI Exam: CDAI Score: 3  Patient Global: 0 / 100; Provider Global: 20 / 100 Swollen: 1 ; Tender: 0  Joint Exam 06/04/2023      Right  Left  Knee  Swollen         Investigation: No additional findings.  Imaging: No results found.  Recent Labs: Lab Results  Component Value Date   WBC 7.5 04/03/2023   HGB 13.0 04/03/2023   PLT 352 04/03/2023   NA 135 04/03/2023   K 4.0 04/03/2023   CL 101 04/03/2023   CO2 25 04/03/2023   GLUCOSE 76 04/03/2023   BUN 23 04/03/2023   CREATININE 0.66 04/03/2023   BILITOT 0.4 04/03/2023   ALKPHOS 59 05/14/2016   AST 29 04/03/2023   ALT 22 04/03/2023   PROT 7.0 04/03/2023   ALBUMIN 4.5 05/14/2016   CALCIUM 9.7 04/03/2023   GFRAA 83 11/20/2020   QFTBGOLDPLUS NEGATIVE 04/03/2023     Speciality Comments: no baseline biologic labs  Procedures:  No procedures performed Allergies: Patient has no known allergies.   Assessment / Plan:     Visit Diagnoses: Rheumatoid arthritis of multiple sites with negative rheumatoid factor (HCC)-patient denies having a flare of rheumatoid arthritis.  She states recently she has been experiencing some discomfort in her left wrist which she describes over the [facility] joint.  She states it could be related to extra stuff she has been doing recently.  She denies any interruption in the treatment.  She continues to be on Cimzia subcu injections along  with oral methotrexate.  She denies any discomfort in her knees today.  High risk medication use - Cimzia 400 mg sq injection every 28 days, methotrexate 2.5 mg, 5 tablets every 7 days, and folic acid 1 mg, 1 tablet p.o. daily.  Labs obtained on April 03, 2023 CBC and CMP were normal.  TB Gold was negative on April 03, 2023.  Patient was advised to get labs every 3 months to monitor for drug toxicity.  Information regarding immunization was placed in the AVS.  She was advised to hold Cimzia and methotrexate if she develops an infection resume after the infection resolves.  Annual skin examination to screen for skin cancer was advised while she is on Cimzia.  Use of sunscreen and sun protection was discussed.  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  Synovial thickening was noted over MCP joints.  She has contracture of her right fourth PIP joint.  She had bilateral CMC PIP and DIP thickening.  She has been experiencing some discomfort over the left CMC joint.  Use of topical Voltaren gel was discussed.  Swelling of the joint of right knee-she continues to have right knee joint swelling and mild effusion.  I also offered aspiration and injection which she declined today.  Patient would like to watch  at this time.  Primary osteoarthritis of both feet - PIP and DIP thickening consistent with OA of both feet.  Overcrowding of toes, left foot> right.  Proper fitting shoes were advised.  Osteopenia of multiple sites - DEXA is not in Epic.  According to the patient her gynecologist has ordered her bone densities in the past.  I do not have any recent DEXA scan in the system.  Patient does not recall when she had her last DEXA scan.  Patient declined getting DEXA scan in [place].  She will like to get as it in [place] which will be more convenient for her.  She will discuss with her PCP.  Use of calcium rich diet and vitamin D was advised.  Patient had recent pelvic fracture after a fall.  History of recent fall-patient reports having a fall in July 2024.  Patient reports that she was evaluated in the emergency room and was diagnosed with pelvic fracture.  I reviewed records in epic everywhere which shows superior pubic ramus fracture.  Patient states she was in the rehab for about 2 months and then had home health for 1 month.  She is doing better now and ambulating without any assistance.  History of pelvic fracture-January 08, 2023 she was diagnosed with pelvic fracture.  Closed nondisplaced fracture of fifth metatarsal bone of left foot, sequela  Dyslipidemia  History of Clostridium difficile infection  Orders: No orders of the defined types were placed in this encounter.  No orders of the defined types were placed in this encounter.    Follow-Up Instructions: Return in about 5 months (around 11/02/2023) for Rheumatoid arthritis.   Pollyann Savoy, MD  Note - This record has been created using Animal nutritionist.  Chart creation errors have been sought, but may not always  have been located. Such creation errors do not reflect on  the standard of medical care.

## 2023-05-23 ENCOUNTER — Telehealth: Payer: Self-pay | Admitting: Pharmacist

## 2023-05-23 NOTE — Telephone Encounter (Signed)
Called patient regarding Cimzia reverification of benefits. She plans to keep Medicare + BCBS supplemental plan. Anticipates no insurance changes in 2025.  Benefit reverification request submitted on [employer] portal  Chesley Mires, PharmD, MPH, BCPS, CPP Clinical Pharmacist (Rheumatology and Pulmonology)

## 2023-06-04 ENCOUNTER — Encounter: Payer: Self-pay | Admitting: Rheumatology

## 2023-06-04 ENCOUNTER — Ambulatory Visit: Payer: MEDICARE | Attending: Rheumatology | Admitting: Rheumatology

## 2023-06-04 VITALS — BP 149/84 | HR 72 | Resp 13 | Ht 62.0 in | Wt 125.6 lb

## 2023-06-04 DIAGNOSIS — T372X5A Adverse effect of antimalarials and drugs acting on other blood protozoa, initial encounter: Secondary | ICD-10-CM | POA: Diagnosis present

## 2023-06-04 DIAGNOSIS — S92355S Nondisplaced fracture of fifth metatarsal bone, left foot, sequela: Secondary | ICD-10-CM | POA: Insufficient documentation

## 2023-06-04 DIAGNOSIS — Z8781 Personal history of (healed) traumatic fracture: Secondary | ICD-10-CM | POA: Insufficient documentation

## 2023-06-04 DIAGNOSIS — M19072 Primary osteoarthritis, left ankle and foot: Secondary | ICD-10-CM | POA: Diagnosis present

## 2023-06-04 DIAGNOSIS — M19041 Primary osteoarthritis, right hand: Secondary | ICD-10-CM | POA: Diagnosis present

## 2023-06-04 DIAGNOSIS — M8589 Other specified disorders of bone density and structure, multiple sites: Secondary | ICD-10-CM | POA: Insufficient documentation

## 2023-06-04 DIAGNOSIS — M19071 Primary osteoarthritis, right ankle and foot: Secondary | ICD-10-CM | POA: Insufficient documentation

## 2023-06-04 DIAGNOSIS — Z9181 History of falling: Secondary | ICD-10-CM | POA: Diagnosis present

## 2023-06-04 DIAGNOSIS — Z8619 Personal history of other infectious and parasitic diseases: Secondary | ICD-10-CM | POA: Insufficient documentation

## 2023-06-04 DIAGNOSIS — H35389 Toxic maculopathy, unspecified eye: Secondary | ICD-10-CM | POA: Insufficient documentation

## 2023-06-04 DIAGNOSIS — E785 Hyperlipidemia, unspecified: Secondary | ICD-10-CM | POA: Diagnosis present

## 2023-06-04 DIAGNOSIS — M19042 Primary osteoarthritis, left hand: Secondary | ICD-10-CM | POA: Diagnosis present

## 2023-06-04 DIAGNOSIS — Z79899 Other long term (current) drug therapy: Secondary | ICD-10-CM | POA: Diagnosis present

## 2023-06-04 DIAGNOSIS — M0609 Rheumatoid arthritis without rheumatoid factor, multiple sites: Secondary | ICD-10-CM | POA: Diagnosis present

## 2023-06-04 DIAGNOSIS — M25461 Effusion, right knee: Secondary | ICD-10-CM | POA: Diagnosis present

## 2023-06-04 DIAGNOSIS — S92355A Nondisplaced fracture of fifth metatarsal bone, left foot, initial encounter for closed fracture: Secondary | ICD-10-CM

## 2023-06-04 DIAGNOSIS — M65341 Trigger finger, right ring finger: Secondary | ICD-10-CM

## 2023-06-04 MED ORDER — CERTOLIZUMAB PEGOL 2 X 200 MG ~~LOC~~ KIT
400.0000 mg | PACK | Freq: Once | SUBCUTANEOUS | Status: AC
Start: 2023-06-04 — End: 2023-06-04
  Administered 2023-06-04: 400 mg via SUBCUTANEOUS

## 2023-06-04 NOTE — Progress Notes (Signed)
Pharmacy Note  Subjective:   Patient presents to clinic today to receive monthly dose of Cimzia.  Patient running a fever or have signs/symptoms of infection? No  Patient currently on antibiotics for the treatment of infection? No  Patient have any upcoming invasive procedures/surgeries? No  Objective: CMP     Component Value Date/Time   NA 135 04/03/2023 1418   NA 138 02/20/2016 0000   K 4.0 04/03/2023 1418   CL 101 04/03/2023 1418   CO2 25 04/03/2023 1418   GLUCOSE 76 04/03/2023 1418   BUN 23 04/03/2023 1418   BUN 13 02/20/2016 0000   CREATININE 0.66 04/03/2023 1418   CALCIUM 9.7 04/03/2023 1418   PROT 7.0 04/03/2023 1418   ALBUMIN 4.5 05/14/2016 1314   AST 29 04/03/2023 1418   ALT 22 04/03/2023 1418   ALKPHOS 59 05/14/2016 1314   BILITOT 0.4 04/03/2023 1418   GFRNONAA 72 11/20/2020 1401   GFRAA 83 11/20/2020 1401    CBC    Component Value Date/Time   WBC 7.5 04/03/2023 1418   RBC 4.07 04/03/2023 1418   HGB 13.0 04/03/2023 1418   HCT 39.1 04/03/2023 1418   PLT 352 04/03/2023 1418   MCV 96.1 04/03/2023 1418   MCH 31.9 04/03/2023 1418   MCHC 33.2 04/03/2023 1418   RDW 13.4 04/03/2023 1418   LYMPHSABS 1,604 11/19/2022 1418   MONOABS 434 05/14/2016 1314   EOSABS 60 04/03/2023 1418   BASOSABS 60 04/03/2023 1418    Baseline Immunosuppressant Therapy Labs TB GOLD    Latest Ref Rng & Units 04/03/2023    2:18 PM  Quantiferon TB Gold  Quantiferon TB Gold Plus NEGATIVE NEGATIVE    Hepatitis Panel    Latest Ref Rng & Units 03/05/2018    1:53 PM  Hepatitis  Hep B Surface Ag NON-REACTI NON-REACTIVE   Hep B IgM NON-REACTI NON-REACTIVE   Hep C Ab NON-REACTI NON-REACTIVE    HIV Lab Results  Component Value Date   HIV NON-REACTIVE 03/05/2018   Immunoglobulins    Latest Ref Rng & Units 03/05/2018    1:53 PM  Immunoglobulin Electrophoresis  IgA  20 - 320 mg/dL 161   IgG 096 - 0,454 mg/dL 098   IgM 50 - 119 mg/dL 147    SPEP    Latest Ref Rng & Units  04/03/2023    2:18 PM  Serum Protein Electrophoresis  Total Protein 6.1 - 8.1 g/dL 7.0    W2NF Lab Results  Component Value Date   G6PDH 17.9 11/19/2017   TPMT No results found for: "TPMT"   Chest x-ray: 10/2005   Assessment/Plan:   Administrations This Visit     certolizumab pegol (CIMZIA) kit 400 mg     Admin Date 06/04/2023 Action Given Dose 400 mg Route Subcutaneous Documented By Henriette Combs, LPN             Patient tolerated injection well.   Appointment for next injection scheduled for 07/03/2023.  Patient due for labs in January 2025.  Patient is to call and reschedule appointment if running a fever with signs/symptoms of infection, on antibiotics for active infection or has an upcoming invasive procedure.  All questions encouraged and answered.  Instructed patient to call with any further questions or concerns.

## 2023-06-04 NOTE — Patient Instructions (Addendum)
Please schedule DEXA scan through your doctor to assess bone mass density  Standing Labs We placed an order today for your standing lab work.   Please have your standing labs drawn in January and every 3 months  Please have your labs drawn 2 weeks prior to your appointment so that the provider can discuss your lab results at your appointment, if possible.  Please note that you may see your imaging and lab results in MyChart before we have reviewed them. We will contact you once all results are reviewed. Please allow our office up to 72 hours to thoroughly review all of the results before contacting the office for clarification of your results.  WALK-IN LAB HOURS  Monday through Thursday from 8:00 am -12:30 pm and 1:00 pm-5:00 pm and Friday from 8:00 am-12:00 pm.  Patients with office visits requiring labs will be seen before walk-in labs.  You may encounter longer than normal wait times. Please allow additional time. Wait times may be shorter on  Monday and Thursday afternoons.  We do not book appointments for walk-in labs. We appreciate your patience and understanding with our staff.   Labs are drawn by [facility]. Please bring your co-pay at the time of your lab draw.  You may receive a bill from [facility] for your lab work.  Please note if you are on Hydroxychloroquine and and an order has been placed for a Hydroxychloroquine level,  you will need to have it drawn 4 hours or more after your last dose.  If you wish to have your labs drawn at another location, please call the office 24 hours in advance so we can fax the orders.  The office is located at [address], [place], [place] [address]   If you have any questions regarding directions or hours of operation,  please call [phone].   As a reminder, please drink plenty of water prior to coming for your lab work. Thanks!   Vaccines You are taking a medication(s) that can suppress your immune system.  The following  immunizations are recommended: Flu annually Covid-19  RSV Td/Tdap (tetanus, diphtheria, pertussis) every 10 years Pneumonia (Prevnar 15 then Pneumovax 23 at least 1 year apart.  Alternatively, can take Prevnar 20 without needing additional dose) Shingrix: 2 doses from 4 weeks to 6 months apart  Please check with your PCP to make sure you are up to date.   If you have signs or symptoms of an infection or start antibiotics: First, call your PCP for workup of your infection. Hold your medication through the infection, until you complete your antibiotics, and until symptoms resolve if you take the following: Injectable medication (Actemra, Benlysta, Cimzia, Cosentyx, Enbrel, Humira, Kevzara, Orencia, Remicade, Simponi, Stelara, Taltz, Tremfya) Methotrexate Leflunomide (Arava) Mycophenolate (Cellcept) Harriette Ohara, Olumiant, or Rinvoq Please get an annual skin examination to screen for skin cancer while you are on Cimzia.  Please use sunscreen and sun protection.

## 2023-06-30 ENCOUNTER — Other Ambulatory Visit: Payer: Self-pay | Admitting: Rheumatology

## 2023-06-30 NOTE — Telephone Encounter (Signed)
 Last Fill: 03/27/2023  Labs: 04/03/2023 Alkaline phosphatase is elevated.  CBC is normal.   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

## 2023-07-03 ENCOUNTER — Ambulatory Visit: Payer: MEDICARE | Attending: Rheumatology | Admitting: *Deleted

## 2023-07-03 VITALS — BP 166/76 | HR 75

## 2023-07-03 DIAGNOSIS — M0609 Rheumatoid arthritis without rheumatoid factor, multiple sites: Secondary | ICD-10-CM | POA: Diagnosis present

## 2023-07-03 DIAGNOSIS — Z79899 Other long term (current) drug therapy: Secondary | ICD-10-CM

## 2023-07-03 MED ORDER — CERTOLIZUMAB PEGOL 2 X 200 MG ~~LOC~~ KIT
400.0000 mg | PACK | Freq: Once | SUBCUTANEOUS | Status: AC
  Administered 2023-07-03: 400 mg via SUBCUTANEOUS

## 2023-07-03 NOTE — Progress Notes (Signed)
Subjective:   Patient presents to clinic today to receive monthly dose of Cimzia.  Patient running a fever or have signs/symptoms of infection? No  Patient currently on antibiotics for the treatment of infection? No  Patient have any upcoming invasive procedures/surgeries? No  Objective: CMP     Component Value Date/Time   NA 135 04/03/2023 1418   NA 138 02/20/2016 0000   K 4.0 04/03/2023 1418   CL 101 04/03/2023 1418   CO2 25 04/03/2023 1418   GLUCOSE 76 04/03/2023 1418   BUN 23 04/03/2023 1418   BUN 13 02/20/2016 0000   CREATININE 0.66 04/03/2023 1418   CALCIUM 9.7 04/03/2023 1418   PROT 7.0 04/03/2023 1418   ALBUMIN 4.5 05/14/2016 1314   AST 29 04/03/2023 1418   ALT 22 04/03/2023 1418   ALKPHOS 59 05/14/2016 1314   BILITOT 0.4 04/03/2023 1418   GFRNONAA 72 11/20/2020 1401   GFRAA 83 11/20/2020 1401    CBC    Component Value Date/Time   WBC 7.5 04/03/2023 1418   RBC 4.07 04/03/2023 1418   HGB 13.0 04/03/2023 1418   HCT 39.1 04/03/2023 1418   PLT 352 04/03/2023 1418   MCV 96.1 04/03/2023 1418   MCH 31.9 04/03/2023 1418   MCHC 33.2 04/03/2023 1418   RDW 13.4 04/03/2023 1418   LYMPHSABS 1,604 11/19/2022 1418   MONOABS 434 05/14/2016 1314   EOSABS 60 04/03/2023 1418   BASOSABS 60 04/03/2023 1418    Baseline Immunosuppressant Therapy Labs TB GOLD    Latest Ref Rng & Units 04/03/2023    2:18 PM  Quantiferon TB Gold  Quantiferon TB Gold Plus NEGATIVE NEGATIVE    Hepatitis Panel    Latest Ref Rng & Units 03/05/2018    1:53 PM  Hepatitis  Hep B Surface Ag NON-REACTI NON-REACTIVE   Hep B IgM NON-REACTI NON-REACTIVE   Hep C Ab NON-REACTI NON-REACTIVE    HIV Lab Results  Component Value Date   HIV NON-REACTIVE 03/05/2018   Immunoglobulins    Latest Ref Rng & Units 03/05/2018    1:53 PM  Immunoglobulin Electrophoresis  IgA  20 - 320 mg/dL 629   IgG 528 - 4,132 mg/dL 440   IgM 50 - 102 mg/dL 725    SPEP    Latest Ref Rng & Units 04/03/2023     2:18 PM  Serum Protein Electrophoresis  Total Protein 6.1 - 8.1 g/dL 7.0    D6UY Lab Results  Component Value Date   G6PDH 17.9 11/19/2017   TPMT No results found for: "TPMT"   Chest x-ray:  10/2005   Assessment/Plan:   Administrations This Visit     certolizumab pegol (CIMZIA) kit 400 mg     Admin Date 07/03/2023 Action Given Dose 400 mg Route Subcutaneous Documented By Henriette Combs, LPN             Patient tolerated injection well.   Appointment for next injection scheduled for 07/31/2023.  Patient due for labs today and were drawn in office today.  Patient is to call and reschedule appointment if running a fever with signs/symptoms of infection, on antibiotics for active infection or has an upcoming invasive procedure.  All questions encouraged and answered.  Instructed patient to call with any further questions or concerns.

## 2023-07-04 LAB — CBC WITH DIFFERENTIAL/PLATELET
Absolute Lymphocytes: 1981 {cells}/uL (ref 850–3900)
Absolute Monocytes: 476 {cells}/uL (ref 200–950)
Basophils Absolute: 71 {cells}/uL (ref 0–200)
Basophils Relative: 1 %
Eosinophils Absolute: 71 {cells}/uL (ref 15–500)
Eosinophils Relative: 1 %
HCT: 39.1 % (ref 35.0–45.0)
Hemoglobin: 13.1 g/dL (ref 11.7–15.5)
MCH: 32 pg (ref 27.0–33.0)
MCHC: 33.5 g/dL (ref 32.0–36.0)
MCV: 95.6 fL (ref 80.0–100.0)
MPV: 8.6 fL (ref 7.5–12.5)
Monocytes Relative: 6.7 %
Neutro Abs: 4501 {cells}/uL (ref 1500–7800)
Neutrophils Relative %: 63.4 %
Platelets: 313 10*3/uL (ref 140–400)
RBC: 4.09 10*6/uL (ref 3.80–5.10)
RDW: 12.6 % (ref 11.0–15.0)
Total Lymphocyte: 27.9 %
WBC: 7.1 10*3/uL (ref 3.8–10.8)

## 2023-07-04 LAB — COMPLETE METABOLIC PANEL WITH GFR
AG Ratio: 2.3 (calc) (ref 1.0–2.5)
ALT: 18 U/L (ref 6–29)
AST: 29 U/L (ref 10–35)
Albumin: 4.8 g/dL (ref 3.6–5.1)
Alkaline phosphatase (APISO): 150 U/L (ref 37–153)
BUN: 16 mg/dL (ref 7–25)
CO2: 28 mmol/L (ref 20–32)
Calcium: 9.9 mg/dL (ref 8.6–10.4)
Chloride: 99 mmol/L (ref 98–110)
Creat: 0.64 mg/dL (ref 0.60–0.95)
Globulin: 2.1 g/dL (ref 1.9–3.7)
Glucose, Bld: 106 mg/dL — ABNORMAL HIGH (ref 65–99)
Potassium: 4.2 mmol/L (ref 3.5–5.3)
Sodium: 135 mmol/L (ref 135–146)
Total Bilirubin: 0.6 mg/dL (ref 0.2–1.2)
Total Protein: 6.9 g/dL (ref 6.1–8.1)
eGFR: 88 mL/min/{1.73_m2} (ref >=60)

## 2023-07-04 NOTE — Telephone Encounter (Signed)
Received fax from [employer] with verification of benefits for in-office Cimzia. Patient has Medicare + [employer] supplemental plan. The supplemental plan covers the Medicare Part B coinsurance not deductible. Medicare picks up 80% of cost of injection and supplemental plan picks up remaining 20% coinsurance after $257 deductible is met.  Chesley Mires, PharmD, MPH, BCPS, CPP Clinical Pharmacist (Rheumatology and Pulmonology)

## 2023-07-04 NOTE — Progress Notes (Signed)
CBC and CMP are normal.

## 2023-07-31 ENCOUNTER — Ambulatory Visit: Payer: BLUE CROSS/BLUE SHIELD

## 2023-08-05 ENCOUNTER — Ambulatory Visit: Payer: MEDICARE | Attending: Rheumatology | Admitting: *Deleted

## 2023-08-05 VITALS — BP 146/77 | HR 72

## 2023-08-05 DIAGNOSIS — M0609 Rheumatoid arthritis without rheumatoid factor, multiple sites: Secondary | ICD-10-CM | POA: Insufficient documentation

## 2023-08-05 MED ORDER — CERTOLIZUMAB PEGOL 2 X 200 MG ~~LOC~~ KIT
400.0000 mg | PACK | Freq: Once | SUBCUTANEOUS | Status: AC
  Administered 2023-08-05: 400 mg via SUBCUTANEOUS

## 2023-08-05 NOTE — Progress Notes (Signed)
Subjective:   Patient presents to clinic today to receive monthly dose of Cimzia.  Patient running a fever or have signs/symptoms of infection? No  Patient currently on antibiotics for the treatment of infection? No  Patient have any upcoming invasive procedures/surgeries? No  Objective: CMP     Component Value Date/Time   NA 135 07/03/2023 1457   NA 138 02/20/2016 0000   K 4.2 07/03/2023 1457   CL 99 07/03/2023 1457   CO2 28 07/03/2023 1457   GLUCOSE 106 (H) 07/03/2023 1457   BUN 16 07/03/2023 1457   BUN 13 02/20/2016 0000   CREATININE 0.64 07/03/2023 1457   CALCIUM 9.9 07/03/2023 1457   PROT 6.9 07/03/2023 1457   ALBUMIN 4.5 05/14/2016 1314   AST 29 07/03/2023 1457   ALT 18 07/03/2023 1457   ALKPHOS 59 05/14/2016 1314   BILITOT 0.6 07/03/2023 1457   GFRNONAA 72 11/20/2020 1401   GFRAA 83 11/20/2020 1401    CBC    Component Value Date/Time   WBC 7.1 07/03/2023 1457   RBC 4.09 07/03/2023 1457   HGB 13.1 07/03/2023 1457   HCT 39.1 07/03/2023 1457   PLT 313 07/03/2023 1457   MCV 95.6 07/03/2023 1457   MCH 32.0 07/03/2023 1457   MCHC 33.5 07/03/2023 1457   RDW 12.6 07/03/2023 1457   LYMPHSABS 1,604 11/19/2022 1418   MONOABS 434 05/14/2016 1314   EOSABS 71 07/03/2023 1457   BASOSABS 71 07/03/2023 1457    Baseline Immunosuppressant Therapy Labs TB GOLD    Latest Ref Rng & Units 04/03/2023    2:18 PM  Quantiferon TB Gold  Quantiferon TB Gold Plus NEGATIVE NEGATIVE    Hepatitis Panel    Latest Ref Rng & Units 03/05/2018    1:53 PM  Hepatitis  Hep B Surface Ag NON-REACTI NON-REACTIVE   Hep B IgM NON-REACTI NON-REACTIVE   Hep C Ab NON-REACTI NON-REACTIVE    HIV Lab Results  Component Value Date   HIV NON-REACTIVE 03/05/2018   Immunoglobulins    Latest Ref Rng & Units 03/05/2018    1:53 PM  Immunoglobulin Electrophoresis  IgA  20 - 320 mg/dL 098   IgG 119 - 1,478 mg/dL 295   IgM 50 - 621 mg/dL 308    SPEP    Latest Ref Rng & Units 07/03/2023     2:57 PM  Serum Protein Electrophoresis  Total Protein 6.1 - 8.1 g/dL 6.9    M5HQ Lab Results  Component Value Date   G6PDH 17.9 11/19/2017   TPMT No results found for: "TPMT"   Chest x-ray: 10/2005   Assessment/Plan:   Administrations This Visit     certolizumab pegol (CIMZIA) kit 400 mg     Admin Date 08/05/2023 Action Given Dose 400 mg Route Subcutaneous Documented By Henriette Combs, LPN             Patient tolerated injection well.   Appointment for next injection scheduled for 09/04/2023.  Patient due for labs in April 2025.  Patient is to call and reschedule appointment if running a fever with signs/symptoms of infection, on antibiotics for active infection or has an upcoming invasive procedure.  All questions encouraged and answered.  Instructed patient to call with any further questions or concerns.

## 2023-08-19 ENCOUNTER — Telehealth: Payer: Self-pay | Admitting: Rheumatology

## 2023-08-19 NOTE — Telephone Encounter (Signed)
 Pt called stating her right ankle and foot is a bit swollen and if there is anyway Dr. Corliss Skains can prescribe her some medication to help with that. Pt would like a call back at [phone]

## 2023-08-20 MED ORDER — PREDNISONE 5 MG PO TABS: ORAL_TABLET | ORAL | 0 refills | Status: DC

## 2023-08-20 NOTE — Telephone Encounter (Signed)
 Okay to give a prednisone taper starting at 20 mg and taper by 5 mg every 2 days.  Please advise prednisone can cause elevated blood pressure, elevated blood glucose, weight gain, cataracts, osteoporosis and heart disease.

## 2023-08-20 NOTE — Telephone Encounter (Signed)
 Patient advised prescription for prednisone taper is being sent to the pharmacy. Patient advised prednisone can cause elevated blood pressure, elevated blood glucose, weight gain, cataracts, osteoporosis and heart disease.

## 2023-08-20 NOTE — Telephone Encounter (Signed)
 Reached out to patient and she states she has some swelling in her right foot and ankle. Patient states this has been going on for about 2 weeks. Patient states she has not had any injury. Patient states none of her other joints are bothering her. Patient states when she puts shoes on it bothers her. Patient states she is taking her MTX weekly and she comes for Cimzia monthly. Her last injection was 08/05/2023. Patient is requesting a prescription for prednisone. Please advise.

## 2023-09-01 ENCOUNTER — Telehealth: Payer: Self-pay | Admitting: Rheumatology

## 2023-09-01 NOTE — Telephone Encounter (Signed)
 Patient called to confirm we received a call from Dr. Geoffery Lyons Truitt's office regarding her DEXA scan.  Patient states Dr. Edwyna Ready needed information before she could schedule the appointment.  Patient requested a return call.

## 2023-09-02 ENCOUNTER — Other Ambulatory Visit: Payer: Self-pay | Admitting: *Deleted

## 2023-09-02 DIAGNOSIS — M8589 Other specified disorders of bone density and structure, multiple sites: Secondary | ICD-10-CM

## 2023-09-02 NOTE — Telephone Encounter (Signed)
 I called patient, DEXA order faxed to Dr. Geoffery Lyons Truitt's office, [place], [place]

## 2023-09-04 ENCOUNTER — Ambulatory Visit: Payer: BLUE CROSS/BLUE SHIELD

## 2023-09-05 ENCOUNTER — Telehealth: Payer: Self-pay | Admitting: *Deleted

## 2023-09-05 NOTE — Telephone Encounter (Signed)
 Attempted to contact the patient to reschedule Cimzia injection. Left message for patient to call the office Monday to reschedule.

## 2023-09-08 ENCOUNTER — Ambulatory Visit

## 2023-09-11 ENCOUNTER — Ambulatory Visit: Payer: MEDICARE | Attending: Rheumatology | Admitting: *Deleted

## 2023-09-11 VITALS — BP 153/83 | HR 82

## 2023-09-11 DIAGNOSIS — Z79899 Other long term (current) drug therapy: Secondary | ICD-10-CM | POA: Insufficient documentation

## 2023-09-11 DIAGNOSIS — Z111 Encounter for screening for respiratory tuberculosis: Secondary | ICD-10-CM | POA: Diagnosis present

## 2023-09-11 DIAGNOSIS — M0609 Rheumatoid arthritis without rheumatoid factor, multiple sites: Secondary | ICD-10-CM | POA: Insufficient documentation

## 2023-09-11 DIAGNOSIS — Z9225 Personal history of immunosupression therapy: Secondary | ICD-10-CM | POA: Diagnosis present

## 2023-09-11 MED ORDER — CERTOLIZUMAB PEGOL 2 X 200 MG ~~LOC~~ KIT
400.0000 mg | PACK | Freq: Once | SUBCUTANEOUS | Status: AC
  Administered 2023-09-11: 400 mg via SUBCUTANEOUS

## 2023-09-11 NOTE — Progress Notes (Signed)
 Subjective:   Patient presents to clinic today to receive monthly dose of Cimzia.  Patient running a fever or have signs/symptoms of infection? No  Patient currently on antibiotics for the treatment of infection? No  Patient have any upcoming invasive procedures/surgeries? No  Objective: CMP     Component Value Date/Time   NA 135 07/03/2023 1457   NA 138 02/20/2016 0000   K 4.2 07/03/2023 1457   CL 99 07/03/2023 1457   CO2 28 07/03/2023 1457   GLUCOSE 106 (H) 07/03/2023 1457   BUN 16 07/03/2023 1457   BUN 13 02/20/2016 0000   CREATININE 0.64 07/03/2023 1457   CALCIUM 9.9 07/03/2023 1457   PROT 6.9 07/03/2023 1457   ALBUMIN 4.5 05/14/2016 1314   AST 29 07/03/2023 1457   ALT 18 07/03/2023 1457   ALKPHOS 59 05/14/2016 1314   BILITOT 0.6 07/03/2023 1457   GFRNONAA 72 11/20/2020 1401   GFRAA 83 11/20/2020 1401    CBC    Component Value Date/Time   WBC 7.1 07/03/2023 1457   RBC 4.09 07/03/2023 1457   HGB 13.1 07/03/2023 1457   HCT 39.1 07/03/2023 1457   PLT 313 07/03/2023 1457   MCV 95.6 07/03/2023 1457   MCH 32.0 07/03/2023 1457   MCHC 33.5 07/03/2023 1457   RDW 12.6 07/03/2023 1457   LYMPHSABS 1,604 11/19/2022 1418   MONOABS 434 05/14/2016 1314   EOSABS 71 07/03/2023 1457   BASOSABS 71 07/03/2023 1457    Baseline Immunosuppressant Therapy Labs TB GOLD    Latest Ref Rng & Units 04/03/2023    2:18 PM  Quantiferon TB Gold  Quantiferon TB Gold Plus NEGATIVE NEGATIVE    Hepatitis Panel    Latest Ref Rng & Units 03/05/2018    1:53 PM  Hepatitis  Hep B Surface Ag NON-REACTI NON-REACTIVE   Hep B IgM NON-REACTI NON-REACTIVE   Hep C Ab NON-REACTI NON-REACTIVE    HIV Lab Results  Component Value Date   HIV NON-REACTIVE 03/05/2018   Immunoglobulins    Latest Ref Rng & Units 03/05/2018    1:53 PM  Immunoglobulin Electrophoresis  IgA  20 - 320 mg/dL 161   IgG 096 - 0,454 mg/dL 098   IgM 50 - 119 mg/dL 147    SPEP    Latest Ref Rng & Units 07/03/2023     2:57 PM  Serum Protein Electrophoresis  Total Protein 6.1 - 8.1 g/dL 6.9    W2NF Lab Results  Component Value Date   G6PDH 17.9 11/19/2017   TPMT No results found for: "TPMT"   Chest x-ray: 10/2005   Assessment/Plan:   Administrations This Visit     certolizumab pegol (CIMZIA) kit 400 mg     Admin Date 09/11/2023 Action Given Dose 400 mg Route Subcutaneous Documented By Henriette Combs, LPN            Patient tolerated injection well.   Appointment for next injection scheduled for 10/09/2023.  Patient due for labs in April 2025.  Patient is to call and reschedule appointment if running a fever with signs/symptoms of infection, on antibiotics for active infection or has an upcoming invasive procedure.  All questions encouraged and answered.  Instructed patient to call with any further questions or concerns.

## 2023-09-19 ENCOUNTER — Other Ambulatory Visit: Payer: Self-pay | Admitting: Physician Assistant

## 2023-09-19 NOTE — Telephone Encounter (Signed)
 Last Fill: 06/30/2023  Labs: 07/03/2023 CBC and CMP are normal.   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

## 2023-10-08 ENCOUNTER — Other Ambulatory Visit: Payer: Self-pay | Admitting: *Deleted

## 2023-10-08 MED ORDER — PREDNISONE 5 MG PO TABS: ORAL_TABLET | ORAL | 0 refills | Status: DC

## 2023-10-08 NOTE — Telephone Encounter (Signed)
 Okay to send a prednisone  taper starting at 20 mg and taper by 5 mg every 2 days.  If there is a cancellation tomorrow then we can work her in earlier.

## 2023-10-08 NOTE — Addendum Note (Signed)
 Addended by: Adrianne Horn on: 10/08/2023 02:06 PM   Modules accepted: Orders

## 2023-10-08 NOTE — Telephone Encounter (Signed)
 Patient advised okay to send a prednisone  taper starting at 20 mg and taper by 5 mg every 2 days. If there is a cancellation tomorrow then we can work her in earlier.

## 2023-10-08 NOTE — Telephone Encounter (Signed)
 Patient contacted the office stating she is having swelling and pain in her right knee done to her ankle. Patient was inquiring about an injection in her knee. Patient is scheduled for a Cimzia  injection tomorrow. Patient advised Dr. Alvira Josephs and Carolynne Citron have a full schedule tomorrow afternoon and at this time I could not schedule her for an an injection. Patient states she would like to know if she can have a prescription for prednisone . Please advise.

## 2023-10-09 ENCOUNTER — Ambulatory Visit: Payer: MEDICARE | Attending: Rheumatology | Admitting: *Deleted

## 2023-10-09 VITALS — BP 139/88 | HR 76

## 2023-10-09 DIAGNOSIS — M0609 Rheumatoid arthritis without rheumatoid factor, multiple sites: Secondary | ICD-10-CM | POA: Diagnosis present

## 2023-10-09 DIAGNOSIS — Z79899 Other long term (current) drug therapy: Secondary | ICD-10-CM | POA: Diagnosis present

## 2023-10-09 MED ORDER — CERTOLIZUMAB PEGOL 2 X 200 MG ~~LOC~~ KIT
400.0000 mg | PACK | Freq: Once | SUBCUTANEOUS | Status: AC
  Administered 2023-10-09: 400 mg via SUBCUTANEOUS

## 2023-10-09 NOTE — Progress Notes (Signed)
 Subjective:   Patient presents to clinic today to receive monthly dose of Cimzia .  Patient running a fever or have signs/symptoms of infection? No  Patient currently on antibiotics for the treatment of infection? No  Patient have any upcoming invasive procedures/surgeries? No  Objective: CMP     Component Value Date/Time   NA 135 07/03/2023 1457   NA 138 02/20/2016 0000   K 4.2 07/03/2023 1457   CL 99 07/03/2023 1457   CO2 28 07/03/2023 1457   GLUCOSE 106 (H) 07/03/2023 1457   BUN 16 07/03/2023 1457   BUN 13 02/20/2016 0000   CREATININE 0.64 07/03/2023 1457   CALCIUM 9.9 07/03/2023 1457   PROT 6.9 07/03/2023 1457   ALBUMIN 4.5 05/14/2016 1314   AST 29 07/03/2023 1457   ALT 18 07/03/2023 1457   ALKPHOS 59 05/14/2016 1314   BILITOT 0.6 07/03/2023 1457   GFRNONAA 72 11/20/2020 1401   GFRAA 83 11/20/2020 1401    CBC    Component Value Date/Time   WBC 7.1 07/03/2023 1457   RBC 4.09 07/03/2023 1457   HGB 13.1 07/03/2023 1457   HCT 39.1 07/03/2023 1457   PLT 313 07/03/2023 1457   MCV 95.6 07/03/2023 1457   MCH 32.0 07/03/2023 1457   MCHC 33.5 07/03/2023 1457   RDW 12.6 07/03/2023 1457   LYMPHSABS 1,604 11/19/2022 1418   MONOABS 434 05/14/2016 1314   EOSABS 71 07/03/2023 1457   BASOSABS 71 07/03/2023 1457    Baseline Immunosuppressant Therapy Labs TB GOLD    Latest Ref Rng & Units 04/03/2023    2:18 PM  Quantiferon TB Gold  Quantiferon TB Gold Plus NEGATIVE NEGATIVE    Hepatitis Panel    Latest Ref Rng & Units 03/05/2018    1:53 PM  Hepatitis  Hep B Surface Ag NON-REACTI NON-REACTIVE   Hep B IgM NON-REACTI NON-REACTIVE   Hep C Ab NON-REACTI NON-REACTIVE    HIV Lab Results  Component Value Date   HIV NON-REACTIVE 03/05/2018   Immunoglobulins    Latest Ref Rng & Units 03/05/2018    1:53 PM  Immunoglobulin Electrophoresis  IgA  20 - 320 mg/dL 409   IgG 811 - 9,147 mg/dL 829   IgM 50 - 562 mg/dL 130    SPEP    Latest Ref Rng & Units 07/03/2023     2:57 PM  Serum Protein Electrophoresis  Total Protein 6.1 - 8.1 g/dL 6.9    Q6VH Lab Results  Component Value Date   G6PDH 17.9 11/19/2017   TPMT No results found for: "TPMT"   Chest x-ray: 10/2005   Assessment/Plan:   Administrations This Visit     certolizumab pegol  (CIMZIA ) kit 400 mg     Admin Date 10/09/2023 Action Given Dose 400 mg Route Subcutaneous Documented By Adrianne Horn, LPN             Patient tolerated injection well.   Appointment for next injection scheduled for 11/06/2023.  Patient due for labs and were drawn while in office today.  Patient is to call and reschedule appointment if running a fever with signs/symptoms of infection, on antibiotics for active infection or has an upcoming invasive procedure.  All questions encouraged and answered.  Instructed patient to call with any further questions or concerns.

## 2023-10-10 LAB — CBC WITH DIFFERENTIAL/PLATELET
Absolute Lymphocytes: 2162 {cells}/uL (ref 850–3900)
Absolute Monocytes: 840 {cells}/uL (ref 200–950)
Basophils Absolute: 81 {cells}/uL (ref 0–200)
Basophils Relative: 0.7 %
Eosinophils Absolute: 104 {cells}/uL (ref 15–500)
Eosinophils Relative: 0.9 %
HCT: 37.6 % (ref 35.0–45.0)
Hemoglobin: 12.9 g/dL (ref 11.7–15.5)
MCH: 32.4 pg (ref 27.0–33.0)
MCHC: 34.3 g/dL (ref 32.0–36.0)
MCV: 94.5 fL (ref 80.0–100.0)
MPV: 8.7 fL (ref 7.5–12.5)
Monocytes Relative: 7.3 %
Neutro Abs: 8315 {cells}/uL — ABNORMAL HIGH (ref 1500–7800)
Neutrophils Relative %: 72.3 %
Platelets: 285 10*3/uL (ref 140–400)
RBC: 3.98 10*6/uL (ref 3.80–5.10)
RDW: 12.7 % (ref 11.0–15.0)
Total Lymphocyte: 18.8 %
WBC: 11.5 10*3/uL — ABNORMAL HIGH (ref 3.8–10.8)

## 2023-10-10 LAB — COMPREHENSIVE METABOLIC PANEL WITH GFR
AG Ratio: 2 (calc) (ref 1.0–2.5)
ALT: 15 U/L (ref 6–29)
AST: 25 U/L (ref 10–35)
Albumin: 4.4 g/dL (ref 3.6–5.1)
Alkaline phosphatase (APISO): 113 U/L (ref 37–153)
BUN: 21 mg/dL (ref 7–25)
CO2: 25 mmol/L (ref 20–32)
Calcium: 9.4 mg/dL (ref 8.6–10.4)
Chloride: 99 mmol/L (ref 98–110)
Creat: 0.74 mg/dL (ref 0.60–0.95)
Globulin: 2.2 g/dL (ref 1.9–3.7)
Glucose, Bld: 95 mg/dL (ref 65–99)
Potassium: 4.3 mmol/L (ref 3.5–5.3)
Sodium: 134 mmol/L — ABNORMAL LOW (ref 135–146)
Total Bilirubin: 0.4 mg/dL (ref 0.2–1.2)
Total Protein: 6.6 g/dL (ref 6.1–8.1)
eGFR: 80 mL/min/{1.73_m2} (ref >=60)

## 2023-10-10 NOTE — Progress Notes (Signed)
 White cell count is mildly elevated.  CMP is stable.  Will continue to monitor labs.

## 2023-10-26 NOTE — Progress Notes (Signed)
 Office Visit Note  Patient: April Shepard             Date of Birth: 11/16/1940           MRN: [id number]             PCP: Aloha Jakes, MD Referring: Aloha Jakes, MD Visit Date: 11/06/2023 Occupation: @GUAROCC @  Subjective:  Right knee pain and swelling   History of Present Illness: April Shepard is a 83 y.o. female with history of seropositive rheumatoid arthritis and osteoarthritis.  Patient remains on  Cimzia  400 mg sq injection every 28 days, methotrexate  2.5 mg, 5 tablets every 7 days, and folic acid  1 mg, 1 tablet p.o. daily.  Patient is due for cimzia  today.  Patient needs to tolerate the current combination of medications.  She requested a refill of folic acid  to be sent to the pharmacy today.  Patient states that she has been experiencing recurrent flares in the right knee. A prednisone  taper was sent to the pharmacy on 08/20/2023 and again on 10/08/2023.  Patient states that she had temporary relief with the prednisone  taper but then her symptoms returned after the taper was completed.  Patient states for the past 2 weeks she is also been experiencing discomfort in her right shoulder.  She has noticed limited range of motion but denies any nocturnal pain in her right shoulder.  She denies any other joint pain or joint swelling at this time.  She denies any new medical conditions.  She denies any recent or recurrent infections.  Patient states that she is scheduled for a bone density on 11/25/2023.   Activities of Daily Living:  Patient denies any morning stiffness  Patient Denies nocturnal pain.  Difficulty dressing/grooming: Denies Difficulty climbing stairs: Denies Difficulty getting out of chair: Denies Difficulty using hands for taps, buttons, cutlery, and/or writing: Denies  Review of Systems  Constitutional:  Positive for fatigue.  HENT:  Negative for mouth sores and mouth dryness.   Eyes:  Negative for dryness.  Respiratory:  Negative for  shortness of breath.   Cardiovascular:  Negative for chest pain and palpitations.  Gastrointestinal:  Negative for blood in stool, constipation and diarrhea.  Endocrine: Negative for increased urination.  Genitourinary:  Positive for involuntary urination.  Musculoskeletal:  Positive for joint pain, joint pain and joint swelling. Negative for gait problem, myalgias, muscle weakness, morning stiffness, muscle tenderness and myalgias.  Skin:  Negative for color change, rash, hair loss and sensitivity to sunlight.  Allergic/Immunologic: Negative for susceptible to infections.  Neurological:  Negative for dizziness and headaches.  Hematological:  Negative for swollen glands.  Psychiatric/Behavioral:  Negative for depressed mood and sleep disturbance. The patient is not nervous/anxious.     PMFS History:  Patient Active Problem List   Diagnosis Date Noted  . Dyslipidemia 07/23/2018  . 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 High risk medication use 10/22/2016  . Trigger finger, right ring finger 10/22/2016  . Osteopenia 05/13/2016  . Osteoarthritis of both hands 05/13/2016  . RA (rheumatoid arthritis) (HCC) 05/13/2016    Past Medical History:  Diagnosis Date  . Osteoarthritis of both hands 05/13/2016  . Osteopenia 05/13/2016  . RA (rheumatoid arthritis) (HCC) 05/13/2016    Family History  Problem Relation Age of Onset  . Breast cancer Mother   . Heart attack Father   . Melanoma Brother    Past Surgical History:  Procedure Laterality Date  . CHOLECYSTECTOMY  Social History   Social History Narrative  . Not on file   Immunization History  Administered Date(s) Administered  . Influenza, High Dose Seasonal PF 03/27/2018  . Moderna Sars-Covid-2 Vaccination 08/17/2019, 09/14/2019, 05/08/2020     Objective: Vital Signs: BP (!) 143/69 (BP Location: Left Arm, Patient Position: Sitting, Cuff Size: Normal)   Pulse 73   Resp 16   Ht 5\' 2"  (1.575 m)    Wt 131 lb (59.4 kg)   BMI 23.96 kg/m    Physical Exam Vitals and nursing note reviewed.  Constitutional:      Appearance: She is well-developed.  HENT:     Head: Normocephalic and atraumatic.  Eyes:     Conjunctiva/sclera: Conjunctivae normal.  Cardiovascular:     Rate and Rhythm: Normal rate and regular rhythm.     Heart sounds: Normal heart sounds.  Pulmonary:     Effort: Pulmonary effort is normal.     Breath sounds: Normal breath sounds.  Abdominal:     General: Bowel sounds are normal.     Palpations: Abdomen is soft.  Musculoskeletal:     Cervical back: Normal range of motion.  Lymphadenopathy:     Cervical: No cervical adenopathy.  Skin:    General: Skin is warm and dry.     Capillary Refill: Capillary refill takes less than 2 seconds.  Neurological:     Mental Status: She is alert and oriented to person, place, and time.  Psychiatric:        Behavior: Behavior normal.      Musculoskeletal Exam: C-spine has good range of motion.  Right shoulder has limited abduction to about 110 degrees.  Left shoulder has full range of motion.  elbow joints, wrist joints, MCPs, PIPs, DIPs have good range of motion with no synovitis.  CMC, PIP, DIP thickening consistent with osteoarthritis of both hands.  Right fourth PIP flexion contracture.  Synovial thickening of MCP joints but no active synovitis noted.  Hip joints have good range of motion with no groin pain.  Synovial thickening and a small effusion noted in the right knee.  Left knee joint has good range of motion with no warmth or effusion.  Ankle joints have good range of motion with some synovial thickening in the right ankle.  CDAI Exam: CDAI Score: -- Patient Global: --; Provider Global: -- Swollen: 1 ; Tender: 2  Joint Exam 11/06/2023      Right  Left  Glenohumeral   Tender     Knee  Swollen Tender        Investigation: No additional findings.  Imaging: No results found.  Recent Labs: Lab Results   Component Value Date   WBC 11.5 (H) 10/09/2023   HGB 12.9 10/09/2023   PLT 285 10/09/2023   NA 134 (L) 10/09/2023   K 4.3 10/09/2023   CL 99 10/09/2023   CO2 25 10/09/2023   GLUCOSE 95 10/09/2023   BUN 21 10/09/2023   CREATININE 0.74 10/09/2023   BILITOT 0.4 10/09/2023   ALKPHOS 59 05/14/2016   AST 25 10/09/2023   ALT 15 10/09/2023   PROT 6.6 10/09/2023   ALBUMIN 4.5 05/14/2016   CALCIUM 9.4 10/09/2023   GFRAA 83 11/20/2020   QFTBGOLDPLUS NEGATIVE 04/03/2023    Speciality Comments: no baseline biologic labs  Procedures:  Large Joint Inj: R knee on 11/06/2023 11:31 AM Indications: pain Details: 27 G 1.5 in needle, medial approach  Arthrogram: No  Medications: 1.5 mL lidocaine  1 %; 40 mg triamcinolone   acetonide 40 MG/ML Aspirate: 0 mL Outcome: tolerated well, no immediate complications Procedure, treatment alternatives, risks and benefits explained, specific risks discussed. Consent was given by the patient. Immediately prior to procedure a time out was called to verify the correct patient, procedure, equipment, support staff and site/side marked as required. Patient was prepped and draped in the usual sterile fashion.    Large Joint Inj: R glenohumeral on 11/06/2023 11:43 AM Indications: pain Details: 27 G 1.5 in needle, posterior approach  Arthrogram: No  Medications: 1 mL lidocaine  1 %; 40 mg triamcinolone  acetonide 40 MG/ML Aspirate: 0 mL Outcome: tolerated well, no immediate complications Procedure, treatment alternatives, risks and benefits explained, specific risks discussed. Consent was given by the patient. Immediately prior to procedure a time out was called to verify the correct patient, procedure, equipment, support staff and site/side marked as required. Patient was prepped and draped in the usual sterile fashion.    Allergies: Patient has no known allergies.    Assessment / Plan:     Visit Diagnoses: Rheumatoid arthritis of multiple sites with negative  rheumatoid factor (HCC) - Patient presents today for further evaluation of increased pain in the right shoulder x 2 weeks and recurrent pain and inflammation in the right knee.  Synovial thickening and a small effusion was noted in the right knee.  Limited abduction of the right shoulder to about 110 degrees was noted.  Patient was given a prednisone  taper on 08/20/2023 and another taper on 10/08/2023.  She had temporary relief while taking prednisone  but her symptoms in the right knee have recurred.  Patient has remained on Cimzia  400 mg 7 days injections every 28 days, methotrexate  5 tablets by mouth once weekly, and folic acid  1 mg daily.  She has not had any recent gaps in therapy.  Her dose of Cimzia  was administered today while in the clinic.  X-rays of the right shoulder and right knee were obtained today for further evaluation.  The patient requested a right knee joint and right shoulder joint cortisone injection today.  She tolerated the procedures well.  Procedure notes was completed above.  Aftercare was discussed.  No medication changes will be made at this time.  She was advised to notify us  if her symptoms recur.  She will follow-up in the office in 5 months or sooner if needed.  Plan: Lipid panel  High risk medication use - Cimzia  400 mg sq injection every 28 days, methotrexate  2.5 mg, 5 tablets every 7 days, and folic acid  1 mg, 1 tablet p.o. daily. CBC and CMP updated on 10/09/23.  Her next lab work will be due in July and every 3 months to monitor for drug toxicity. TB gold negative on 04/03/23.  Future order for lipid panel placed today to be drawn with her next lab work. No recent or recurrent infections. Discussed the importance of holding cimzia  and methotrexate  if she develops signs or symptoms of an infection and to resume once the infection has completely cleared.   - Plan: Lipid panel  Acute pain of right shoulder -Patient presents today with discomfort in the right shoulder x 2  weeks.  She has not had any recent injury or fall prior to the onset of symptoms.  She has not been experiencing any nocturnal pain.  Her symptoms are exacerbated by range of motion exercises.  She has limited abduction to about 110 degrees.  Different treatment options were discussed today.  Patient was given a handout of exercises to perform.  Patient requested a right shoulder joint cortisone injection today.  X-rays of the right shoulder updated today for further evaluation. After informed consent the right glenohumeral joint was injected with cortisone.  She tolerated procedure well.  Procedure note was completed above.  Aftercare was discussed.  She is advised to notify us  if her symptoms persist or worsen.  Plan: XR Shoulder Right  Chronic pain of right knee -She has been experiencing recurrent flares in the right knee.  She has taken 2 prednisone  tapers which provided temporary relief but her symptoms continue to recur.    X-rays of the right knee updated today. On examination she has an obvious thickening and a small effusion in the right knee.  Patient requested a right knee joint cortisone injection today.  She tolerated procedure well.  Procedure note was completed above.  Aftercare was discussed.  Plan: XR KNEE 3 VIEW RIGHT  Osteopenia of multiple sites: DEXA previously ordered by gynecologist.  Scheduled for DEXA on 11/25/23.   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  No synovitis was noted on examination today.  Discussed the importance of joint protection and muscle strengthening.  Primary osteoarthritis of both feet: She is not experiencing any increased discomfort in her feet at this time.  Synovial thickening of the right ankle was noted.  History of pelvic fracture - 01/08/2023 diagnosed with pelvic fracture. Scheduled for DEXA on 11/25/2023.  Closed  nondisplaced fracture of fifth metatarsal bone of left foot, sequela: Scheduled for DEXA on 11/25/2023.  Dyslipidemia -Future order for lipid panel placed today.  Plan: Lipid panel  Other medical conditions are listed as follows:  History of Clostridium difficile infection    Orders: Orders Placed This Encounter  Procedures  . Large Joint Inj: R knee  . Large Joint Inj: R glenohumeral  . XR KNEE 3 VIEW RIGHT  . XR Shoulder Right  . Lipid panel   Meds ordered this encounter  Medications  . folic acid  (FOLVITE ) 1 MG tablet    Sig: Take 2 tablets (2 mg total) by mouth daily.    Dispense:  180 tablet    Refill:  0  . certolizumab pegol  (CIMZIA ) kit 400 mg     Follow-Up Instructions: Return in about 5 months (around 04/07/2024) for Rheumatoid arthritis, Osteoarthritis.   Romayne Clubs, PA-C  Note - This record has been created using Dragon software.  Chart creation errors have been sought, but may not always  have been located. Such creation errors do not reflect on  the standard of medical care.

## 2023-11-04 ENCOUNTER — Ambulatory Visit: Payer: BLUE CROSS/BLUE SHIELD | Admitting: Physician Assistant

## 2023-11-06 ENCOUNTER — Ambulatory Visit: Payer: Self-pay | Admitting: Physician Assistant

## 2023-11-06 ENCOUNTER — Ambulatory Visit: Payer: MEDICARE | Attending: Physician Assistant | Admitting: Physician Assistant

## 2023-11-06 ENCOUNTER — Ambulatory Visit (INDEPENDENT_AMBULATORY_CARE_PROVIDER_SITE_OTHER): Payer: MEDICARE

## 2023-11-06 ENCOUNTER — Encounter: Payer: Self-pay | Admitting: Physician Assistant

## 2023-11-06 VITALS — BP 143/69 | HR 73 | Resp 16 | Ht 62.0 in | Wt 131.0 lb

## 2023-11-06 DIAGNOSIS — M0609 Rheumatoid arthritis without rheumatoid factor, multiple sites: Secondary | ICD-10-CM | POA: Insufficient documentation

## 2023-11-06 DIAGNOSIS — M19072 Primary osteoarthritis, left ankle and foot: Secondary | ICD-10-CM | POA: Insufficient documentation

## 2023-11-06 DIAGNOSIS — Z8619 Personal history of other infectious and parasitic diseases: Secondary | ICD-10-CM | POA: Insufficient documentation

## 2023-11-06 DIAGNOSIS — Z8781 Personal history of (healed) traumatic fracture: Secondary | ICD-10-CM | POA: Diagnosis present

## 2023-11-06 DIAGNOSIS — M19071 Primary osteoarthritis, right ankle and foot: Secondary | ICD-10-CM | POA: Diagnosis present

## 2023-11-06 DIAGNOSIS — Z79899 Other long term (current) drug therapy: Secondary | ICD-10-CM | POA: Diagnosis present

## 2023-11-06 DIAGNOSIS — Z9181 History of falling: Secondary | ICD-10-CM | POA: Diagnosis present

## 2023-11-06 DIAGNOSIS — M25511 Pain in right shoulder: Secondary | ICD-10-CM

## 2023-11-06 DIAGNOSIS — T372X5A Adverse effect of antimalarials and drugs acting on other blood protozoa, initial encounter: Secondary | ICD-10-CM | POA: Diagnosis present

## 2023-11-06 DIAGNOSIS — E785 Hyperlipidemia, unspecified: Secondary | ICD-10-CM | POA: Diagnosis present

## 2023-11-06 DIAGNOSIS — M25561 Pain in right knee: Secondary | ICD-10-CM | POA: Diagnosis present

## 2023-11-06 DIAGNOSIS — M8589 Other specified disorders of bone density and structure, multiple sites: Secondary | ICD-10-CM | POA: Insufficient documentation

## 2023-11-06 DIAGNOSIS — G8929 Other chronic pain: Secondary | ICD-10-CM

## 2023-11-06 DIAGNOSIS — S92355S Nondisplaced fracture of fifth metatarsal bone, left foot, sequela: Secondary | ICD-10-CM | POA: Diagnosis present

## 2023-11-06 DIAGNOSIS — H35389 Toxic maculopathy, unspecified eye: Secondary | ICD-10-CM | POA: Diagnosis not present

## 2023-11-06 DIAGNOSIS — M19042 Primary osteoarthritis, left hand: Secondary | ICD-10-CM | POA: Diagnosis present

## 2023-11-06 DIAGNOSIS — M19041 Primary osteoarthritis, right hand: Secondary | ICD-10-CM | POA: Insufficient documentation

## 2023-11-06 MED ORDER — CERTOLIZUMAB PEGOL 2 X 200 MG ~~LOC~~ KIT
400.0000 mg | PACK | Freq: Once | SUBCUTANEOUS | Status: AC
  Administered 2023-11-06: 400 mg via SUBCUTANEOUS

## 2023-11-06 MED ORDER — FOLIC ACID 1 MG PO TABS: 2.0000 mg | ORAL_TABLET | Freq: Every day | ORAL | 0 refills | Status: AC

## 2023-11-06 MED ORDER — TRIAMCINOLONE ACETONIDE 40 MG/ML IJ SUSP
40.0000 mg | INTRAMUSCULAR | Status: AC | PRN
  Administered 2023-11-06: 40 mg via INTRA_ARTICULAR

## 2023-11-06 MED ORDER — LIDOCAINE HCL 1 % IJ SOLN
1.0000 mL | INTRAMUSCULAR | Status: AC | PRN
  Administered 2023-11-06: 1 mL

## 2023-11-06 MED ORDER — LIDOCAINE HCL 1 % IJ SOLN
1.5000 mL | INTRAMUSCULAR | Status: AC | PRN
  Administered 2023-11-06: 1.5 mL

## 2023-11-06 NOTE — Progress Notes (Signed)
 X-rays of the right shoulder reveal low riding humerus--suspicious for rotator cuff tear--please notify the patient-recommend evaluation by orthopedics if the injection does not improve her symptoms.

## 2023-11-06 NOTE — Progress Notes (Signed)
 X-rays of the right knee consistent with moderate to severe osteoarthritis.  Please notify the patient.

## 2023-11-06 NOTE — Progress Notes (Signed)
 Subjective:   Patient presents to clinic today to receive monthly dose of Cimzia .  Patient running a fever or have signs/symptoms of infection? No  Patient currently on antibiotics for the treatment of infection? No  Patient have any upcoming invasive procedures/surgeries? No  Objective: CMP     Component Value Date/Time   NA 134 (L) 10/09/2023 1417   NA 138 02/20/2016 0000   K 4.3 10/09/2023 1417   CL 99 10/09/2023 1417   CO2 25 10/09/2023 1417   GLUCOSE 95 10/09/2023 1417   BUN 21 10/09/2023 1417   BUN 13 02/20/2016 0000   CREATININE 0.74 10/09/2023 1417   CALCIUM 9.4 10/09/2023 1417   PROT 6.6 10/09/2023 1417   ALBUMIN 4.5 05/14/2016 1314   AST 25 10/09/2023 1417   ALT 15 10/09/2023 1417   ALKPHOS 59 05/14/2016 1314   BILITOT 0.4 10/09/2023 1417   GFRNONAA 72 11/20/2020 1401   GFRAA 83 11/20/2020 1401    CBC    Component Value Date/Time   WBC 11.5 (H) 10/09/2023 1417   RBC 3.98 10/09/2023 1417   HGB 12.9 10/09/2023 1417   HCT 37.6 10/09/2023 1417   PLT 285 10/09/2023 1417   MCV 94.5 10/09/2023 1417   MCH 32.4 10/09/2023 1417   MCHC 34.3 10/09/2023 1417   RDW 12.7 10/09/2023 1417   LYMPHSABS 1,604 11/19/2022 1418   MONOABS 434 05/14/2016 1314   EOSABS 104 10/09/2023 1417   BASOSABS 81 10/09/2023 1417    Baseline Immunosuppressant Therapy Labs TB GOLD    Latest Ref Rng & Units 04/03/2023    2:18 PM  Quantiferon TB Gold  Quantiferon TB Gold Plus NEGATIVE NEGATIVE    Hepatitis Panel    Latest Ref Rng & Units 03/05/2018    1:53 PM  Hepatitis  Hep B Surface Ag NON-REACTI NON-REACTIVE   Hep B IgM NON-REACTI NON-REACTIVE   Hep C Ab NON-REACTI NON-REACTIVE    HIV Lab Results  Component Value Date   HIV NON-REACTIVE 03/05/2018   Immunoglobulins    Latest Ref Rng & Units 03/05/2018    1:53 PM  Immunoglobulin Electrophoresis  IgA  20 - 320 mg/dL 161   IgG 096 - 0,454 mg/dL 098   IgM 50 - 119 mg/dL 147    SPEP    Latest Ref Rng & Units 10/09/2023     2:17 PM  Serum Protein Electrophoresis  Total Protein 6.1 - 8.1 g/dL 6.6    W2NF Lab Results  Component Value Date   G6PDH 17.9 11/19/2017   TPMT No results found for: "TPMT"   Chest x-ray: 10/18/2005 Normal  Assessment/Plan:   Administrations This Visit     certolizumab pegol  (CIMZIA ) kit 400 mg     Admin Date 11/06/2023 Action Given Dose 400 mg Route Subcutaneous Documented By Adrianne Horn, LPN             Patient tolerated injection well.   Appointment for next injection scheduled for 12/04/2023.  Patient due for labs in July 2025.  Patient is to call and reschedule appointment if running a fever with signs/symptoms of infection, on antibiotics for active infection or has an upcoming invasive procedure.  All questions encouraged and answered.  Instructed patient to call with any further questions or concerns.

## 2023-11-06 NOTE — Patient Instructions (Signed)
 Knee Exercises Ask your health care provider which exercises are safe for you. Do exercises exactly as told by your health care provider and adjust them as directed. It is normal to feel mild stretching, pulling, tightness, or discomfort as you do these exercises. Stop right away if you feel sudden pain or your pain gets worse. Do not begin these exercises until told by your health care provider. Stretching and range-of-motion exercises These exercises warm up your muscles and joints and improve the movement and flexibility of your knee. These exercises also help to relieve pain and swelling. Knee extension, prone  Lie on your abdomen (prone position) on a bed. Place your left / right knee just beyond the edge of the surface so your knee is not on the bed. You can put a towel under your left / right thigh just above your kneecap for comfort. Relax your leg muscles and allow gravity to straighten your knee (extension). You should feel a stretch behind your left / right knee. Hold this position for __________ seconds. Scoot up so your knee is supported between repetitions. Repeat __________ times. Complete this exercise __________ times a day. Knee flexion, active  Lie on your back with both legs straight. If this causes back discomfort, bend your left / right knee so your foot is flat on the floor. Slowly slide your left / right heel back toward your buttocks. Stop when you feel a gentle stretch in the front of your knee or thigh (flexion). Hold this position for __________ seconds. Slowly slide your left / right heel back to the starting position. Repeat __________ times. Complete this exercise __________ times a day. Quadriceps stretch, prone  Lie on your abdomen on a firm surface, such as a bed or padded floor. Bend your left / right knee and hold your ankle. If you cannot reach your ankle or pant leg, loop a belt around your foot and grab the belt instead. Gently pull your heel toward your  buttocks. Your knee should not slide out to the side. You should feel a stretch in the front of your thigh and knee (quadriceps). Hold this position for __________ seconds. Repeat __________ times. Complete this exercise __________ times a day. Hamstring, supine  Lie on your back (supine position). Loop a belt or towel over the ball of your left / right foot. The ball of your foot is on the walking surface, right under your toes. Straighten your left / right knee and slowly pull on the belt to raise your leg until you feel a gentle stretch behind your knee (hamstring). Do not let your knee bend while you do this. Keep your other leg flat on the floor. Hold this position for __________ seconds. Repeat __________ times. Complete this exercise __________ times a day. Strengthening exercises These exercises build strength and endurance in your knee. Endurance is the ability to use your muscles for a long time, even after they get tired. Quadriceps, isometric This exercise strengthens the muscles in front of your thigh (quadriceps) without moving your knee joint (isometric). Lie on your back with your left / right leg extended and your other knee bent. Put a rolled towel or small pillow under your knee if told by your health care provider. Slowly tense the muscles in the front of your left / right thigh. You should see your kneecap slide up toward your hip or see increased dimpling just above the knee. This motion will push the back of the knee toward the floor.  For __________ seconds, hold the muscle as tight as you can without increasing your pain. Relax the muscles slowly and completely. Repeat __________ times. Complete this exercise __________ times a day. Straight leg raises This exercise strengthens the muscles in front of your thigh (quadriceps) and the muscles that move your hips (hip flexors). Lie on your back with your left / right leg extended and your other knee bent. Tense the  muscles in the front of your left / right thigh. You should see your kneecap slide up or see increased dimpling just above the knee. Your thigh may even shake a bit. Keep these muscles tight as you raise your leg 4-6 inches (10-15 cm) off the floor. Do not let your knee bend. Hold this position for __________ seconds. Keep these muscles tense as you lower your leg. Relax your muscles slowly and completely after each repetition. Repeat __________ times. Complete this exercise __________ times a day. Hamstring, isometric  Lie on your back on a firm surface. Bend your left / right knee about __________ degrees. Dig your left / right heel into the surface as if you are trying to pull it toward your buttocks. Tighten the muscles in the back of your thighs (hamstring) to "dig" as hard as you can without increasing any pain. Hold this position for __________ seconds. Release the tension gradually and allow your muscles to relax completely for __________ seconds after each repetition. Repeat __________ times. Complete this exercise __________ times a day. Hamstring curls If told by your health care provider, do this exercise while wearing ankle weights. Begin with __________lb / kg weights. Then increase the weight by 1 lb (0.5 kg) increments. Do not wear ankle weights that are more than __________lb / kg. Lie on your abdomen with your legs straight. Bend your left / right knee as far as you can without feeling pain. Keep your hips flat against the floor. Hold this position for __________ seconds. Slowly lower your leg to the starting position. Repeat __________ times. Complete this exercise __________ times a day. Squats This exercise strengthens the muscles in front of your thigh and knee (quadriceps). Stand in front of a table, with your feet and knees pointing straight ahead. You may rest your hands on the table for balance but not for support. Slowly bend your knees and lower your hips like you  are going to sit in a chair. Keep your weight over your heels, not over your toes. Keep your lower legs upright so they are parallel with the table legs. Do not let your hips go lower than your knees. Do not bend lower than told by your health care provider. If your knee pain increases, do not bend as low. Hold the squat position for __________ seconds. Slowly push with your legs to return to standing. Do not use your hands to pull yourself to standing. Repeat __________ times. Complete this exercise __________ times a day. Wall slides This exercise strengthens the muscles in front of your thigh and knee (quadriceps). Lean your back against a smooth wall or door, and walk your feet out 18-24 inches (46-61 cm) from it. Place your feet hip-width apart. Slowly slide down the wall or door until your knees bend __________ degrees. Keep your knees over your heels, not over your toes. Keep your knees in line with your hips. Hold this position for __________ seconds. Repeat __________ times. Complete this exercise __________ times a day. Straight leg raises, side-lying This exercise strengthens the muscles that rotate  the leg at the hip and move it away from your body (hip abductors). Lie on your side with your left / right leg in the top position. Lie so your head, shoulder, knee, and hip line up. You may bend your bottom knee to help you keep your balance. Roll your hips slightly forward so your hips are stacked directly over each other and your left / right knee is facing forward. Leading with your heel, lift your top leg 4-6 inches (10-15 cm). You should feel the muscles in your outer hip lifting. Do not let your foot drift forward. Do not let your knee roll toward the ceiling. Hold this position for __________ seconds. Slowly return your leg to the starting position. Let your muscles relax completely after each repetition. Repeat __________ times. Complete this exercise __________ times a  day. Straight leg raises, prone This exercise stretches the muscles that move your hips away from the front of the pelvis (hip extensors). Lie on your abdomen on a firm surface. You can put a pillow under your hips if that is more comfortable. Tense the muscles in your buttocks and lift your left / right leg about 4-6 inches (10-15 cm). Keep your knee straight as you lift your leg. Hold this position for __________ seconds. Slowly lower your leg to the starting position. Let your leg relax completely after each repetition. Repeat __________ times. Complete this exercise __________ times a day. This information is not intended to replace advice given to you by your health care provider. Make sure you discuss any questions you have with your health care provider. Document Revised: 02/13/2021 Document Reviewed: 02/13/2021 [employer] Patient Education  2024 [employer]Shoulder Exercises Ask your health care provider which exercises are safe for you. Do exercises exactly as told by your health care provider and adjust them as directed. It is normal to feel mild stretching, pulling, tightness, or discomfort as you do these exercises. Stop right away if you feel sudden pain or your pain gets worse. Do not begin these exercises until told by your health care provider. Stretching exercises External rotation and abduction This exercise is sometimes called corner stretch. The exercise rotates your arm outward (external rotation) and moves your arm out from your body (abduction). Stand in a doorway with one of your feet slightly in front of the other. This is called a staggered stance. If you cannot reach your forearms to the door frame, stand facing a corner of a room. Choose one of the following positions as told by your health care provider: Place your hands and forearms on the door frame above your head. Place your hands and forearms on the door frame at the height of your head. Place your hands on the  door frame at the height of your elbows. Slowly move your weight onto your front foot until you feel a stretch across your chest and in the front of your shoulders. Keep your head and chest upright and keep your abdominal muscles tight. Hold for __________ seconds. To release the stretch, shift your weight to your back foot. Repeat __________ times. Complete this exercise __________ times a day. Extension, standing  Stand and hold a broomstick, a cane, or a similar object behind your back. Your hands should be a little wider than shoulder-width apart. Your palms should face away from your back. Keeping your elbows straight and your shoulder muscles relaxed, move the stick away from your body until you feel a stretch in your shoulders (extension). Avoid shrugging  your shoulders while you move the stick. Keep your shoulder blades tucked down toward the middle of your back. Hold for __________ seconds. Slowly return to the starting position. Repeat __________ times. Complete this exercise __________ times a day. Range-of-motion exercises Pendulum  Stand near a wall or a surface that you can hold onto for balance. Bend at the waist and let your left / right arm hang straight down. Use your other arm to support you. Keep your back straight and do not lock your knees. Relax your left / right arm and shoulder muscles, and move your hips and your trunk so your left / right arm swings freely. Your arm should swing because of the motion of your body, not because you are using your arm or shoulder muscles. Keep moving your hips and trunk so your arm swings in the following directions, as told by your health care provider: Side to side. Forward and backward. In clockwise and counterclockwise circles. Continue each motion for __________ seconds, or for as long as told by your health care provider. Slowly return to the starting position. Repeat __________ times. Complete this exercise __________ times a  day. Shoulder flexion, standing  Stand and hold a broomstick, a cane, or a similar object. Place your hands a little more than shoulder-width apart on the object. Your left / right hand should be palm-up, and your other hand should be palm-down. Keep your elbow straight and your shoulder muscles relaxed. Push the stick up with your healthy arm to raise your left / right arm in front of your body, and then over your head until you feel a stretch in your shoulder (flexion). Avoid shrugging your shoulder while you raise your arm. Keep your shoulder blade tucked down toward the middle of your back. Hold for __________ seconds. Slowly return to the starting position. Repeat __________ times. Complete this exercise __________ times a day. Shoulder abduction, standing  Stand and hold a broomstick, a cane, or a similar object. Place your hands a little more than shoulder-width apart on the object. Your left / right hand should be palm-up, and your other hand should be palm-down. Keep your elbow straight and your shoulder muscles relaxed. Push the object across your body toward your left / right side. Raise your left / right arm to the side of your body (abduction) until you feel a stretch in your shoulder. Do not raise your arm above shoulder height unless your health care provider tells you to do that. If directed, raise your arm over your head. Avoid shrugging your shoulder while you raise your arm. Keep your shoulder blade tucked down toward the middle of your back. Hold for __________ seconds. Slowly return to the starting position. Repeat __________ times. Complete this exercise __________ times a day. Internal rotation  Place your left / right hand behind your back, palm-up. Use your other hand to dangle an exercise band, a broomstick, or a similar object over your shoulder. Grasp the band with your left / right hand so you are holding on to both ends. Gently pull up on the band until you feel a  stretch in the front of your left / right shoulder. The movement of your arm toward the center of your body is called internal rotation. Avoid shrugging your shoulder while you raise your arm. Keep your shoulder blade tucked down toward the middle of your back. Hold for __________ seconds. Release the stretch by letting go of the band and lowering your hands. Repeat __________  times. Complete this exercise __________ times a day. Strengthening exercises External rotation  Sit in a stable chair without armrests. Secure an exercise band to a stable object at elbow height on your left / right side. Place a soft object, such as a folded towel or a small pillow, between your left / right upper arm and your body to move your elbow about 4 inches (10 cm) away from your side. Hold the end of the exercise band so it is tight and there is no slack. Keeping your elbow pressed against the soft object, slowly move your forearm out, away from your abdomen (external rotation). Keep your body steady so only your forearm moves. Hold for __________ seconds. Slowly return to the starting position. Repeat __________ times. Complete this exercise __________ times a day. Shoulder abduction  Sit in a stable chair without armrests, or stand up. Hold a __________ lb / kg weight in your left / right hand, or hold an exercise band with both hands. Start with your arms straight down and your left / right palm facing in, toward your body. Slowly lift your left / right hand out to your side (abduction). Do not lift your hand above shoulder height unless your health care provider tells you that this is safe. Keep your arms straight. Avoid shrugging your shoulder while you do this movement. Keep your shoulder blade tucked down toward the middle of your back. Hold for __________ seconds. Slowly lower your arm, and return to the starting position. Repeat __________ times. Complete this exercise __________ times a  day. Shoulder extension  Sit in a stable chair without armrests, or stand up. Secure an exercise band to a stable object in front of you so it is at shoulder height. Hold one end of the exercise band in each hand. Straighten your elbows and lift your hands up to shoulder height. Squeeze your shoulder blades together as you pull your hands down to the sides of your thighs (extension). Stop when your hands are straight down by your sides. Do not let your hands go behind your body. Hold for __________ seconds. Slowly return to the starting position. Repeat __________ times. Complete this exercise __________ times a day. Shoulder row  Sit in a stable chair without armrests, or stand up. Secure an exercise band to a stable object in front of you so it is at chest height. Hold one end of the exercise band in each hand. Position your palms so that your thumbs are facing the ceiling (neutral position). Bend each of your elbows to a 90-degree angle (right angle) and keep your upper arms at your sides. Step back or move the chair back until the band is tight and there is no slack. Slowly pull your elbows back behind you. Hold for __________ seconds. Slowly return to the starting position. Repeat __________ times. Complete this exercise __________ times a day. Shoulder press-ups  Sit in a stable chair that has armrests. Sit upright, with your feet flat on the floor. Put your hands on the armrests so your elbows are bent and your fingers are pointing forward. Your hands should be about even with the sides of your body. Push down on the armrests and use your arms to lift yourself off the chair. Straighten your elbows and lift yourself up as much as you comfortably can. Move your shoulder blades down, and avoid letting your shoulders move up toward your ears. Keep your feet on the ground. As you get stronger, your feet should  support less of your body weight as you lift yourself up. Hold for  __________ seconds. Slowly lower yourself back into the chair. Repeat __________ times. Complete this exercise __________ times a day. Wall push-ups  Stand so you are facing a stable wall. Your feet should be about one arm-length away from the wall. Lean forward and place your palms on the wall at shoulder height. Keep your feet flat on the floor as you bend your elbows and lean forward toward the wall. Hold for __________ seconds. Straighten your elbows to push yourself back to the starting position. Repeat __________ times. Complete this exercise __________ times a day. This information is not intended to replace advice given to you by your health care provider. Make sure you discuss any questions you have with your health care provider. Document Revised: 07/24/2021 Document Reviewed: 07/24/2021 Elsevier Patient Education  2024 [employer].

## 2023-11-25 LAB — HM DEXA SCAN

## 2023-11-26 ENCOUNTER — Telehealth: Payer: Self-pay | Admitting: *Deleted

## 2023-11-26 NOTE — Telephone Encounter (Signed)
 Received DEXA results from [facility].  Date of Scan: 11/25/2023  Lowest T-score:-3.3  BMD:0.591  Lowest site measured:Right Total Hip  DX: Osteoporosis   Significant changes in BMD and site measured (5% and above): n/a  Current Regimen: Vitamin D   Recommendation: Schedule earlier appointment to discuss anabolic treatment.  Reviewed by: Dr. Nicholas Bari   Next Appointment:  04/13/2024

## 2023-11-26 NOTE — Telephone Encounter (Signed)
 Patient scheduled for 12/11/2023 to discuss treatment options.

## 2023-11-27 NOTE — Progress Notes (Unsigned)
 Office Visit Note  Patient: April Shepard             Date of Birth: [birth year]           MRN: [id number]             PCP: Gailen Olives, MD Referring: Gailen Olives, MD Visit Date: 12/11/2023 Occupation: @GUAROCC @  Subjective:  Discuss treatment options for osteoporosis   History of Present Illness: April Shepard is a 83 y.o. female with history of rheumatoid arthritis.   Patient remains on Cimzia  400 mg sq injection every 28 days, methotrexate  2.5 mg, 5 tablets every 7 days, and folic acid  1 mg, 1 tablet p.o. daily.   DEXA updated on 11/25/2023: Left total hip BMD 0.  619 and T-score -2.1.  Lumbar T-score -1.1.  Right hip total BMD 0.591 with T-score -3.3. FRAX: Major osteoporotic fracture risk 39.9%.  Hip fracture risk 18.3%.   CBC and CMP updated on 10/09/23.  Lipid panel TB gold negative on 04/03/23. Discussed the importance of holding cimzia  and methotrexate  if she develops signs or symptoms of an infection and to resume once the infection has completely cleared.    Activities of Daily Living:  Patient reports morning stiffness for *** {minute/hour:19697}.   Patient {ACTIONS;DENIES/REPORTS:21021675::Denies} nocturnal pain.  Difficulty dressing/grooming: {ACTIONS;DENIES/REPORTS:21021675::Denies} Difficulty climbing stairs: {ACTIONS;DENIES/REPORTS:21021675::Denies} Difficulty getting out of chair: {ACTIONS;DENIES/REPORTS:21021675::Denies} Difficulty using hands for taps, buttons, cutlery, and/or writing: {ACTIONS;DENIES/REPORTS:21021675::Denies}  No Rheumatology ROS completed.   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   High risk medication use 10/22/2016   Trigger finger, right ring finger 10/22/2016   Osteopenia 05/13/2016   Osteoarthritis of both hands 05/13/2016   RA (rheumatoid arthritis) (HCC) 05/13/2016    Past  Medical History:  Diagnosis Date   Osteoarthritis of both hands 05/13/2016   Osteopenia 05/13/2016   RA (rheumatoid arthritis) (HCC) 05/13/2016    Family History  Problem Relation Age of Onset   Breast cancer Mother    Heart attack Father    Melanoma Brother    Past Surgical History:  Procedure Laterality Date   CHOLECYSTECTOMY     Social History   Social History Narrative   Not on file   Immunization History  Administered Date(s) Administered   Influenza, High Dose Seasonal PF 03/27/2018   Moderna Sars-Covid-2 Vaccination 08/17/2019, 09/14/2019, 05/08/2020     Objective: Vital Signs: There were no vitals taken for this visit.   Physical Exam Vitals and nursing note reviewed.  Constitutional:      Appearance: She is well-developed.  HENT:     Head: Normocephalic and atraumatic.   Eyes:     Conjunctiva/sclera: Conjunctivae normal.    Cardiovascular:     Rate and Rhythm: Normal rate and regular rhythm.     Heart sounds: Normal heart sounds.  Pulmonary:     Effort: Pulmonary effort is normal.     Breath sounds: Normal breath sounds.  Abdominal:     General: Bowel sounds are normal.     Palpations: Abdomen is soft.   Musculoskeletal:     Cervical back: Normal range of motion.  Lymphadenopathy:     Cervical: No cervical adenopathy.   Skin:    General: Skin is warm and dry.     Capillary Refill: Capillary refill takes less than 2 seconds.   Neurological:     Mental Status: She is alert and  oriented to person, place, and time.   Psychiatric:        Behavior: Behavior normal.      Musculoskeletal Exam: ***  CDAI Exam: CDAI Score: -- Patient Global: --; Provider Global: -- Swollen: --; Tender: -- Joint Exam 12/11/2023   No joint exam has been documented for this visit   There is currently no information documented on the homunculus. Go to the Rheumatology activity and complete the homunculus joint exam.  Investigation: No additional  findings.  Imaging: XR Shoulder Right Result Date: 11/06/2023 Low riding humerus was noted.  No acromioclavicular joint space narrowing was noted.  No chondrocalcinosis was noted.  Impression: No riding humerus was noted.  These findings are suspicious of rotator cuff tear.  XR KNEE 3 VIEW RIGHT Result Date: 11/06/2023 Moderate to severe lateral compartment narrowing with intercondylar and lateral osteophytes was noted.  No patellofemoral narrowing was noted.  No chondrocalcinosis was noted. 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   Recent Labs: Lab Results  Component Value Date   WBC 11.5 (H) 10/09/2023   HGB 12.9 10/09/2023   PLT 285 10/09/2023   NA 134 (L) 10/09/2023   K 4.3 10/09/2023   CL 99 10/09/2023   CO2 25 10/09/2023   GLUCOSE 95 10/09/2023   BUN 21 10/09/2023   CREATININE 0.74 10/09/2023   BILITOT 0.4 10/09/2023   ALKPHOS 59 05/14/2016   AST 25 10/09/2023   ALT 15 10/09/2023   PROT 6.6 10/09/2023   ALBUMIN 4.5 05/14/2016   CALCIUM 9.4 10/09/2023   GFRAA 83 11/20/2020   QFTBGOLDPLUS NEGATIVE 04/03/2023    Speciality Comments: no baseline biologic labs  Procedures:  No procedures performed Allergies: Patient has no known allergies.   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  No orders of the defined types were placed in this encounter.   Face-to-face time spent with patient  was *** minutes. Greater than 50% of time was spent in counseling and coordination of care.  Follow-Up Instructions: No follow-ups on file.   Waddell CHRISTELLA Craze, PA-C  Note - This record has been created using Dragon software.  Chart creation errors have been sought, but may not always  have been located. Such creation errors do not reflect on  the standard of medical care.

## 2023-12-04 ENCOUNTER — Ambulatory Visit: Payer: MEDICARE | Attending: Rheumatology

## 2023-12-04 VITALS — BP 137/68 | HR 67

## 2023-12-04 DIAGNOSIS — M0609 Rheumatoid arthritis without rheumatoid factor, multiple sites: Secondary | ICD-10-CM | POA: Insufficient documentation

## 2023-12-04 MED ORDER — CERTOLIZUMAB PEGOL 2 X 200 MG ~~LOC~~ KIT
400.0000 mg | PACK | Freq: Once | SUBCUTANEOUS | Status: AC
  Administered 2023-12-04: 400 mg via SUBCUTANEOUS

## 2023-12-04 NOTE — Progress Notes (Signed)
 Subjective:   Patient presents to clinic today to receive monthly dose of Cimzia .  Patient running a fever or have signs/symptoms of infection? No  Patient currently on antibiotics for the treatment of infection? No  Patient have any upcoming invasive procedures/surgeries? No  Objective: CMP     Component Value Date/Time   NA 134 (L) 10/09/2023 1417   NA 138 02/20/2016 0000   K 4.3 10/09/2023 1417   CL 99 10/09/2023 1417   CO2 25 10/09/2023 1417   GLUCOSE 95 10/09/2023 1417   BUN 21 10/09/2023 1417   BUN 13 02/20/2016 0000   CREATININE 0.74 10/09/2023 1417   CALCIUM 9.4 10/09/2023 1417   PROT 6.6 10/09/2023 1417   ALBUMIN 4.5 05/14/2016 1314   AST 25 10/09/2023 1417   ALT 15 10/09/2023 1417   ALKPHOS 59 05/14/2016 1314   BILITOT 0.4 10/09/2023 1417   GFRNONAA 72 11/20/2020 1401   GFRAA 83 11/20/2020 1401    CBC    Component Value Date/Time   WBC 11.5 (H) 10/09/2023 1417   RBC 3.98 10/09/2023 1417   HGB 12.9 10/09/2023 1417   HCT 37.6 10/09/2023 1417   PLT 285 10/09/2023 1417   MCV 94.5 10/09/2023 1417   MCH 32.4 10/09/2023 1417   MCHC 34.3 10/09/2023 1417   RDW 12.7 10/09/2023 1417   LYMPHSABS 1,604 11/19/2022 1418   MONOABS 434 05/14/2016 1314   EOSABS 104 10/09/2023 1417   BASOSABS 81 10/09/2023 1417    Baseline Immunosuppressant Therapy Labs TB GOLD    Latest Ref Rng & Units 04/03/2023    2:18 PM  Quantiferon TB Gold  Quantiferon TB Gold Plus NEGATIVE NEGATIVE    Hepatitis Panel    Latest Ref Rng & Units 03/05/2018    1:53 PM  Hepatitis  Hep B Surface Ag NON-REACTI NON-REACTIVE   Hep B IgM NON-REACTI NON-REACTIVE   Hep C Ab NON-REACTI NON-REACTIVE    HIV Lab Results  Component Value Date   HIV NON-REACTIVE 03/05/2018   Immunoglobulins    Latest Ref Rng & Units 03/05/2018    1:53 PM  Immunoglobulin Electrophoresis  IgA  20 - 320 mg/dL 161   IgG 096 - 0,454 mg/dL 098   IgM 50 - 119 mg/dL 147    SPEP    Latest Ref Rng & Units 10/09/2023     2:17 PM  Serum Protein Electrophoresis  Total Protein 6.1 - 8.1 g/dL 6.6    W2NF Lab Results  Component Value Date   G6PDH 17.9 11/19/2017   TPMT No results found for: TPMT   Chest x-ray: 10/18/2005 Normal   Assessment/Plan:  Administrations This Visit     certolizumab pegol  (CIMZIA ) kit 400 mg     Admin Date 12/04/2023 Action Given Dose 400 mg Route Subcutaneous Documented By Matilda Somerset, CMA              Patient tolerated injection well.   Appointment for next injection scheduled for 01/01/2024.  Patient due for labs in July.  Patient is to call and reschedule appointment if running a fever with signs/symptoms of infection, on antibiotics for active infection or has an upcoming invasive procedure.  All questions encouraged and answered.  Instructed patient to call with any further questions or concerns.

## 2023-12-11 ENCOUNTER — Encounter: Payer: Self-pay | Admitting: Physician Assistant

## 2023-12-11 ENCOUNTER — Ambulatory Visit: Payer: MEDICARE | Attending: Physician Assistant | Admitting: Physician Assistant

## 2023-12-11 ENCOUNTER — Other Ambulatory Visit (HOSPITAL_COMMUNITY): Payer: Self-pay

## 2023-12-11 ENCOUNTER — Telehealth: Payer: Self-pay

## 2023-12-11 VITALS — BP 118/75 | HR 72 | Resp 16 | Ht 62.0 in | Wt 126.8 lb

## 2023-12-11 DIAGNOSIS — T372X5A Adverse effect of antimalarials and drugs acting on other blood protozoa, initial encounter: Secondary | ICD-10-CM | POA: Insufficient documentation

## 2023-12-11 DIAGNOSIS — M19042 Primary osteoarthritis, left hand: Secondary | ICD-10-CM | POA: Insufficient documentation

## 2023-12-11 DIAGNOSIS — Z9181 History of falling: Secondary | ICD-10-CM

## 2023-12-11 DIAGNOSIS — M25561 Pain in right knee: Secondary | ICD-10-CM | POA: Diagnosis present

## 2023-12-11 DIAGNOSIS — S92355S Nondisplaced fracture of fifth metatarsal bone, left foot, sequela: Secondary | ICD-10-CM | POA: Diagnosis present

## 2023-12-11 DIAGNOSIS — E559 Vitamin D deficiency, unspecified: Secondary | ICD-10-CM | POA: Diagnosis present

## 2023-12-11 DIAGNOSIS — R5383 Other fatigue: Secondary | ICD-10-CM | POA: Insufficient documentation

## 2023-12-11 DIAGNOSIS — M25511 Pain in right shoulder: Secondary | ICD-10-CM | POA: Insufficient documentation

## 2023-12-11 DIAGNOSIS — M0609 Rheumatoid arthritis without rheumatoid factor, multiple sites: Secondary | ICD-10-CM | POA: Diagnosis present

## 2023-12-11 DIAGNOSIS — M8589 Other specified disorders of bone density and structure, multiple sites: Secondary | ICD-10-CM | POA: Diagnosis present

## 2023-12-11 DIAGNOSIS — M19071 Primary osteoarthritis, right ankle and foot: Secondary | ICD-10-CM | POA: Insufficient documentation

## 2023-12-11 DIAGNOSIS — M19072 Primary osteoarthritis, left ankle and foot: Secondary | ICD-10-CM | POA: Insufficient documentation

## 2023-12-11 DIAGNOSIS — H35389 Toxic maculopathy, unspecified eye: Secondary | ICD-10-CM | POA: Diagnosis present

## 2023-12-11 DIAGNOSIS — M19041 Primary osteoarthritis, right hand: Secondary | ICD-10-CM | POA: Diagnosis present

## 2023-12-11 DIAGNOSIS — M810 Age-related osteoporosis without current pathological fracture: Secondary | ICD-10-CM

## 2023-12-11 DIAGNOSIS — G8929 Other chronic pain: Secondary | ICD-10-CM | POA: Insufficient documentation

## 2023-12-11 DIAGNOSIS — Z79899 Other long term (current) drug therapy: Secondary | ICD-10-CM | POA: Diagnosis present

## 2023-12-11 DIAGNOSIS — Z8781 Personal history of (healed) traumatic fracture: Secondary | ICD-10-CM | POA: Insufficient documentation

## 2023-12-11 DIAGNOSIS — Z8619 Personal history of other infectious and parasitic diseases: Secondary | ICD-10-CM | POA: Diagnosis present

## 2023-12-11 DIAGNOSIS — E785 Hyperlipidemia, unspecified: Secondary | ICD-10-CM | POA: Insufficient documentation

## 2023-12-11 MED ORDER — METHOTREXATE SODIUM 2.5 MG PO TABS: 12.5000 mg | ORAL_TABLET | ORAL | 0 refills | Status: DC

## 2023-12-11 NOTE — Telephone Encounter (Signed)
 Received notification from [employer] regarding a prior authorization for TYMLOS. Authorization has been APPROVED from 12/11/2023 to 06/16/2024. Approval letter sent to scan center.  Per test claim, copay for 30 days supply is $1251.20  Will need to move forward with Tymlos PAP once patient returns her form with income documents

## 2023-12-11 NOTE — Patient Instructions (Addendum)
 Please bring Tymlos application with income documents to next Cimzia  appointment  Shoulder Exercises Ask your health care provider which exercises are safe for you. Do exercises exactly as told by your health care provider and adjust them as directed. It is normal to feel mild stretching, pulling, tightness, or discomfort as you do these exercises. Stop right away if you feel sudden pain or your pain gets worse. Do not begin these exercises until told by your health care provider. Stretching exercises External rotation and abduction This exercise is sometimes called corner stretch. The exercise rotates your arm outward (external rotation) and moves your arm out from your body (abduction). Stand in a doorway with one of your feet slightly in front of the other. This is called a staggered stance. If you cannot reach your forearms to the door frame, stand facing a corner of a room. Choose one of the following positions as told by your health care provider: Place your hands and forearms on the door frame above your head. Place your hands and forearms on the door frame at the height of your head. Place your hands on the door frame at the height of your elbows. Slowly move your weight onto your front foot until you feel a stretch across your chest and in the front of your shoulders. Keep your head and chest upright and keep your abdominal muscles tight. Hold for __________ seconds. To release the stretch, shift your weight to your back foot. Repeat __________ times. Complete this exercise __________ times a day. Extension, standing  Stand and hold a broomstick, a cane, or a similar object behind your back. Your hands should be a little wider than shoulder-width apart. Your palms should face away from your back. Keeping your elbows straight and your shoulder muscles relaxed, move the stick away from your body until you feel a stretch in your shoulders (extension). Avoid shrugging your shoulders while  you move the stick. Keep your shoulder blades tucked down toward the middle of your back. Hold for __________ seconds. Slowly return to the starting position. Repeat __________ times. Complete this exercise __________ times a day. Range-of-motion exercises Pendulum  Stand near a wall or a surface that you can hold onto for balance. Bend at the waist and let your left / right arm hang straight down. Use your other arm to support you. Keep your back straight and do not lock your knees. Relax your left / right arm and shoulder muscles, and move your hips and your trunk so your left / right arm swings freely. Your arm should swing because of the motion of your body, not because you are using your arm or shoulder muscles. Keep moving your hips and trunk so your arm swings in the following directions, as told by your health care provider: Side to side. Forward and backward. In clockwise and counterclockwise circles. Continue each motion for __________ seconds, or for as long as told by your health care provider. Slowly return to the starting position. Repeat __________ times. Complete this exercise __________ times a day. Shoulder flexion, standing  Stand and hold a broomstick, a cane, or a similar object. Place your hands a little more than shoulder-width apart on the object. Your left / right hand should be palm-up, and your other hand should be palm-down. Keep your elbow straight and your shoulder muscles relaxed. Push the stick up with your healthy arm to raise your left / right arm in front of your body, and then over your head until  you feel a stretch in your shoulder (flexion). Avoid shrugging your shoulder while you raise your arm. Keep your shoulder blade tucked down toward the middle of your back. Hold for __________ seconds. Slowly return to the starting position. Repeat __________ times. Complete this exercise __________ times a day. Shoulder abduction, standing  Stand and hold a  broomstick, a cane, or a similar object. Place your hands a little more than shoulder-width apart on the object. Your left / right hand should be palm-up, and your other hand should be palm-down. Keep your elbow straight and your shoulder muscles relaxed. Push the object across your body toward your left / right side. Raise your left / right arm to the side of your body (abduction) until you feel a stretch in your shoulder. Do not raise your arm above shoulder height unless your health care provider tells you to do that. If directed, raise your arm over your head. Avoid shrugging your shoulder while you raise your arm. Keep your shoulder blade tucked down toward the middle of your back. Hold for __________ seconds. Slowly return to the starting position. Repeat __________ times. Complete this exercise __________ times a day. Internal rotation  Place your left / right hand behind your back, palm-up. Use your other hand to dangle an exercise band, a broomstick, or a similar object over your shoulder. Grasp the band with your left / right hand so you are holding on to both ends. Gently pull up on the band until you feel a stretch in the front of your left / right shoulder. The movement of your arm toward the center of your body is called internal rotation. Avoid shrugging your shoulder while you raise your arm. Keep your shoulder blade tucked down toward the middle of your back. Hold for __________ seconds. Release the stretch by letting go of the band and lowering your hands. Repeat __________ times. Complete this exercise __________ times a day. Strengthening exercises External rotation  Sit in a stable chair without armrests. Secure an exercise band to a stable object at elbow height on your left / right side. Place a soft object, such as a folded towel or a small pillow, between your left / right upper arm and your body to move your elbow about 4 inches (10 cm) away from your side. Hold the  end of the exercise band so it is tight and there is no slack. Keeping your elbow pressed against the soft object, slowly move your forearm out, away from your abdomen (external rotation). Keep your body steady so only your forearm moves. Hold for __________ seconds. Slowly return to the starting position. Repeat __________ times. Complete this exercise __________ times a day. Shoulder abduction  Sit in a stable chair without armrests, or stand up. Hold a __________ lb / kg weight in your left / right hand, or hold an exercise band with both hands. Start with your arms straight down and your left / right palm facing in, toward your body. Slowly lift your left / right hand out to your side (abduction). Do not lift your hand above shoulder height unless your health care provider tells you that this is safe. Keep your arms straight. Avoid shrugging your shoulder while you do this movement. Keep your shoulder blade tucked down toward the middle of your back. Hold for __________ seconds. Slowly lower your arm, and return to the starting position. Repeat __________ times. Complete this exercise __________ times a day. Shoulder extension  Sit in a stable  chair without armrests, or stand up. Secure an exercise band to a stable object in front of you so it is at shoulder height. Hold one end of the exercise band in each hand. Straighten your elbows and lift your hands up to shoulder height. Squeeze your shoulder blades together as you pull your hands down to the sides of your thighs (extension). Stop when your hands are straight down by your sides. Do not let your hands go behind your body. Hold for __________ seconds. Slowly return to the starting position. Repeat __________ times. Complete this exercise __________ times a day. Shoulder row  Sit in a stable chair without armrests, or stand up. Secure an exercise band to a stable object in front of you so it is at chest height. Hold one end of  the exercise band in each hand. Position your palms so that your thumbs are facing the ceiling (neutral position). Bend each of your elbows to a 90-degree angle (right angle) and keep your upper arms at your sides. Step back or move the chair back until the band is tight and there is no slack. Slowly pull your elbows back behind you. Hold for __________ seconds. Slowly return to the starting position. Repeat __________ times. Complete this exercise __________ times a day. Shoulder press-ups  Sit in a stable chair that has armrests. Sit upright, with your feet flat on the floor. Put your hands on the armrests so your elbows are bent and your fingers are pointing forward. Your hands should be about even with the sides of your body. Push down on the armrests and use your arms to lift yourself off the chair. Straighten your elbows and lift yourself up as much as you comfortably can. Move your shoulder blades down, and avoid letting your shoulders move up toward your ears. Keep your feet on the ground. As you get stronger, your feet should support less of your body weight as you lift yourself up. Hold for __________ seconds. Slowly lower yourself back into the chair. Repeat __________ times. Complete this exercise __________ times a day. Wall push-ups  Stand so you are facing a stable wall. Your feet should be about one arm-length away from the wall. Lean forward and place your palms on the wall at shoulder height. Keep your feet flat on the floor as you bend your elbows and lean forward toward the wall. Hold for __________ seconds. Straighten your elbows to push yourself back to the starting position. Repeat __________ times. Complete this exercise __________ times a day. This information is not intended to replace advice given to you by your health care provider. Make sure you discuss any questions you have with your health care provider. Document Revised: 07/24/2021 Document Reviewed:  07/24/2021 Elsevier Patient Education  2024 [employer].

## 2023-12-11 NOTE — Telephone Encounter (Signed)
-----   Message from Sherry GORMAN Pennant sent at 12/11/2023 11:51 AM EDT ----- Signed provider form sent to On[employer]. ----- Message ----- From: Pennant Sherry GORMAN, RPH-CPP Sent: 12/11/2023   9:49 AM EDT To: Rx Rheum/Pulm  Patient will be Tymlos new start. Patient provided with Tymlos pt assistance application today. She will complete and return with SS benefits statement at next Cimzia  in-office injection appt. Prescriber form placed in Waddell Craze, PA-C's folder for signature

## 2023-12-11 NOTE — Telephone Encounter (Signed)
 Submitted a Prior Authorization request to [employer] for TYMLOS via CoverMyMeds. Will update once we receive a response.  Key: [id number]

## 2023-12-11 NOTE — Progress Notes (Signed)
 Pharmacy Note  Subjective:  Patient presents today to [facility] for follow up office visit.   Patient was seen by the pharmacist for counseling on Tymlos. She was on alendronate many years ago for osteopenia. Has history of pubic fracture and metatarsal fracture. Denies frequent falls or recent fractures  Objective: CMP     Component Value Date/Time   NA 134 (L) 10/09/2023 1417   NA 138 02/20/2016 0000   K 4.3 10/09/2023 1417   CL 99 10/09/2023 1417   CO2 25 10/09/2023 1417   GLUCOSE 95 10/09/2023 1417   BUN 21 10/09/2023 1417   BUN 13 02/20/2016 0000   CREATININE 0.74 10/09/2023 1417   CALCIUM 9.4 10/09/2023 1417   PROT 6.6 10/09/2023 1417   ALBUMIN 4.5 05/14/2016 1314   AST 25 10/09/2023 1417   ALT 15 10/09/2023 1417   ALKPHOS 59 05/14/2016 1314   BILITOT 0.4 10/09/2023 1417   GFRNONAA 72 11/20/2020 1401   GFRAA 83 11/20/2020 1401    Vitamin D  Lab Results  Component Value Date   VD25OH 33 03/25/2019   Date of Scan: 11/25/2023;  Lowest T-score:-3.3; BMD:0.591   Lowest site measured:Right Total Hip  Assessment/Plan:    Counseled patient on purpose, proper use, storage, and adverse effects of Tymlos. Discussed the Tymlos is PTH peptide agonist which results in stimulation of osteoblast and bone mass. Reviewed that Tymlos treatment course is for 2 years. Discussed that pen is stable for 30 days after first use and at room temperature. Counseled patient that Tymlos is a medication that must be injected once daily and that prescription for pen needles will be sent with Tymlos prescription.  Advised patient to continue taking calcium 1200 mg daily and vitamin D  supplement.  Reviewed the most common adverse effects of Tymlos including orthostatic hypotension, hypercalciuria, antibody development (which has no clinical significance), GI upset, injection site reaction, and joint pain/arthralgia.  Discussed injection site reaction management and injection site locations.  Discussed alternating injection site.  Discussed management of dizziness (which can occur for up to 4 hours after injection) including adequate hydration, slowly rising from bed/chairs to prevent fals, and taking Tymlos at night if needed.  We walked through Tymlos administration with demo pen and pen needle.  Discussed appropriate disposal of sharps.     Tymlos prescription pending baseline labs and pharmacy benefits investigation. Patient provided with Tymlos pt assistance application today. She will complete and return with SS benefits statement at next Cimzia  in-office injection appt. Provider form placed in Waddell Craze, PA-C's folder for signature   All questions encouraged and answered.    Sherry Pennant, PharmD, MPH, BCPS, CPP Clinical Pharmacist (Rheumatology and Pulmonology)

## 2023-12-13 LAB — PROTEIN ELECTROPHORESIS, SERUM, WITH REFLEX
Albumin ELP: 4.1 g/dL (ref 3.8–4.8)
Alpha 1: 0.3 g/dL (ref 0.2–0.3)
Alpha 2: 0.8 g/dL (ref 0.5–0.9)
Beta 2: 0.3 g/dL (ref 0.2–0.5)
Beta Globulin: 0.4 g/dL (ref 0.4–0.6)
Gamma Globulin: 0.8 g/dL (ref 0.8–1.7)
Total Protein: 6.7 g/dL (ref 6.1–8.1)

## 2023-12-13 LAB — TSH: TSH: 1.28 m[IU]/L (ref 0.40–4.50)

## 2023-12-13 LAB — COMPREHENSIVE METABOLIC PANEL WITH GFR
AG Ratio: 2 (calc) (ref 1.0–2.5)
ALT: 17 U/L (ref 6–29)
AST: 24 U/L (ref 10–35)
Albumin: 4.5 g/dL (ref 3.6–5.1)
Alkaline phosphatase (APISO): 121 U/L (ref 37–153)
BUN: 17 mg/dL (ref 7–25)
CO2: 26 mmol/L (ref 20–32)
Calcium: 9.7 mg/dL (ref 8.6–10.4)
Chloride: 96 mmol/L — ABNORMAL LOW (ref 98–110)
Creat: 0.71 mg/dL (ref 0.60–0.95)
Globulin: 2.3 g/dL (ref 1.9–3.7)
Glucose, Bld: 82 mg/dL (ref 65–99)
Potassium: 4.5 mmol/L (ref 3.5–5.3)
Sodium: 131 mmol/L — ABNORMAL LOW (ref 135–146)
Total Bilirubin: 0.6 mg/dL (ref 0.2–1.2)
Total Protein: 6.8 g/dL (ref 6.1–8.1)
eGFR: 84 mL/min/{1.73_m2} (ref >=60)

## 2023-12-13 LAB — CBC WITH DIFFERENTIAL/PLATELET
Absolute Lymphocytes: 2039 {cells}/uL (ref 850–3900)
Absolute Monocytes: 793 {cells}/uL (ref 200–950)
Basophils Absolute: 72 {cells}/uL (ref 0–200)
Basophils Relative: 0.7 %
Eosinophils Absolute: 103 {cells}/uL (ref 15–500)
Eosinophils Relative: 1 %
HCT: 40.3 % (ref 35.0–45.0)
Hemoglobin: 13.3 g/dL (ref 11.7–15.5)
MCH: 32.6 pg (ref 27.0–33.0)
MCHC: 33 g/dL (ref 32.0–36.0)
MCV: 98.8 fL (ref 80.0–100.0)
MPV: 8.2 fL (ref 7.5–12.5)
Monocytes Relative: 7.7 %
Neutro Abs: 7292 {cells}/uL (ref 1500–7800)
Neutrophils Relative %: 70.8 %
Platelets: 313 10*3/uL (ref 140–400)
RBC: 4.08 10*6/uL (ref 3.80–5.10)
RDW: 13.1 % (ref 11.0–15.0)
Total Lymphocyte: 19.8 %
WBC: 10.3 10*3/uL (ref 3.8–10.8)

## 2023-12-13 LAB — LIPID PANEL
Cholesterol: 171 mg/dL (ref <200)
HDL: 72 mg/dL (ref >=50)
LDL Cholesterol (Calc): 78 mg/dL
Non-HDL Cholesterol (Calc): 99 mg/dL (ref <130)
Total CHOL/HDL Ratio: 2.4 (calc) (ref <5.0)
Triglycerides: 128 mg/dL (ref <150)

## 2023-12-13 LAB — PARATHYROID HORMONE, INTACT (NO CA): PTH: 20 pg/mL (ref 16–77)

## 2023-12-13 LAB — PHOSPHORUS: Phosphorus: 4.5 mg/dL — ABNORMAL HIGH (ref 2.1–4.3)

## 2023-12-13 LAB — VITAMIN D 25 HYDROXY (VIT D DEFICIENCY, FRACTURES): Vit D, 25-Hydroxy: 77 ng/mL (ref 30–100)

## 2023-12-14 ENCOUNTER — Ambulatory Visit: Payer: Self-pay | Admitting: Physician Assistant

## 2023-12-14 NOTE — Progress Notes (Signed)
 Vitamin D  WNL CBC WNL TSH WNL Sodium is low-131. Please notify the patient and forward results to PCP. Rest CMP WNL.  Phosphorus is elevated-4.5-plan to recheck with next lab work.   PTH WNL SPEP normal

## 2023-12-15 ENCOUNTER — Other Ambulatory Visit: Payer: Self-pay | Admitting: *Deleted

## 2023-12-15 DIAGNOSIS — Z79899 Other long term (current) drug therapy: Secondary | ICD-10-CM

## 2023-12-15 DIAGNOSIS — M0609 Rheumatoid arthritis without rheumatoid factor, multiple sites: Secondary | ICD-10-CM

## 2023-12-15 DIAGNOSIS — R5383 Other fatigue: Secondary | ICD-10-CM

## 2024-01-01 ENCOUNTER — Ambulatory Visit: Payer: MEDICARE

## 2024-01-05 ENCOUNTER — Telehealth: Payer: Self-pay | Admitting: *Deleted

## 2024-01-05 NOTE — Telephone Encounter (Signed)
 Returned call to patient to and scheduled an appointment for 01/06/2024 at 2:00 pm.

## 2024-01-05 NOTE — Telephone Encounter (Signed)
 Patient contacted the office to rescheduled her Cimzia  injection. Patient states she was on an antibiotic for a UTI. Patient states she completed the medication yesterday. Patient has not followed up with PCP to make sure it has cleared. Does patient to need to follow up with PCP prior to receiving Cimzia  injection?

## 2024-01-05 NOTE — Telephone Encounter (Signed)
 If symptoms have cleared and she has completed course of antibiotics-- ok to proceed with cimzia  injection.

## 2024-01-05 NOTE — Telephone Encounter (Addendum)
 Called patient regarding Tymlos PAP paperwork. Patient planned to bring in paperwork during Cimzia  appointment on 01/01/2024 but developed an infection requiring antibiotics. Patient has since gotten better and completed her antibiotic regimen yesterday. Patient wants to reschedule her Cimzia  appointment and bring in her paperwork on the same day. Patient hung up toward the end of encounter. Alfonso will call patient back to reschedule Cimzia  appointment.   **of note, patient was prescribed 7-day course of ciprofloxacin for UTI on 7/15/2025THORA Deleta Colt PharmD Candidate 2026  [facility]

## 2024-01-06 ENCOUNTER — Ambulatory Visit: Payer: MEDICARE | Attending: Rheumatology | Admitting: *Deleted

## 2024-01-06 VITALS — BP 133/76 | HR 79

## 2024-01-06 DIAGNOSIS — M0609 Rheumatoid arthritis without rheumatoid factor, multiple sites: Secondary | ICD-10-CM | POA: Insufficient documentation

## 2024-01-06 MED ORDER — CERTOLIZUMAB PEGOL 2 X 200 MG ~~LOC~~ KIT
400.0000 mg | PACK | Freq: Once | SUBCUTANEOUS | Status: AC
  Administered 2024-01-06: 400 mg via SUBCUTANEOUS

## 2024-01-06 NOTE — Progress Notes (Signed)
 Pharmacy Note  Subjective:   Patient presents to clinic today to receive monthly dose of Cimzia .  Patient running a fever or have signs/symptoms of infection? No  Patient currently on antibiotics for the treatment of infection? No  Patient have any upcoming invasive procedures/surgeries? No  Objective: CMP     Component Value Date/Time   NA 131 (L) 12/11/2023 0947   NA 138 02/20/2016 0000   K 4.5 12/11/2023 0947   CL 96 (L) 12/11/2023 0947   CO2 26 12/11/2023 0947   GLUCOSE 82 12/11/2023 0947   BUN 17 12/11/2023 0947   BUN 13 02/20/2016 0000   CREATININE 0.71 12/11/2023 0947   CALCIUM 9.7 12/11/2023 0947   PROT 6.7 12/11/2023 0947   PROT 6.8 12/11/2023 0947   ALBUMIN 4.5 05/14/2016 1314   AST 24 12/11/2023 0947   ALT 17 12/11/2023 0947   ALKPHOS 59 05/14/2016 1314   BILITOT 0.6 12/11/2023 0947   GFRNONAA 72 11/20/2020 1401   GFRAA 83 11/20/2020 1401    CBC    Component Value Date/Time   WBC 10.3 12/11/2023 0947   RBC 4.08 12/11/2023 0947   HGB 13.3 12/11/2023 0947   HCT 40.3 12/11/2023 0947   PLT 313 12/11/2023 0947   MCV 98.8 12/11/2023 0947   MCH 32.6 12/11/2023 0947   MCHC 33.0 12/11/2023 0947   RDW 13.1 12/11/2023 0947   LYMPHSABS 1,604 11/19/2022 1418   MONOABS 434 05/14/2016 1314   EOSABS 103 12/11/2023 0947   BASOSABS 72 12/11/2023 0947    Baseline Immunosuppressant Therapy Labs TB GOLD    Latest Ref Rng & Units 04/03/2023    2:18 PM  Quantiferon TB Gold  Quantiferon TB Gold Plus NEGATIVE NEGATIVE    Hepatitis Panel    Latest Ref Rng & Units 03/05/2018    1:53 PM  Hepatitis  Hep B Surface Ag NON-REACTI NON-REACTIVE   Hep B IgM NON-REACTI NON-REACTIVE   Hep C Ab NON-REACTI NON-REACTIVE    HIV Lab Results  Component Value Date   HIV NON-REACTIVE 03/05/2018   Immunoglobulins    Latest Ref Rng & Units 03/05/2018    1:53 PM  Immunoglobulin Electrophoresis  IgA  20 - 320 mg/dL 880   IgG 399 - 8,459 mg/dL 155   IgM 50 - 699 mg/dL 843     SPEP    Latest Ref Rng & Units 12/11/2023    9:47 AM  Serum Protein Electrophoresis  Total Protein 6.1 - 8.1 g/dL 6.1 - 8.1 g/dL 6.7    6.8   Albumin 3.8 - 4.8 g/dL 4.1   Alpha-1 0.2 - 0.3 g/dL 0.3   Alpha-2 0.5 - 0.9 g/dL 0.8   Beta Globulin 0.4 - 0.6 g/dL 0.4   Beta 2 0.2 - 0.5 g/dL 0.3   Gamma Globulin 0.8 - 1.7 g/dL 0.8   Interpretation  --    G6PD Lab Results  Component Value Date   G6PDH 17.9 11/19/2017   TPMT No results found for: TPMT   Chest x-ray:  10/18/2005 Normal   Assessment/Plan:   Administrations This Visit     certolizumab pegol  (CIMZIA ) kit 400 mg     Admin Date 01/06/2024 Action Given Dose 400 mg Route Subcutaneous Documented By Cena Alfonso CROME, LPN             Patient tolerated injection well.   Appointment for next injection scheduled for 02/03/2024.  Patient due for labs in September 2025.  Patient is to call and reschedule appointment  if running a fever with signs/symptoms of infection, on antibiotics for active infection or has an upcoming invasive procedure.  All questions encouraged and answered.  Instructed patient to call with any further questions or concerns.

## 2024-01-08 NOTE — Telephone Encounter (Signed)
 Patient dropped off Tymlos PAP with income documents. 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 Confirmed this multiple times with her  Submitted Patient Assistance Application to [employer] for [facility] along with provider portion, patient portion, PA, medication list, insurance card copy and income documents. Will update patient when we receive a response.  Fax: [phone] Phone: [phone]  Sherry Pennant, PharmD, MPH, BCPS, CPP Clinical Pharmacist (Rheumatology and Pulmonology)

## 2024-01-13 MED ORDER — TYMLOS 3120 MCG/1.56ML ~~LOC~~ SOPN: 80.0000 ug | PEN_INJECTOR | Freq: Every day | SUBCUTANEOUS | 4 refills | Status: AC

## 2024-01-13 MED ORDER — INSULIN PEN NEEDLE 31G X 5 MM MISC
Status: AC
Start: 2024-01-13 — End: ?

## 2024-01-13 NOTE — Telephone Encounter (Signed)
 Received a fax from  [employer] regarding an approval for TYMLOS  patient assistance from 01/12/2024 to 06/16/2024. Approval letter sent to scan center.  Fax: [phone] Phone: [phone]  Rx will be shipped from [facility]  Sherry Pennant, PharmD, MPH, BCPS, CPP Clinical Pharmacist (Rheumatology and Pulmonology)

## 2024-01-13 NOTE — Telephone Encounter (Signed)
 Called patient regarding Tymlos  PAP approval. Patient had already received call from [employer] and set up shipment of Tymlos  and syringes to home. Patient well informed on medication side effects including dizziness. Patient was able to explain step by step how to perform the injection including alternating injection sites. Patient notes she has both the clinic and [employer] phone number in case she has any questions regarding Tymlos  use. Patient extremely grateful to Dr. Dolphus and the pharmacy for setting all of this up. Patient looks forward to her first dose on Friday.  Deleta Colt PharmD Candidate [phone]  [facility]

## 2024-01-16 ENCOUNTER — Telehealth: Payer: Self-pay

## 2024-01-16 NOTE — Telephone Encounter (Signed)
 Patient contacted the office to inquire when her next nurse visit was. Advised the patient she is schedule on 02/03/2024 at 2:00 pm. Patient verbalized understanding.

## 2024-02-03 ENCOUNTER — Ambulatory Visit: Payer: MEDICARE

## 2024-02-05 ENCOUNTER — Ambulatory Visit: Payer: MEDICARE | Attending: Rheumatology | Admitting: *Deleted

## 2024-02-05 VITALS — BP 126/69 | HR 80

## 2024-02-05 DIAGNOSIS — M0609 Rheumatoid arthritis without rheumatoid factor, multiple sites: Secondary | ICD-10-CM | POA: Diagnosis present

## 2024-02-05 MED ORDER — CERTOLIZUMAB PEGOL 2 X 200 MG ~~LOC~~ KIT
400.0000 mg | PACK | Freq: Once | SUBCUTANEOUS | Status: AC
Start: 2024-02-05 — End: 2024-02-05
  Administered 2024-02-05: 400 mg via SUBCUTANEOUS

## 2024-02-05 NOTE — Progress Notes (Signed)
 Subjective:   Patient presents to clinic today to receive monthly dose of Cimzia .  Patient running a fever or have signs/symptoms of infection? No  Patient currently on antibiotics for the treatment of infection? No  Patient have any upcoming invasive procedures/surgeries? No  Objective: CMP     Component Value Date/Time   NA 131 (L) 12/11/2023 0947   NA 138 02/20/2016 0000   K 4.5 12/11/2023 0947   CL 96 (L) 12/11/2023 0947   CO2 26 12/11/2023 0947   GLUCOSE 82 12/11/2023 0947   BUN 17 12/11/2023 0947   BUN 13 02/20/2016 0000   CREATININE 0.71 12/11/2023 0947   CALCIUM 9.7 12/11/2023 0947   PROT 6.7 12/11/2023 0947   PROT 6.8 12/11/2023 0947   ALBUMIN 4.5 05/14/2016 1314   AST 24 12/11/2023 0947   ALT 17 12/11/2023 0947   ALKPHOS 59 05/14/2016 1314   BILITOT 0.6 12/11/2023 0947   GFRNONAA 72 11/20/2020 1401   GFRAA 83 11/20/2020 1401    CBC    Component Value Date/Time   WBC 10.3 12/11/2023 0947   RBC 4.08 12/11/2023 0947   HGB 13.3 12/11/2023 0947   HCT 40.3 12/11/2023 0947   PLT 313 12/11/2023 0947   MCV 98.8 12/11/2023 0947   MCH 32.6 12/11/2023 0947   MCHC 33.0 12/11/2023 0947   RDW 13.1 12/11/2023 0947   LYMPHSABS 1,604 11/19/2022 1418   MONOABS 434 05/14/2016 1314   EOSABS 103 12/11/2023 0947   BASOSABS 72 12/11/2023 0947    Baseline Immunosuppressant Therapy Labs TB GOLD    Latest Ref Rng & Units 04/03/2023    2:18 PM  Quantiferon TB Gold  Quantiferon TB Gold Plus NEGATIVE NEGATIVE    Hepatitis Panel    Latest Ref Rng & Units 03/05/2018    1:53 PM  Hepatitis  Hep B Surface Ag NON-REACTI NON-REACTIVE   Hep B IgM NON-REACTI NON-REACTIVE   Hep C Ab NON-REACTI NON-REACTIVE    HIV Lab Results  Component Value Date   HIV NON-REACTIVE 03/05/2018   Immunoglobulins    Latest Ref Rng & Units 03/05/2018    1:53 PM  Immunoglobulin Electrophoresis  IgA  20 - 320 mg/dL 880   IgG 399 - 8,459 mg/dL 155   IgM 50 - 699 mg/dL 843    SPEP     Latest Ref Rng & Units 12/11/2023    9:47 AM  Serum Protein Electrophoresis  Total Protein 6.1 - 8.1 g/dL 6.1 - 8.1 g/dL 6.7    6.8   Albumin 3.8 - 4.8 g/dL 4.1   Alpha-1 0.2 - 0.3 g/dL 0.3   Alpha-2 0.5 - 0.9 g/dL 0.8   Beta Globulin 0.4 - 0.6 g/dL 0.4   Beta 2 0.2 - 0.5 g/dL 0.3   Gamma Globulin 0.8 - 1.7 g/dL 0.8   Interpretation  --    G6PD Lab Results  Component Value Date   G6PDH 17.9 11/19/2017   TPMT No results found for: TPMT   Chest x-ray: 10/18/2005 Normal   Assessment/Plan:   Patient tolerated injection well.   Appointment for next injection scheduled for 03/04/2024.  Patient due for labs in September 2025.  Patient is to call and reschedule appointment if running a fever with signs/symptoms of infection, on antibiotics for active infection or has an upcoming invasive procedure.  All questions encouraged and answered.  Instructed patient to call with any further questions or concerns.

## 2024-02-10 ENCOUNTER — Telehealth: Payer: Self-pay | Admitting: *Deleted

## 2024-02-10 DIAGNOSIS — M25511 Pain in right shoulder: Secondary | ICD-10-CM

## 2024-02-10 NOTE — Telephone Encounter (Signed)
 Patient contacted the office and left message requesting a referral for Physical therapy for her right shoulder. Patient states she would like to go to [facility] in [place], [place]. Phone number [phone] and fax number [phone]. Okay to place referral?

## 2024-02-10 NOTE — Addendum Note (Signed)
 Addended by: CENA ALFONSO CROME on: 02/10/2024 02:50 PM   Modules accepted: Orders

## 2024-02-10 NOTE — Telephone Encounter (Signed)
 Referral placed.

## 2024-02-10 NOTE — Telephone Encounter (Signed)
 Ok to place referral

## 2024-03-04 ENCOUNTER — Ambulatory Visit: Payer: MEDICARE | Attending: Rheumatology | Admitting: *Deleted

## 2024-03-04 VITALS — BP 166/71 | HR 80

## 2024-03-04 DIAGNOSIS — M0609 Rheumatoid arthritis without rheumatoid factor, multiple sites: Secondary | ICD-10-CM | POA: Insufficient documentation

## 2024-03-04 DIAGNOSIS — Z79899 Other long term (current) drug therapy: Secondary | ICD-10-CM | POA: Insufficient documentation

## 2024-03-04 LAB — CBC WITH DIFFERENTIAL/PLATELET
Absolute Lymphocytes: 1978 {cells}/uL (ref 850–3900)
Absolute Monocytes: 657 {cells}/uL (ref 200–950)
Basophils Absolute: 62 {cells}/uL (ref 0–200)
Basophils Relative: 1 %
Eosinophils Absolute: 124 {cells}/uL (ref 15–500)
Eosinophils Relative: 2 %
HCT: 37.5 % (ref 35.0–45.0)
Hemoglobin: 12.6 g/dL (ref 11.7–15.5)
MCH: 31.9 pg (ref 27.0–33.0)
MCHC: 33.6 g/dL (ref 32.0–36.0)
MCV: 94.9 fL (ref 80.0–100.0)
MPV: 8.5 fL (ref 7.5–12.5)
Monocytes Relative: 10.6 %
Neutro Abs: 3379 {cells}/uL (ref 1500–7800)
Neutrophils Relative %: 54.5 %
Platelets: 266 Thousand/uL (ref 140–400)
RBC: 3.95 Million/uL (ref 3.80–5.10)
RDW: 13 % (ref 11.0–15.0)
Total Lymphocyte: 31.9 %
WBC: 6.2 Thousand/uL (ref 3.8–10.8)

## 2024-03-04 MED ORDER — CERTOLIZUMAB PEGOL 2 X 200 MG ~~LOC~~ KIT
400.0000 mg | PACK | Freq: Once | SUBCUTANEOUS | Status: AC
  Administered 2024-03-04: 400 mg via SUBCUTANEOUS

## 2024-03-04 NOTE — Progress Notes (Signed)
 Subjective:   Patient presents to clinic today to receive monthly dose of Cimzia .  Patient running a fever or have signs/symptoms of infection? No  Patient currently on antibiotics for the treatment of infection? No  Patient have any upcoming invasive procedures/surgeries? No  Objective: CMP     Component Value Date/Time   NA 131 (L) 12/11/2023 0947   NA 138 02/20/2016 0000   K 4.5 12/11/2023 0947   CL 96 (L) 12/11/2023 0947   CO2 26 12/11/2023 0947   GLUCOSE 82 12/11/2023 0947   BUN 17 12/11/2023 0947   BUN 13 02/20/2016 0000   CREATININE 0.71 12/11/2023 0947   CALCIUM 9.7 12/11/2023 0947   PROT 6.7 12/11/2023 0947   PROT 6.8 12/11/2023 0947   ALBUMIN 4.5 05/14/2016 1314   AST 24 12/11/2023 0947   ALT 17 12/11/2023 0947   ALKPHOS 59 05/14/2016 1314   BILITOT 0.6 12/11/2023 0947   GFRNONAA 72 11/20/2020 1401   GFRAA 83 11/20/2020 1401    CBC    Component Value Date/Time   WBC 10.3 12/11/2023 0947   RBC 4.08 12/11/2023 0947   HGB 13.3 12/11/2023 0947   HCT 40.3 12/11/2023 0947   PLT 313 12/11/2023 0947   MCV 98.8 12/11/2023 0947   MCH 32.6 12/11/2023 0947   MCHC 33.0 12/11/2023 0947   RDW 13.1 12/11/2023 0947   LYMPHSABS 1,604 11/19/2022 1418   MONOABS 434 05/14/2016 1314   EOSABS 103 12/11/2023 0947   BASOSABS 72 12/11/2023 0947    Baseline Immunosuppressant Therapy Labs TB GOLD    Latest Ref Rng & Units 04/03/2023    2:18 PM  Quantiferon TB Gold  Quantiferon TB Gold Plus NEGATIVE NEGATIVE    Hepatitis Panel    Latest Ref Rng & Units 03/05/2018    1:53 PM  Hepatitis  Hep B Surface Ag NON-REACTI NON-REACTIVE   Hep B IgM NON-REACTI NON-REACTIVE   Hep C Ab NON-REACTI NON-REACTIVE    HIV Lab Results  Component Value Date   HIV NON-REACTIVE 03/05/2018   Immunoglobulins    Latest Ref Rng & Units 03/05/2018    1:53 PM  Immunoglobulin Electrophoresis  IgA  20 - 320 mg/dL 880   IgG 399 - 8,459 mg/dL 155   IgM 50 - 699 mg/dL 843    SPEP     Latest Ref Rng & Units 12/11/2023    9:47 AM  Serum Protein Electrophoresis  Total Protein 6.1 - 8.1 g/dL 6.1 - 8.1 g/dL 6.7    6.8   Albumin 3.8 - 4.8 g/dL 4.1   Alpha-1 0.2 - 0.3 g/dL 0.3   Alpha-2 0.5 - 0.9 g/dL 0.8   Beta Globulin 0.4 - 0.6 g/dL 0.4   Beta 2 0.2 - 0.5 g/dL 0.3   Gamma Globulin 0.8 - 1.7 g/dL 0.8   Interpretation  --    G6PD Lab Results  Component Value Date   G6PDH 17.9 11/19/2017   TPMT No results found for: TPMT   Chest x-ray: 10/18/2005 Normal   Assessment/Plan:   Administrations This Visit     certolizumab pegol  (CIMZIA ) kit 400 mg     Admin Date 03/04/2024 Action Given Dose 400 mg Route Subcutaneous Documented By Cena Alfonso CROME, LPN             Patient tolerated injection well.   Appointment for next injection scheduled for 04/01/2024.  Patient due for labs today and were drawn in office.  Patient is to call and reschedule appointment if  running a fever with signs/symptoms of infection, on antibiotics for active infection or has an upcoming invasive procedure.  All questions encouraged and answered.  Instructed patient to call with any further questions or concerns.

## 2024-03-05 ENCOUNTER — Ambulatory Visit: Payer: Self-pay | Admitting: Physician Assistant

## 2024-03-05 NOTE — Progress Notes (Signed)
 CBC WNL

## 2024-03-30 NOTE — Progress Notes (Deleted)
 Office Visit Note  Patient: April Shepard             Date of Birth: Jul 04, 1940           MRN: [id number]             PCP: Gailen Olives, MD Referring: Gailen Olives, MD Visit Date: 04/13/2024 Occupation: Data Unavailable  Subjective:  No chief complaint on file.   History of Present Illness: April Shepard is a 83 y.o. female ***     Activities of Daily Living:  Patient reports morning stiffness for *** {minute/hour:19697}.   Patient {ACTIONS;DENIES/REPORTS:21021675::Denies} nocturnal pain.  Difficulty dressing/grooming: {ACTIONS;DENIES/REPORTS:21021675::Denies} Difficulty climbing stairs: {ACTIONS;DENIES/REPORTS:21021675::Denies} Difficulty getting out of chair: {ACTIONS;DENIES/REPORTS:21021675::Denies} Difficulty using hands for taps, buttons, cutlery, and/or writing: {ACTIONS;DENIES/REPORTS:21021675::Denies}  No Rheumatology ROS completed.   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   High risk medication use 10/22/2016   Trigger finger, right ring finger 10/22/2016   Osteopenia 05/13/2016   Osteoarthritis of both hands 05/13/2016   RA (rheumatoid arthritis) (HCC) 05/13/2016    Past Medical History:  Diagnosis Date   Osteoarthritis of both hands 05/13/2016   Osteopenia 05/13/2016   RA (rheumatoid arthritis) (HCC) 05/13/2016    Family History  Problem Relation Age of Onset   Breast cancer Mother    Heart attack Father    Melanoma Brother    Past Surgical History:  Procedure Laterality Date   CHOLECYSTECTOMY     Social History   Tobacco Use   Smoking status: Former    Current packs/day: 0.00    Types: Cigarettes     Quit date: 06/17/1988    Years since quitting: 35.8    Passive exposure: Past   Smokeless tobacco: Never  Vaping Use   Vaping status: Never Used  Substance Use Topics   Alcohol use: Yes    Comment: rare   Drug use: No   Social History   Social History Narrative   Not on file     Immunization History  Administered Date(s) Administered   INFLUENZA, HIGH DOSE SEASONAL PF 03/27/2018   Moderna Sars-Covid-2 Vaccination 08/17/2019, 09/14/2019, 05/08/2020     Objective: Vital Signs: There were no vitals taken for this visit.   Physical Exam   Musculoskeletal Exam: ***  CDAI Exam: CDAI Score: -- Patient Global: --; Provider Global: -- Swollen: --; Tender: -- Joint Exam 04/13/2024   No joint exam has been documented for this visit   There is currently no information documented on the homunculus. Go to the Rheumatology activity and complete the homunculus joint exam.  Investigation: No additional findings.  Imaging: No results found.  Recent Labs: Lab Results  Component Value Date   WBC 6.2 03/04/2024   HGB 12.6 03/04/2024   PLT 266 03/04/2024   NA 131 (L) 12/11/2023   K 4.5 12/11/2023   CL 96 (L) 12/11/2023   CO2 26 12/11/2023   GLUCOSE 82 12/11/2023   BUN 17 12/11/2023   CREATININE 0.71 12/11/2023   BILITOT 0.6 12/11/2023   ALKPHOS 59 05/14/2016   AST 24 12/11/2023   ALT 17 12/11/2023   PROT 6.7 12/11/2023   PROT 6.8 12/11/2023   ALBUMIN 4.5 05/14/2016   CALCIUM 9.7 12/11/2023  GFRAA 83 11/20/2020   QFTBGOLDPLUS NEGATIVE 04/03/2023    Speciality Comments: no baseline biologic labs  Procedures:  No procedures performed Allergies: Patient has no known allergies.   Assessment / Plan:     Visit Diagnoses: No diagnosis found.  Orders: No orders of the defined types were placed in this encounter.  No orders of the defined types were placed in this encounter.   Face-to-face time spent with patient was *** minutes. Greater than 50% of time was  spent in counseling and coordination of care.  Follow-Up Instructions: No follow-ups on file.   Daved JAYSON Gavel, CMA  Note - This record has been created using Animal nutritionist.  Chart creation errors have been sought, but may not always  have been located. Such creation errors do not reflect on  the standard of medical care.

## 2024-04-01 ENCOUNTER — Ambulatory Visit: Payer: MEDICARE | Attending: Rheumatology

## 2024-04-01 VITALS — BP 127/75 | HR 87 | Temp 97.1°F

## 2024-04-01 DIAGNOSIS — Z111 Encounter for screening for respiratory tuberculosis: Secondary | ICD-10-CM | POA: Diagnosis present

## 2024-04-01 DIAGNOSIS — M0609 Rheumatoid arthritis without rheumatoid factor, multiple sites: Secondary | ICD-10-CM | POA: Diagnosis present

## 2024-04-01 DIAGNOSIS — Z9225 Personal history of immunosupression therapy: Secondary | ICD-10-CM | POA: Diagnosis present

## 2024-04-01 MED ORDER — CERTOLIZUMAB PEGOL 2 X 200 MG ~~LOC~~ KIT
400.0000 mg | PACK | Freq: Once | SUBCUTANEOUS | Status: AC
  Administered 2024-04-01: 400 mg via SUBCUTANEOUS

## 2024-04-01 NOTE — Progress Notes (Signed)
 Subjective:   Patient presents to clinic today to receive monthly dose of Cimzia .  Patient running a fever or have signs/symptoms of infection? No  Patient currently on antibiotics for the treatment of infection? No  Patient have any upcoming invasive procedures/surgeries? No  Objective: CMP     Component Value Date/Time   NA 131 (L) 12/11/2023 0947   NA 138 02/20/2016 0000   K 4.5 12/11/2023 0947   CL 96 (L) 12/11/2023 0947   CO2 26 12/11/2023 0947   GLUCOSE 82 12/11/2023 0947   BUN 17 12/11/2023 0947   BUN 13 02/20/2016 0000   CREATININE 0.71 12/11/2023 0947   CALCIUM 9.7 12/11/2023 0947   PROT 6.7 12/11/2023 0947   PROT 6.8 12/11/2023 0947   ALBUMIN 4.5 05/14/2016 1314   AST 24 12/11/2023 0947   ALT 17 12/11/2023 0947   ALKPHOS 59 05/14/2016 1314   BILITOT 0.6 12/11/2023 0947   GFRNONAA 72 11/20/2020 1401   GFRAA 83 11/20/2020 1401    CBC    Component Value Date/Time   WBC 6.2 03/04/2024 1455   RBC 3.95 03/04/2024 1455   HGB 12.6 03/04/2024 1455   HCT 37.5 03/04/2024 1455   PLT 266 03/04/2024 1455   MCV 94.9 03/04/2024 1455   MCH 31.9 03/04/2024 1455   MCHC 33.6 03/04/2024 1455   RDW 13.0 03/04/2024 1455   LYMPHSABS 1,604 11/19/2022 1418   MONOABS 434 05/14/2016 1314   EOSABS 124 03/04/2024 1455   BASOSABS 62 03/04/2024 1455    Baseline Immunosuppressant Therapy Labs TB GOLD    Latest Ref Rng & Units 04/03/2023    2:18 PM  Quantiferon TB Gold  Quantiferon TB Gold Plus NEGATIVE NEGATIVE    Hepatitis Panel    Latest Ref Rng & Units 03/05/2018    1:53 PM  Hepatitis  Hep B Surface Ag NON-REACTI NON-REACTIVE   Hep B IgM NON-REACTI NON-REACTIVE   Hep C Ab NON-REACTI NON-REACTIVE    HIV Lab Results  Component Value Date   HIV NON-REACTIVE 03/05/2018   Immunoglobulins    Latest Ref Rng & Units 03/05/2018    1:53 PM  Immunoglobulin Electrophoresis  IgA  20 - 320 mg/dL 880   IgG 399 - 8,459 mg/dL 155   IgM 50 - 699 mg/dL 843    SPEP     Latest Ref Rng & Units 12/11/2023    9:47 AM  Serum Protein Electrophoresis  Total Protein 6.1 - 8.1 g/dL 6.1 - 8.1 g/dL 6.7    6.8   Albumin 3.8 - 4.8 g/dL 4.1   Alpha-1 0.2 - 0.3 g/dL 0.3   Alpha-2 0.5 - 0.9 g/dL 0.8   Beta Globulin 0.4 - 0.6 g/dL 0.4   Beta 2 0.2 - 0.5 g/dL 0.3   Gamma Globulin 0.8 - 1.7 g/dL 0.8   Interpretation  --    G6PD Lab Results  Component Value Date   G6PDH 17.9 11/19/2017   TPMT No results found for: TPMT   Chest x-ray: 10/18/2005 Normal   Assessment/Plan:   Administrations This Visit     certolizumab pegol  (CIMZIA ) kit 400 mg     Admin Date 04/01/2024 Action Given Dose 400 mg Route Subcutaneous Documented By Burl Francina HERO, CMA             Patient tolerated injection well.   Appointment for next injection scheduled for 04/29/2024.  Patient due for labs in December.  Patient is to call and reschedule appointment if running a fever with  signs/symptoms of infection, on antibiotics for active infection or has an upcoming invasive procedure.  All questions encouraged and answered.  Instructed patient to call with any further questions or concerns.

## 2024-04-02 ENCOUNTER — Telehealth: Payer: Self-pay | Admitting: Pharmacist

## 2024-04-02 NOTE — Telephone Encounter (Signed)
 Printed Tymlos  PAP renewal application to be completed at upcoming OV on 04/13/2024  Coyle Stordahl, PharmD, MPH, BCPS, CPP Clinical Pharmacist [facility])

## 2024-04-03 LAB — QUANTIFERON-TB GOLD PLUS
Mitogen-NIL: 8.25 [IU]/mL
NIL: 0.2 [IU]/mL
QuantiFERON-TB Gold Plus: NEGATIVE
TB1-NIL: <0 [IU]/mL
TB2-NIL: 0.01 [IU]/mL

## 2024-04-05 ENCOUNTER — Telehealth: Payer: Self-pay | Admitting: Pharmacist

## 2024-04-05 ENCOUNTER — Ambulatory Visit: Payer: Self-pay | Admitting: Rheumatology

## 2024-04-05 NOTE — Telephone Encounter (Signed)
 Called patient to re-verify benefits for in-office Cimzia . She states she is planning to keep her [employer] supplemental insurance plan. Patient opted into the annual reverification via Cimplicity portal   Sherry Pennant, PharmD, MPH, BCPS, CPP Clinical Pharmacist [facility])

## 2024-04-05 NOTE — Progress Notes (Signed)
A/P: Negative

## 2024-04-05 NOTE — Progress Notes (Signed)
TB Gold negative

## 2024-04-13 ENCOUNTER — Ambulatory Visit: Admitting: Rheumatology

## 2024-04-13 DIAGNOSIS — Z79899 Other long term (current) drug therapy: Secondary | ICD-10-CM

## 2024-04-13 DIAGNOSIS — Z8619 Personal history of other infectious and parasitic diseases: Secondary | ICD-10-CM

## 2024-04-13 DIAGNOSIS — Z8781 Personal history of (healed) traumatic fracture: Secondary | ICD-10-CM

## 2024-04-13 DIAGNOSIS — M19042 Primary osteoarthritis, left hand: Secondary | ICD-10-CM

## 2024-04-13 DIAGNOSIS — H35389 Toxic maculopathy, unspecified eye: Secondary | ICD-10-CM

## 2024-04-13 DIAGNOSIS — E785 Hyperlipidemia, unspecified: Secondary | ICD-10-CM

## 2024-04-13 DIAGNOSIS — M810 Age-related osteoporosis without current pathological fracture: Secondary | ICD-10-CM

## 2024-04-13 DIAGNOSIS — G8929 Other chronic pain: Secondary | ICD-10-CM

## 2024-04-13 DIAGNOSIS — R5383 Other fatigue: Secondary | ICD-10-CM

## 2024-04-13 DIAGNOSIS — M25511 Pain in right shoulder: Secondary | ICD-10-CM

## 2024-04-13 DIAGNOSIS — M19071 Primary osteoarthritis, right ankle and foot: Secondary | ICD-10-CM

## 2024-04-13 DIAGNOSIS — S92355S Nondisplaced fracture of fifth metatarsal bone, left foot, sequela: Secondary | ICD-10-CM

## 2024-04-13 DIAGNOSIS — M0609 Rheumatoid arthritis without rheumatoid factor, multiple sites: Secondary | ICD-10-CM

## 2024-04-13 DIAGNOSIS — E559 Vitamin D deficiency, unspecified: Secondary | ICD-10-CM

## 2024-04-14 NOTE — Telephone Encounter (Signed)
 Appt r/s to 05/07/24

## 2024-04-23 NOTE — Progress Notes (Signed)
 Office Visit Note  Patient: April Shepard             Date of Birth: [birth year]           MRN: [id number]             PCP: Gailen Olives, MD Referring: Gailen Olives, MD Visit Date: 05/07/2024 Occupation: Data Unavailable  Subjective:  Medication management  History of Present Illness: April Shepard is a 83 y.o. female with rheumatoid arthritis and osteoporosis.  She returns today after her last visit in June 2025.  She has not had any flares of rheumatoid arthritis.  She is on Cimzia  400 mg subcu injections every 28 days and methotrexate  5 tablets weekly along with folic acid  1 mg daily.  She had no interruption in the treatment.  She finished physical therapy for her shoulder and it is feeling better. She has been on Tymlos  subcutaneous daily injection since January 13, 2024 for osteoporosis.  She is currently not taking calcium and vitamin D .  She walks for exercise.     Activities of Daily Living:  Patient reports morning stiffness for 0 minutes.   Patient Reports nocturnal pain.  Difficulty dressing/grooming: Denies Difficulty climbing stairs: Denies Difficulty getting out of chair: Denies Difficulty using hands for taps, buttons, cutlery, and/or writing: Denies  Review of Systems  Constitutional:  Negative for fatigue.  HENT:  Negative for mouth sores and mouth dryness.   Eyes:  Negative for dryness.  Respiratory:  Negative for shortness of breath.   Cardiovascular:  Negative for chest pain and palpitations.  Gastrointestinal:  Negative for blood in stool, constipation and diarrhea.  Endocrine: Positive for increased urination.  Genitourinary:  Negative for involuntary urination.  Musculoskeletal:  Positive for muscle weakness and muscle tenderness. Negative for joint pain, gait problem, joint pain, joint swelling, myalgias, morning stiffness and myalgias.  Skin:  Positive for hair loss. Negative for color change, rash and sensitivity to sunlight.   Allergic/Immunologic: Negative for susceptible to infections.  Neurological:  Negative for dizziness and headaches.  Hematological:  Negative for swollen glands.  Psychiatric/Behavioral:  Negative for depressed mood and sleep disturbance. The patient is not nervous/anxious.     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   High risk medication use 10/22/2016   Trigger finger, right ring finger 10/22/2016   Osteopenia 05/13/2016   Osteoarthritis of both hands 05/13/2016   RA (rheumatoid arthritis) (HCC) 05/13/2016    Past Medical History:  Diagnosis Date   Osteoarthritis of both hands 05/13/2016   Osteopenia 05/13/2016   Osteoporosis    RA (rheumatoid arthritis) (HCC) 05/13/2016    Family History  Problem Relation Age of Onset   Breast cancer Mother    Heart attack Father    Melanoma Brother    Past Surgical History:  Procedure Laterality Date   CHOLECYSTECTOMY     Social History   Tobacco Use   Smoking status: Former    Current packs/day: 0.00    Types: Cigarettes    Quit date: 06/17/1988    Years since quitting: 35.9    Passive exposure: Past   Smokeless tobacco: Never  Vaping Use   Vaping status: Never Used  Substance Use Topics  Alcohol use: Yes    Comment: rare   Drug use: No   Social History   Social History Narrative   Not on file     Immunization History  Administered Date(s) Administered   INFLUENZA, HIGH DOSE SEASONAL PF 03/27/2018   Moderna Sars-Covid-2 Vaccination 08/17/2019, 09/14/2019, 05/08/2020     Objective: Vital Signs: BP 135/74 (BP Location: Left Arm, Patient Position: Sitting, Cuff Size: Small)   Pulse 77   Temp 97.7 F (36.5  C)   Resp 12   Ht 5' 2 (1.575 m)   Wt 126 lb (57.2 kg)   BMI 23.05 kg/m    Physical Exam Vitals and nursing note reviewed.  Constitutional:      Appearance: She is well-developed.  HENT:     Head: Normocephalic and atraumatic.  Eyes:     Conjunctiva/sclera: Conjunctivae normal.  Cardiovascular:     Rate and Rhythm: Normal rate and regular rhythm.     Heart sounds: Normal heart sounds.  Pulmonary:     Effort: Pulmonary effort is normal.     Breath sounds: Normal breath sounds.  Abdominal:     General: Bowel sounds are normal.     Palpations: Abdomen is soft.  Musculoskeletal:     Cervical back: Normal range of motion.  Lymphadenopathy:     Cervical: No cervical adenopathy.  Skin:    General: Skin is warm and dry.     Capillary Refill: Capillary refill takes less than 2 seconds.  Neurological:     Mental Status: She is alert and oriented to person, place, and time.  Psychiatric:        Behavior: Behavior normal.      Musculoskeletal Exam: She had good range of motion of the cervical spine.  Thoracic kyphosis was noted without any tenderness.  She had no tenderness over lumbar spine.  She had good range of motion of bilateral shoulders without any discomfort.  Elbow joints and wrist joints in good range of motion.  Bilateral CMC, PIP and DIP thickening was noted.  Bilateral MCP thickening was noted.  Hip joints and knee joints in good range of motion.  Bilateral bunions were noted.  There was no tenderness over ankles or MTPs.  CDAI Exam: CDAI Score: -- Patient Global: 0 / 100; Provider Global: 0 / 100 Swollen: --; Tender: -- Joint Exam 05/07/2024   No joint exam has been documented for this visit   There is currently no information documented on the homunculus. Go to the Rheumatology activity and complete the homunculus joint exam.  Investigation: No additional findings.  Imaging: No results found.  Recent Labs: Lab Results  Component Value Date   WBC 6.2  03/04/2024   HGB 12.6 03/04/2024   PLT 266 03/04/2024   NA 131 (L) 12/11/2023   K 4.5 12/11/2023   CL 96 (L) 12/11/2023   CO2 26 12/11/2023   GLUCOSE 82 12/11/2023   BUN 17 12/11/2023   CREATININE 0.71 12/11/2023   BILITOT 0.6 12/11/2023   ALKPHOS 59 05/14/2016   AST 24 12/11/2023   ALT 17 12/11/2023   PROT 6.7 12/11/2023   PROT 6.8 12/11/2023   ALBUMIN 4.5 05/14/2016   CALCIUM 9.7 12/11/2023   GFRAA 83 11/20/2020   QFTBGOLDPLUS NEGATIVE 04/01/2024    Speciality Comments: no baseline biologic labs Tymlos  started January 13, 2024  Procedures:  No procedures performed Allergies: Patient has no known allergies.   Assessment / Plan:     Visit Diagnoses: Rheumatoid arthritis of  multiple sites with negative rheumatoid factor (HCC)-patient denies having a flare of rheumatoid arthritis.  She has been taking Cimzia  400 mg subcu every 28 days and methotrexate  5 tablets weekly along with folic acid  without any interruption.  She had no synovitis on the examination.  She continues to have synovial thickening over her MCP joints.  High risk medication use - Cimzia  400 mg sq injection every 28 days, methotrexate  2.5 mg, 5 tablets every 7 days, and folic acid  1 mg, 1 tablet p.o. daily. -Will check CBC and CMP today.  TB Gold was negative in October 2025.  Will recheck TB Gold again in 1 year.  Plan: CBC with Differential/Platelet, Comprehensive metabolic panel with GFR.  CBC and CMP were normal on December 11, 2023.  Information on immunization was placed in the AVS.  Patient was advised to hold Cimzia  if she develops an infection and resume after the infection resolves.  Annual skin examination to screen for skin cancer was advised.  Use of sunscreen and sun protection was discussed.  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  Chronic pain of right knee -denies discomfort today.  She had a cortisone injection performed on 11/06/2023 which has provided significant relief.  Primary osteoarthritis of both feet-she has bilateral bunions and PIP and DIP thickening.  No synovitis was noted.  Age-related osteoporosis without current pathological fracture - DEXA 11/25/2023: Left total hip BMD 0.  619 and T-score -2.1.  Lumbar T-score -1.1.  Right hip total BMD 0.591 with T-score -3.3.  She was treated with alendronate in the past.  She was a started on Tymlos  in July 2025.  She has been taking daily injections without any side effects.  Vitamin D  deficiency-vitamin D  was 77 on December 11, 2023.  History of pelvic fracture - 01/08/2023 diagnosed with pelvic fracture.  Closed nondisplaced fracture of fifth metatarsal bone of left foot, sequela  Other fatigue  Dyslipidemia - Plan: Lipid panel  History of Clostridium difficile infection  Orders: Orders Placed This Encounter  Procedures   CBC with Differential/Platelet   Comprehensive metabolic panel with GFR   Lipid panel   No orders of the defined types were placed in this encounter.    Follow-Up Instructions: Return in about 5 months (around 10/05/2024) for Rheumatoid arthritis, Osteoporosis, Osteoarthritis.   Maya Nash, MD  Note - This record has been created using Animal nutritionist.  Chart creation errors have been sought, but may not always  have been located. Such creation errors do not reflect on  the standard of medical care.

## 2024-04-24 ENCOUNTER — Other Ambulatory Visit: Payer: Self-pay | Admitting: Physician Assistant

## 2024-04-26 NOTE — Telephone Encounter (Signed)
 Last Fill: 12/11/2023  Labs: 02/10/2024 CMP Chloride 97 Alkaline Phosphatase 210 BUN 25 Creatinine 0.6  03/04/2024 CBC WNL   Next Visit: 05/07/2024  Last Visit: 12/11/2023  DX: Rheumatoid arthritis of multiple sites with negative rheumatoid factor (HCC)   Current Dose per office note 12/11/2023: methotrexate  2.5 mg, 5 tablets every 7 days   Patient to update CMP at upcomming follow up. Standing order already in order review.   Okay to refill Methotrexate ?

## 2024-04-29 ENCOUNTER — Ambulatory Visit: Payer: MEDICARE | Attending: Rheumatology | Admitting: *Deleted

## 2024-04-29 VITALS — BP 131/70 | HR 84

## 2024-04-29 DIAGNOSIS — M0609 Rheumatoid arthritis without rheumatoid factor, multiple sites: Secondary | ICD-10-CM | POA: Insufficient documentation

## 2024-04-29 MED ORDER — CERTOLIZUMAB PEGOL 2 X 200 MG ~~LOC~~ KIT
400.0000 mg | PACK | Freq: Once | SUBCUTANEOUS | Status: AC
  Administered 2024-04-29: 400 mg via SUBCUTANEOUS

## 2024-04-29 NOTE — Progress Notes (Signed)
 Subjective:   Patient presents to clinic today to receive monthly dose of Cimzia .  Patient running a fever or have signs/symptoms of infection? No  Patient currently on antibiotics for the treatment of infection? No  Patient have any upcoming invasive procedures/surgeries? No  Objective: CMP     Component Value Date/Time   NA 131 (L) 12/11/2023 0947   NA 138 02/20/2016 0000   K 4.5 12/11/2023 0947   CL 96 (L) 12/11/2023 0947   CO2 26 12/11/2023 0947   GLUCOSE 82 12/11/2023 0947   BUN 17 12/11/2023 0947   BUN 13 02/20/2016 0000   CREATININE 0.71 12/11/2023 0947   CALCIUM 9.7 12/11/2023 0947   PROT 6.7 12/11/2023 0947   PROT 6.8 12/11/2023 0947   ALBUMIN 4.5 05/14/2016 1314   AST 24 12/11/2023 0947   ALT 17 12/11/2023 0947   ALKPHOS 59 05/14/2016 1314   BILITOT 0.6 12/11/2023 0947   GFRNONAA 72 11/20/2020 1401   GFRAA 83 11/20/2020 1401    CBC    Component Value Date/Time   WBC 6.2 03/04/2024 1455   RBC 3.95 03/04/2024 1455   HGB 12.6 03/04/2024 1455   HCT 37.5 03/04/2024 1455   PLT 266 03/04/2024 1455   MCV 94.9 03/04/2024 1455   MCH 31.9 03/04/2024 1455   MCHC 33.6 03/04/2024 1455   RDW 13.0 03/04/2024 1455   LYMPHSABS 1,604 11/19/2022 1418   MONOABS 434 05/14/2016 1314   EOSABS 124 03/04/2024 1455   BASOSABS 62 03/04/2024 1455    Baseline Immunosuppressant Therapy Labs TB GOLD    Latest Ref Rng & Units 04/01/2024    2:42 PM  Quantiferon TB Gold  Quantiferon TB Gold Plus NEGATIVE NEGATIVE    Hepatitis Panel    Latest Ref Rng & Units 03/05/2018    1:53 PM  Hepatitis  Hep B Surface Ag NON-REACTI NON-REACTIVE   Hep B IgM NON-REACTI NON-REACTIVE   Hep C Ab NON-REACTI NON-REACTIVE    HIV Lab Results  Component Value Date   HIV NON-REACTIVE 03/05/2018   Immunoglobulins    Latest Ref Rng & Units 03/05/2018    1:53 PM  Immunoglobulin Electrophoresis  IgA  20 - 320 mg/dL 880   IgG 399 - 8,459 mg/dL 155   IgM 50 - 699 mg/dL 843    SPEP     Latest Ref Rng & Units 12/11/2023    9:47 AM  Serum Protein Electrophoresis  Total Protein 6.1 - 8.1 g/dL 6.1 - 8.1 g/dL 6.7    6.8   Albumin 3.8 - 4.8 g/dL 4.1   Alpha-1 0.2 - 0.3 g/dL 0.3   Alpha-2 0.5 - 0.9 g/dL 0.8   Beta Globulin 0.4 - 0.6 g/dL 0.4   Beta 2 0.2 - 0.5 g/dL 0.3   Gamma Globulin 0.8 - 1.7 g/dL 0.8   Interpretation  --    G6PD Lab Results  Component Value Date   G6PDH 17.9 11/19/2017   TPMT No results found for: TPMT   Chest x-ray:  10/18/2005 Normal   Assessment/Plan:   Administrations This Visit     certolizumab pegol  (CIMZIA ) kit 400 mg     Admin Date 04/29/2024 Action Given Dose 400 mg Route Subcutaneous Documented By Cena Alfonso CROME, LPN             Patient tolerated injection well.   Appointment for next injection scheduled for 05/27/2024.  Patient due for labs in December 2025.  Patient is to call and reschedule appointment if running a  fever with signs/symptoms of infection, on antibiotics for active infection or has an upcoming invasive procedure.  All questions encouraged and answered.  Instructed patient to call with any further questions or concerns.

## 2024-05-07 ENCOUNTER — Encounter: Payer: Self-pay | Admitting: Rheumatology

## 2024-05-07 ENCOUNTER — Ambulatory Visit: Payer: MEDICARE | Attending: Rheumatology | Admitting: Rheumatology

## 2024-05-07 VITALS — BP 135/74 | HR 77 | Temp 97.7°F | Resp 12 | Ht 62.0 in | Wt 126.0 lb

## 2024-05-07 DIAGNOSIS — E559 Vitamin D deficiency, unspecified: Secondary | ICD-10-CM | POA: Diagnosis present

## 2024-05-07 DIAGNOSIS — E785 Hyperlipidemia, unspecified: Secondary | ICD-10-CM | POA: Insufficient documentation

## 2024-05-07 DIAGNOSIS — M25511 Pain in right shoulder: Secondary | ICD-10-CM | POA: Diagnosis present

## 2024-05-07 DIAGNOSIS — R5383 Other fatigue: Secondary | ICD-10-CM | POA: Insufficient documentation

## 2024-05-07 DIAGNOSIS — G8929 Other chronic pain: Secondary | ICD-10-CM | POA: Insufficient documentation

## 2024-05-07 DIAGNOSIS — S92355S Nondisplaced fracture of fifth metatarsal bone, left foot, sequela: Secondary | ICD-10-CM | POA: Diagnosis present

## 2024-05-07 DIAGNOSIS — T372X5A Adverse effect of antimalarials and drugs acting on other blood protozoa, initial encounter: Secondary | ICD-10-CM | POA: Insufficient documentation

## 2024-05-07 DIAGNOSIS — Z8619 Personal history of other infectious and parasitic diseases: Secondary | ICD-10-CM | POA: Diagnosis present

## 2024-05-07 DIAGNOSIS — M19071 Primary osteoarthritis, right ankle and foot: Secondary | ICD-10-CM | POA: Diagnosis present

## 2024-05-07 DIAGNOSIS — M0609 Rheumatoid arthritis without rheumatoid factor, multiple sites: Secondary | ICD-10-CM | POA: Insufficient documentation

## 2024-05-07 DIAGNOSIS — H35389 Toxic maculopathy, unspecified eye: Secondary | ICD-10-CM | POA: Insufficient documentation

## 2024-05-07 DIAGNOSIS — Z79899 Other long term (current) drug therapy: Secondary | ICD-10-CM | POA: Insufficient documentation

## 2024-05-07 DIAGNOSIS — M19041 Primary osteoarthritis, right hand: Secondary | ICD-10-CM | POA: Insufficient documentation

## 2024-05-07 DIAGNOSIS — M25561 Pain in right knee: Secondary | ICD-10-CM | POA: Diagnosis present

## 2024-05-07 DIAGNOSIS — M19042 Primary osteoarthritis, left hand: Secondary | ICD-10-CM | POA: Insufficient documentation

## 2024-05-07 DIAGNOSIS — M810 Age-related osteoporosis without current pathological fracture: Secondary | ICD-10-CM | POA: Diagnosis present

## 2024-05-07 DIAGNOSIS — M19072 Primary osteoarthritis, left ankle and foot: Secondary | ICD-10-CM | POA: Insufficient documentation

## 2024-05-07 DIAGNOSIS — Z8781 Personal history of (healed) traumatic fracture: Secondary | ICD-10-CM | POA: Insufficient documentation

## 2024-05-07 LAB — COMPREHENSIVE METABOLIC PANEL WITH GFR
AG Ratio: 1.8 (calc) (ref 1.0–2.5)
ALT: 15 U/L (ref 6–29)
AST: 26 U/L (ref 10–35)
Albumin: 4.2 g/dL (ref 3.6–5.1)
Alkaline phosphatase (APISO): 131 U/L (ref 37–153)
BUN/Creatinine Ratio: 42 (calc) — ABNORMAL HIGH (ref 6–22)
BUN: 27 mg/dL — ABNORMAL HIGH (ref 7–25)
CO2: 25 mmol/L (ref 20–32)
Calcium: 9.8 mg/dL (ref 8.6–10.4)
Chloride: 102 mmol/L (ref 98–110)
Creat: 0.65 mg/dL (ref 0.60–0.95)
Globulin: 2.4 g/dL (ref 1.9–3.7)
Glucose, Bld: 84 mg/dL (ref 65–99)
Potassium: 4.6 mmol/L (ref 3.5–5.3)
Sodium: 137 mmol/L (ref 135–146)
Total Bilirubin: 0.4 mg/dL (ref 0.2–1.2)
Total Protein: 6.6 g/dL (ref 6.1–8.1)
eGFR: 87 mL/min/1.73m2 (ref >=60)

## 2024-05-07 LAB — CBC WITH DIFFERENTIAL/PLATELET
Absolute Lymphocytes: 1940 {cells}/uL (ref 850–3900)
Absolute Monocytes: 508 {cells}/uL (ref 200–950)
Basophils Absolute: 53 {cells}/uL (ref 0–200)
Basophils Relative: 0.8 %
Eosinophils Absolute: 73 {cells}/uL (ref 15–500)
Eosinophils Relative: 1.1 %
HCT: 39.5 % (ref 35.0–45.0)
Hemoglobin: 12.9 g/dL (ref 11.7–15.5)
MCH: 30.8 pg (ref 27.0–33.0)
MCHC: 32.7 g/dL (ref 32.0–36.0)
MCV: 94.3 fL (ref 80.0–100.0)
MPV: 9 fL (ref 7.5–12.5)
Monocytes Relative: 7.7 %
Neutro Abs: 4026 {cells}/uL (ref 1500–7800)
Neutrophils Relative %: 61 %
Platelets: 271 Thousand/uL (ref 140–400)
RBC: 4.19 Million/uL (ref 3.80–5.10)
RDW: 13.1 % (ref 11.0–15.0)
Total Lymphocyte: 29.4 %
WBC: 6.6 Thousand/uL (ref 3.8–10.8)

## 2024-05-07 LAB — LIPID PANEL
Cholesterol: 133 mg/dL (ref <200)
HDL: 73 mg/dL (ref >=50)
LDL Cholesterol (Calc): 44 mg/dL
Non-HDL Cholesterol (Calc): 60 mg/dL (ref <130)
Total CHOL/HDL Ratio: 1.8 (calc) (ref <5.0)
Triglycerides: 77 mg/dL (ref <150)

## 2024-05-07 NOTE — Patient Instructions (Addendum)
 Standing Labs We placed an order today for your standing lab work.   Please have your standing labs drawn in February every 3 months  Please have your labs drawn 2 weeks prior to your appointment so that the provider can discuss your lab results at your appointment, if possible.  Please note that you may see your imaging and lab results in MyChart before we have reviewed them. We will contact you once all results are reviewed. Please allow our office up to 72 hours to thoroughly review all of the results before contacting the office for clarification of your results.  WALK-IN LAB HOURS  Monday through Thursday from 8:00 am - 4:30 pm and Friday from 8:00 am-12:00 pm.  Patients with office visits requiring labs will be seen before walk-in labs.  You may encounter longer than normal wait times. Please allow additional time. Wait times may be shorter on  Monday and Thursday afternoons.  We do not book appointments for walk-in labs. We appreciate your patience and understanding with our staff.   Labs are drawn by [facility]. Please bring your co-pay at the time of your lab draw.  You may receive a bill from [facility] for your lab work.  Please note if you are on Hydroxychloroquine and and an order has been placed for a Hydroxychloroquine level,  you will need to have it drawn 4 hours or more after your last dose.  If you wish to have your labs drawn at another location, please call the office 24 hours in advance so we can fax the orders.  The office is located at [address], [place], [place] [address]   If you have any questions regarding directions or hours of operation,  please call [phone].   As a reminder, please drink plenty of water prior to coming for your lab work. Thanks!   Vaccines You are taking a medication(s) that can suppress your immune system.  The following immunizations are recommended: Flu annually RSV Covid-19  Td/Tdap (tetanus, diphtheria, pertussis)  every 10 years Pneumonia (Prevnar 15 then Pneumovax 23 at least 1 year apart.  Alternatively, can take Prevnar 20 without needing additional dose) Shingrix: 2 doses from 4 weeks to 6 months apart  Please check with your PCP to make sure you are up to date.   If you have signs or symptoms of an infection or start antibiotics: First, call your PCP for workup of your infection. Hold your medication through the infection, until you complete your antibiotics, and until symptoms resolve if you take the following: Injectable medication (Actemra, Benlysta, Cimzia , Cosentyx, Enbrel, Humira, Kevzara, Orencia, Remicade, Simponi, Stelara, Taltz, Tremfya) Methotrexate  Leflunomide (Arava) Mycophenolate (Cellcept) Earma, Olumiant, or Rinvoq   Please get an annual skin examination to screen for skin cancer while you are on Cimzia .  Please use sunscreen and sun protection.

## 2024-05-09 ENCOUNTER — Ambulatory Visit: Payer: Self-pay | Admitting: Rheumatology

## 2024-05-09 NOTE — Progress Notes (Signed)
 CBC and CMP are stable, lipid panel is within normal limits.  Please forward results to her PCP.

## 2024-05-10 NOTE — Telephone Encounter (Signed)
 Prescriber form placed in Dr. Jammie folder for signature  Remaining PAP application prepped  Loranzo Desha, PharmD, MPH, BCPS, CPP Clinical Pharmacist [facility])

## 2024-05-11 NOTE — Telephone Encounter (Signed)
 Received signed provider forms. Submitted Patient Assistance RENEWAL Application to [employer] for TYMLOS  with patient portion, provider portion, insurance card copy, prior authorization approval, current medication list, and income documents. Will update patient when we receive a response.  Fax: [phone] Phone: [phone]  Sherry Pennant, PharmD, MPH, BCPS, CPP Clinical Pharmacist [facility])

## 2024-05-27 ENCOUNTER — Ambulatory Visit: Payer: MEDICARE

## 2024-05-27 NOTE — Telephone Encounter (Addendum)
 Called [employer] for Tymlos  PAP renewal update. Per rep, she has been approved through 06/17/2024 and 06/16/2025. They have not sent approvals - they are batch faxing approval letters  Phone: [phone]  Sherry Pennant, PharmD, MPH, BCPS, CPP Clinical Pharmacist

## 2024-06-03 ENCOUNTER — Ambulatory Visit: Payer: MEDICARE

## 2024-06-03 VITALS — BP 120/80 | HR 79 | Temp 97.4°F

## 2024-06-03 DIAGNOSIS — M0609 Rheumatoid arthritis without rheumatoid factor, multiple sites: Secondary | ICD-10-CM | POA: Diagnosis present

## 2024-06-03 MED ORDER — CERTOLIZUMAB PEGOL 2 X 200 MG ~~LOC~~ KIT
400.0000 mg | PACK | Freq: Once | SUBCUTANEOUS | Status: AC
  Administered 2024-06-03: 15:00:00 400 mg via SUBCUTANEOUS

## 2024-06-03 NOTE — Progress Notes (Signed)
 Subjective:   Patient presents to clinic today to receive monthly dose of Cimzia .  Patient running a fever or have signs/symptoms of infection? No  Patient currently on antibiotics for the treatment of infection? No  Patient have any upcoming invasive procedures/surgeries? No  Objective: CMP     Component Value Date/Time   NA 137 05/07/2024 1211   NA 138 02/20/2016 0000   K 4.6 05/07/2024 1211   CL 102 05/07/2024 1211   CO2 25 05/07/2024 1211   GLUCOSE 84 05/07/2024 1211   BUN 27 (H) 05/07/2024 1211   BUN 13 02/20/2016 0000   CREATININE 0.65 05/07/2024 1211   CALCIUM 9.8 05/07/2024 1211   PROT 6.6 05/07/2024 1211   ALBUMIN 4.5 05/14/2016 1314   AST 26 05/07/2024 1211   ALT 15 05/07/2024 1211   ALKPHOS 59 05/14/2016 1314   BILITOT 0.4 05/07/2024 1211   GFRNONAA 72 11/20/2020 1401   GFRAA 83 11/20/2020 1401    CBC    Component Value Date/Time   WBC 6.6 05/07/2024 1211   RBC 4.19 05/07/2024 1211   HGB 12.9 05/07/2024 1211   HCT 39.5 05/07/2024 1211   PLT 271 05/07/2024 1211   MCV 94.3 05/07/2024 1211   MCH 30.8 05/07/2024 1211   MCHC 32.7 05/07/2024 1211   RDW 13.1 05/07/2024 1211   LYMPHSABS 1,604 11/19/2022 1418   MONOABS 434 05/14/2016 1314   EOSABS 73 05/07/2024 1211   BASOSABS 53 05/07/2024 1211    Baseline Immunosuppressant Therapy Labs TB GOLD    Latest Ref Rng & Units 04/01/2024    2:42 PM  Quantiferon TB Gold  Quantiferon TB Gold Plus NEGATIVE NEGATIVE    Hepatitis Panel    Latest Ref Rng & Units 03/05/2018    1:53 PM  Hepatitis  Hep B Surface Ag NON-REACTI NON-REACTIVE   Hep B IgM NON-REACTI NON-REACTIVE   Hep C Ab NON-REACTI NON-REACTIVE    HIV Lab Results  Component Value Date   HIV NON-REACTIVE 03/05/2018   Immunoglobulins    Latest Ref Rng & Units 03/05/2018    1:53 PM  Immunoglobulin Electrophoresis  IgA  20 - 320 mg/dL 880   IgG 399 - 8,459 mg/dL 155   IgM 50 - 699 mg/dL 843    SPEP    Latest Ref Rng & Units 05/07/2024    12:11 PM  Serum Protein Electrophoresis  Total Protein 6.1 - 8.1 g/dL 6.6    H3EI Lab Results  Component Value Date   G6PDH 17.9 11/19/2017   TPMT No results found for: TPMT   Chest x-ray: 10/18/2005 Normal   Assessment/Plan:   Administrations This Visit     certolizumab pegol  (CIMZIA ) kit 400 mg     Admin Date 06/03/2024 Action Given Dose 400 mg Route Subcutaneous Documented By Burl Francina HERO, CMA            Patient tolerated injection well.   Appointment for next injection scheduled for July 01, 2024.  Patient due for labs in February.  Patient is to call and reschedule appointment if running a fever with signs/symptoms of infection, on antibiotics for active infection or has an upcoming invasive procedure.  All questions encouraged and answered.  Instructed patient to call with any further questions or concerns.

## 2024-06-23 NOTE — Telephone Encounter (Signed)
 Received fax for Cimzia  reverification of benefits   Patient has Medicare [place]. Medicare will cover 80% of cost with 20& coinsurance. [employer] [place] will cover [phone] as long as Medicare pays. Supplemental plan is active. 20% coinsurance will be paid by [facility] [place] after [place]ible is met   CPT codes 03627 and [phone] are valid and billable.   Case # [id number]   Sherry Pennant, PharmD, MPH, BCPS, CPP Clinical Pharmacist

## 2024-07-01 ENCOUNTER — Ambulatory Visit: Payer: MEDICARE | Attending: Rheumatology | Admitting: *Deleted

## 2024-07-01 VITALS — BP 124/70 | HR 102

## 2024-07-01 DIAGNOSIS — M0609 Rheumatoid arthritis without rheumatoid factor, multiple sites: Secondary | ICD-10-CM | POA: Diagnosis present

## 2024-07-01 MED ORDER — CERTOLIZUMAB PEGOL 2 X 200 MG ~~LOC~~ KIT
400.0000 mg | PACK | Freq: Once | SUBCUTANEOUS | Status: AC
  Administered 2024-07-01: 400 mg via SUBCUTANEOUS

## 2024-07-01 NOTE — Progress Notes (Signed)
 Subjective:   Patient presents to clinic today to receive monthly dose of Cimzia .  Patient running a fever or have signs/symptoms of infection? No  Patient currently on antibiotics for the treatment of infection? No  Patient have any upcoming invasive procedures/surgeries? No  Objective: CMP     Component Value Date/Time   NA 137 05/07/2024 1211   NA 138 02/20/2016 0000   K 4.6 05/07/2024 1211   CL 102 05/07/2024 1211   CO2 25 05/07/2024 1211   GLUCOSE 84 05/07/2024 1211   BUN 27 (H) 05/07/2024 1211   BUN 13 02/20/2016 0000   CREATININE 0.65 05/07/2024 1211   CALCIUM 9.8 05/07/2024 1211   PROT 6.6 05/07/2024 1211   ALBUMIN 4.5 05/14/2016 1314   AST 26 05/07/2024 1211   ALT 15 05/07/2024 1211   ALKPHOS 59 05/14/2016 1314   BILITOT 0.4 05/07/2024 1211   GFRNONAA 72 11/20/2020 1401   GFRAA 83 11/20/2020 1401    CBC    Component Value Date/Time   WBC 6.6 05/07/2024 1211   RBC 4.19 05/07/2024 1211   HGB 12.9 05/07/2024 1211   HCT 39.5 05/07/2024 1211   PLT 271 05/07/2024 1211   MCV 94.3 05/07/2024 1211   MCH 30.8 05/07/2024 1211   MCHC 32.7 05/07/2024 1211   RDW 13.1 05/07/2024 1211   LYMPHSABS 1,604 11/19/2022 1418   MONOABS 434 05/14/2016 1314   EOSABS 73 05/07/2024 1211   BASOSABS 53 05/07/2024 1211    Baseline Immunosuppressant Therapy Labs TB GOLD    Latest Ref Rng & Units 04/01/2024    2:42 PM  Quantiferon TB Gold  Quantiferon TB Gold Plus NEGATIVE NEGATIVE    Hepatitis Panel    Latest Ref Rng & Units 03/05/2018    1:53 PM  Hepatitis  Hep B Surface Ag NON-REACTI NON-REACTIVE   Hep B IgM NON-REACTI NON-REACTIVE   Hep C Ab NON-REACTI NON-REACTIVE    HIV Lab Results  Component Value Date   HIV NON-REACTIVE 03/05/2018   Immunoglobulins    Latest Ref Rng & Units 03/05/2018    1:53 PM  Immunoglobulin Electrophoresis  IgA  20 - 320 mg/dL 880   IgG 399 - 8,459 mg/dL 155   IgM 50 - 699 mg/dL 843    SPEP    Latest Ref Rng & Units 05/07/2024    12:11 PM  Serum Protein Electrophoresis  Total Protein 6.1 - 8.1 g/dL 6.6    H3EI Lab Results  Component Value Date   G6PDH 17.9 11/19/2017   TPMT No results found for: TPMT   Chest x-ray: 10/18/2005 Normal   Assessment/Plan:   Administrations This Visit     certolizumab pegol  (CIMZIA ) kit 400 mg     Admin Date 07/01/2024 Action Given Dose 400 mg Route Subcutaneous Documented By Cena Alfonso CROME, LPN             Patient tolerated injection well.   Appointment for next injection scheduled for 07/29/2024.  Patient due for labs in February 2026.  Patient is to call and reschedule appointment if running a fever with signs/symptoms of infection, on antibiotics for active infection or has an upcoming invasive procedure.  All questions encouraged and answered.  Instructed patient to call with any further questions or concerns.

## 2024-07-29 ENCOUNTER — Ambulatory Visit: Payer: MEDICARE

## 2024-10-13 ENCOUNTER — Ambulatory Visit: Payer: MEDICARE | Admitting: Rheumatology
